# Patient Record
Sex: Male | Born: 1975 | Race: White | Hispanic: No | Marital: Single
Health system: Southern US, Academic
[De-identification: ages and names within clinical notes are randomized; demographics above are authoritative.]

## PROBLEM LIST (undated history)

## (undated) ENCOUNTER — Encounter
Payer: MEDICARE | Attending: Student in an Organized Health Care Education/Training Program | Primary: Student in an Organized Health Care Education/Training Program

## (undated) ENCOUNTER — Other Ambulatory Visit: Attending: Clinical | Primary: Clinical

## (undated) ENCOUNTER — Telehealth: Attending: Social Worker | Primary: Social Worker

## (undated) ENCOUNTER — Encounter

## (undated) ENCOUNTER — Ambulatory Visit: Payer: Medicare (Managed Care)

## (undated) ENCOUNTER — Ambulatory Visit: Attending: Social Worker | Primary: Social Worker

## (undated) ENCOUNTER — Ambulatory Visit
Payer: MEDICARE | Attending: Student in an Organized Health Care Education/Training Program | Primary: Student in an Organized Health Care Education/Training Program

## (undated) ENCOUNTER — Encounter
Attending: Student in an Organized Health Care Education/Training Program | Primary: Student in an Organized Health Care Education/Training Program

## (undated) ENCOUNTER — Ambulatory Visit

## (undated) ENCOUNTER — Telehealth

## (undated) ENCOUNTER — Encounter
Attending: Pharmacist Clinician (PhC)/ Clinical Pharmacy Specialist | Primary: Pharmacist Clinician (PhC)/ Clinical Pharmacy Specialist

## (undated) ENCOUNTER — Ambulatory Visit
Payer: Medicare (Managed Care) | Attending: Student in an Organized Health Care Education/Training Program | Primary: Student in an Organized Health Care Education/Training Program

## (undated) ENCOUNTER — Ambulatory Visit: Payer: MEDICARE

## (undated) ENCOUNTER — Telehealth: Attending: Clinical | Primary: Clinical

## (undated) ENCOUNTER — Telehealth
Attending: Student in an Organized Health Care Education/Training Program | Primary: Student in an Organized Health Care Education/Training Program

## (undated) ENCOUNTER — Ambulatory Visit: Attending: Clinical | Primary: Clinical

## (undated) ENCOUNTER — Non-Acute Institutional Stay: Payer: MEDICARE

## (undated) ENCOUNTER — Telehealth: Attending: Addiction (Substance Use Disorder) | Primary: Addiction (Substance Use Disorder)

## (undated) ENCOUNTER — Ambulatory Visit: Payer: MEDICARE | Attending: Addiction (Substance Use Disorder) | Primary: Addiction (Substance Use Disorder)

## (undated) ENCOUNTER — Non-Acute Institutional Stay
Payer: MEDICARE | Attending: Student in an Organized Health Care Education/Training Program | Primary: Student in an Organized Health Care Education/Training Program

## (undated) ENCOUNTER — Telehealth: Attending: Geriatric Psychiatry | Primary: Geriatric Psychiatry

## (undated) ENCOUNTER — Ambulatory Visit: Attending: Pharmacist | Primary: Pharmacist

## (undated) ENCOUNTER — Other Ambulatory Visit

## (undated) ENCOUNTER — Encounter: Attending: Pharmacist | Primary: Pharmacist

## (undated) ENCOUNTER — Ambulatory Visit: Attending: Addiction (Substance Use Disorder) | Primary: Addiction (Substance Use Disorder)

## (undated) ENCOUNTER — Ambulatory Visit: Payer: MEDICARE | Attending: Pharmacist | Primary: Pharmacist

## (undated) ENCOUNTER — Other Ambulatory Visit: Attending: Pharmacist | Primary: Pharmacist

## (undated) DIAGNOSIS — E119 Type 2 diabetes mellitus without complications: Secondary | ICD-10-CM

## (undated) DIAGNOSIS — F329 Major depressive disorder, single episode, unspecified: Secondary | ICD-10-CM

## (undated) DIAGNOSIS — F209 Schizophrenia, unspecified: Secondary | ICD-10-CM

## (undated) DIAGNOSIS — I10 Essential (primary) hypertension: Secondary | ICD-10-CM

## (undated) DIAGNOSIS — F32A Depression, unspecified: Secondary | ICD-10-CM

## (undated) DIAGNOSIS — F09 Unspecified mental disorder due to known physiological condition: Secondary | ICD-10-CM

## (undated) DIAGNOSIS — E785 Hyperlipidemia, unspecified: Secondary | ICD-10-CM

## (undated) DIAGNOSIS — F419 Anxiety disorder, unspecified: Secondary | ICD-10-CM

## (undated) HISTORY — PX: NASAL SINUS SURGERY: SHX719

## (undated) HISTORY — PX: MIDDLE EAR SURGERY: SHX713

## (undated) HISTORY — PX: CARDIAC CATHETERIZATION: SHX172

---
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
## 2006-07-16 ENCOUNTER — Emergency Department: Payer: Self-pay | Admitting: Emergency Medicine

## 2009-10-23 ENCOUNTER — Emergency Department: Payer: Self-pay | Admitting: Emergency Medicine

## 2011-11-02 DIAGNOSIS — H902 Conductive hearing loss, unspecified: Secondary | ICD-10-CM | POA: Diagnosis present

## 2012-05-08 DIAGNOSIS — H547 Unspecified visual loss: Secondary | ICD-10-CM

## 2012-06-29 DIAGNOSIS — F250 Schizoaffective disorder, bipolar type: Secondary | ICD-10-CM | POA: Diagnosis present

## 2017-02-09 ENCOUNTER — Emergency Department
Admission: EM | Admit: 2017-02-09 | Discharge: 2017-02-10 | Disposition: A | Discharge status: Home / Self Care | Payer: MEDICARE | Source: Intra-hospital

## 2017-02-09 ENCOUNTER — Emergency Department
Admission: EM | Admit: 2017-02-09 | Discharge: 2017-02-10 | Disposition: A | Discharge status: Home / Self Care | Payer: MEDICARE | Source: Intra-hospital | Attending: Registered Nurse | Admitting: Registered Nurse

## 2017-02-09 DIAGNOSIS — R319 Hematuria, unspecified: Principal | ICD-10-CM

## 2017-02-10 MED ORDER — NITROFURANTOIN MONOHYDRATE/MACROCRYSTALS 100 MG CAPSULE
ORAL_CAPSULE | Freq: Two times a day (BID) | ORAL | 0 refills | 0 days | Status: CP
Start: 2017-02-10 — End: 2017-02-17

## 2017-02-10 MED ORDER — DIVALPROEX ER 500 MG TABLET,EXTENDED RELEASE 24 HR
ORAL_TABLET | Freq: Every day | ORAL | 0 refills | 0 days | Status: CP
Start: 2017-02-10 — End: 2017-06-28

## 2017-02-10 MED ORDER — ATORVASTATIN 80 MG TABLET
ORAL_TABLET | Freq: Every day | ORAL | 0 refills | 0.00000 days | Status: CP
Start: 2017-02-10 — End: 2017-07-14

## 2017-02-10 MED ORDER — AMLODIPINE 10 MG TABLET
ORAL_TABLET | Freq: Every day | ORAL | 0 refills | 0 days | Status: CP
Start: 2017-02-10 — End: 2017-07-14

## 2017-02-10 MED ORDER — METFORMIN 1,000 MG TABLET
ORAL_TABLET | Freq: Two times a day (BID) | ORAL | 0 refills | 0 days | Status: CP
Start: 2017-02-10 — End: 2017-07-14

## 2017-02-11 ENCOUNTER — Emergency Department
Admission: EM | Admit: 2017-02-11 | Discharge: 2017-02-14 | Disposition: A | Discharge status: Other Acute Inpatient Hospital | Payer: Self-pay | Attending: Emergency Medicine | Admitting: Emergency Medicine

## 2017-02-11 DIAGNOSIS — F23 Brief psychotic disorder: Secondary | ICD-10-CM

## 2017-02-11 DIAGNOSIS — E785 Hyperlipidemia, unspecified: Secondary | ICD-10-CM | POA: Diagnosis present

## 2017-02-11 DIAGNOSIS — F17210 Nicotine dependence, cigarettes, uncomplicated: Secondary | ICD-10-CM | POA: Insufficient documentation

## 2017-02-11 DIAGNOSIS — E119 Type 2 diabetes mellitus without complications: Secondary | ICD-10-CM

## 2017-02-11 DIAGNOSIS — F29 Unspecified psychosis not due to a substance or known physiological condition: Secondary | ICD-10-CM | POA: Insufficient documentation

## 2017-02-11 DIAGNOSIS — Z91148 Patient's other noncompliance with medication regimen for other reason: Secondary | ICD-10-CM

## 2017-02-11 DIAGNOSIS — I10 Essential (primary) hypertension: Secondary | ICD-10-CM | POA: Diagnosis present

## 2017-02-11 DIAGNOSIS — F17200 Nicotine dependence, unspecified, uncomplicated: Secondary | ICD-10-CM | POA: Diagnosis present

## 2017-02-11 DIAGNOSIS — Z9114 Patient's other noncompliance with medication regimen: Secondary | ICD-10-CM | POA: Insufficient documentation

## 2017-02-11 LAB — COMPREHENSIVE METABOLIC PANEL
ALT: 19 U/L (ref 17–63)
ANION GAP: 11 (ref 5–15)
AST: 19 U/L (ref 15–41)
Albumin: 4.1 g/dL (ref 3.5–5.0)
Alkaline Phosphatase: 77 U/L (ref 38–126)
BILIRUBIN TOTAL: 0.5 mg/dL (ref 0.3–1.2)
BUN: 14 mg/dL (ref 6–20)
CHLORIDE: 103 mmol/L (ref 101–111)
CO2: 26 mmol/L (ref 22–32)
Calcium: 9.4 mg/dL (ref 8.9–10.3)
Creatinine, Ser: 1.49 mg/dL — ABNORMAL HIGH (ref 0.61–1.24)
GFR, EST NON AFRICAN AMERICAN: 56 mL/min — AB (ref >60)
Glucose, Bld: 274 mg/dL — ABNORMAL HIGH (ref 65–99)
POTASSIUM: 3.7 mmol/L (ref 3.5–5.1)
Sodium: 140 mmol/L (ref 135–145)
TOTAL PROTEIN: 7.3 g/dL (ref 6.5–8.1)

## 2017-02-11 LAB — CBC
HCT: 43.2 % (ref 40.0–52.0)
HEMOGLOBIN: 14.7 g/dL (ref 13.0–18.0)
MCH: 28.9 pg (ref 26.0–34.0)
MCHC: 34.1 g/dL (ref 32.0–36.0)
MCV: 84.7 fL (ref 80.0–100.0)
Platelets: 225 10*3/uL (ref 150–440)
RBC: 5.1 MIL/uL (ref 4.40–5.90)
RDW: 14.3 % (ref 11.5–14.5)
WBC: 14.2 10*3/uL — ABNORMAL HIGH (ref 3.8–10.6)

## 2017-02-11 LAB — URINE DRUG SCREEN, QUALITATIVE (ARMC ONLY)
Amphetamines, Ur Screen: NOT DETECTED
BARBITURATES, UR SCREEN: NOT DETECTED
BENZODIAZEPINE, UR SCRN: NOT DETECTED
COCAINE METABOLITE, UR ~~LOC~~: NOT DETECTED
Cannabinoid 50 Ng, Ur ~~LOC~~: NOT DETECTED
MDMA (Ecstasy)Ur Screen: NOT DETECTED
METHADONE SCREEN, URINE: NOT DETECTED
OPIATE, UR SCREEN: NOT DETECTED
PHENCYCLIDINE (PCP) UR S: NOT DETECTED
Tricyclic, Ur Screen: NOT DETECTED

## 2017-02-11 LAB — ACETAMINOPHEN LEVEL

## 2017-02-11 LAB — SALICYLATE LEVEL: Salicylate Lvl: <7 mg/dL (ref 2.8–30.0)

## 2017-02-11 LAB — ETHANOL

## 2017-02-11 NOTE — ED Provider Notes (Signed)
[facility] Emergency Department Provider Note  ____________________________________________  Time seen: Approximately 10:14 PM  I have reviewed the triage vital signs and the nursing notes.   HISTORY  Chief Complaint Psychiatric Evaluation   Level 5 caveat:  Portions of the history and physical were unable to be obtained due to: Poor historian  HPI Henry Shaffer is a 41 y.o. male brought to the ED under involuntary commitment for what is described as being delusional and walking in the road in an attempt to get killed by a car and medication noncompliance. Patient does admit he has been out of his medications for a month. He downplays the events of today that raised concern. Reports that he smokes 5 packs of cigarettes a day and has been drinking sometimes. Denies any hallucinations or intent to harm himself but does state "I just feel so sad." He easily becomes tearful while talking. He describes multiple conflicts with his brother recently. His account is somewhat wandering and difficult to ascertain which parts are truly which parts may be delusional.     No past medical history on file. Schizoaffective Diabetes  There are no active problems to display for this patient.    No past surgical history on file.   Prior to Admission medications   Not on File  Depakote Metformin Zoloft   Allergies Patient has no known allergies.   No family history on file.  Social History Social History  Substance Use Topics  . Smoking status: Not on file  . Smokeless tobacco: Not on file  . Alcohol use Not on file  Frequent alcohol use. Heavy smoking.  Review of Systems  Constitutional:   No fever or chills.  ENT:   No sore throat. No rhinorrhea. Cardiovascular:   No chest pain or syncope. Respiratory:   No dyspnea or cough. Gastrointestinal:   Negative for abdominal pain, vomiting and diarrhea.  Musculoskeletal:   Negative for focal pain or  swelling All other systems reviewed and are negative except as documented above in ROS and HPI.  ____________________________________________   PHYSICAL EXAM:  VITAL SIGNS: ED Triage Vitals  Enc Vitals Group     BP 02/11/17 2021 (!) 180/106     Pulse Rate 02/11/17 2021 94     Resp 02/11/17 2021 20     Temp 02/11/17 2021 98.4 F (36.9 C)     Temp Source 02/11/17 2021 Oral     SpO2 02/11/17 2021 96 %     Weight 02/11/17 2019 245 lb (111.1 kg)     Height 02/11/17 2019 6' (1.829 m)     Head Circumference --      Peak Flow --      Pain Score --      Pain Loc --      Pain Edu? --      Excl. in GC? --     Vital signs reviewed, nursing assessments reviewed.   Constitutional:   Alert and oriented. Well appearing and in no distress. Eyes:   No scleral icterus.  EOMI. No nystagmus. No conjunctival pallor. PERRL. ENT   Head:   Normocephalic and atraumatic.   Nose:   No congestion/rhinnorhea.    Mouth/Throat:   MMM, no pharyngeal erythema. No peritonsillar mass.    Neck:   No meningismus. Full ROM Hematological/Lymphatic/Immunilogical:   No cervical lymphadenopathy. Cardiovascular:   RRR. Symmetric bilateral radial and DP pulses.  No murmurs.  Respiratory:   Normal respiratory effort without tachypnea/retractions. Breath sounds  are clear and equal bilaterally. No wheezes/rales/rhonchi. Gastrointestinal:   Soft and nontender. Non distended. Normoactive bowel sounds.  No rebound, rigidity, or guarding. Genitourinary:   deferred Musculoskeletal:   Normal range of motion in all extremities. No joint effusions.  No lower extremity tenderness.  No edema. Neurologic:   Normal speech and language.  Motor grossly intact. No gross focal neurologic deficits are appreciated.   ____________________________________________    LABS (pertinent positives/negatives) (all labs ordered are listed, but only abnormal results are displayed) Labs Reviewed  COMPREHENSIVE METABOLIC PANEL  - Abnormal; Notable for the following:       Result Value   Glucose, Bld 274 (*)    Creatinine, Ser 1.49 (*)    GFR calc non Af Amer 56 (*)    All other components within normal limits  ACETAMINOPHEN LEVEL - Abnormal; Notable for the following:    Acetaminophen (Tylenol), Serum <10 (*)    All other components within normal limits  CBC - Abnormal; Notable for the following:    WBC 14.2 (*)    All other components within normal limits  ETHANOL  SALICYLATE LEVEL  URINE DRUG SCREEN, QUALITATIVE ([facility] ONLY)   ____________________________________________   EKG    ____________________________________________    RADIOLOGY  No results found.  ____________________________________________   PROCEDURES Procedures  ____________________________________________   INITIAL IMPRESSION / ASSESSMENT AND PLAN / ED COURSE  Pertinent labs & imaging results that were available during my care of the patient were reviewed by me and considered in my medical decision making (see chart for details).  Patient well appearing, no acute distress, medically stable. Has some symptoms of depression, questionable psychosis per third hand report from patient's family and involuntary commitment paperwork. We'll continue IVC pending psychiatry evaluation. We'll try to verify his medications.      ____________________________________________   FINAL CLINICAL IMPRESSION(S) / ED DIAGNOSES  Final diagnoses:  Acute psychosis  Noncompliance with medications      New Prescriptions   No medications on file     Portions of this note were generated with dragon dictation software. Dictation errors may occur despite best attempts at proofreading.    Sharman CheekStafford, Janyla Biscoe, MD 02/11/17 2218

## 2017-02-11 NOTE — ED Notes (Signed)
Clothing placed in labeled belongings bag :  camouflage flip flops, camouflage pants, green colored t-shirt, white colored chain with wooden cross placed in pant pocket.

## 2017-02-11 NOTE — BH Assessment (Signed)
Assessment Note  Henry Shaffer is an 41 y.o. male presenting [facility] the ED [facility] for delusional behavior, suicidal ideations and medication noncompliance.  According to IVC, patient ran out in the road in an attempt to get killed by oncoming traffic.  Pt denies the allegations.  He states that he lost his insurance and has not been able to obtain his medications.  He denies SI and says that he was upset with brother and "stormed off".  He says that he did not realize that there was a car coming.  He says that he was not thinking clearly and was not paying attention to where he was going.    Pt reports drinking alcohol and smoking synthetic weed.  Patient's lab work was negative for any drugs or alcohol.      Diagnosis: Depressive Disorder  Past Medical History: No past medical history on file.  No past surgical history on file.  Family History: No family history on file.  Social History:  has no tobacco, alcohol, and drug history on file.  Additional Social History:  Alcohol / Drug Use Pain Medications: See PTA Prescriptions: See PTA Over the Counter: See PTA History of alcohol / drug use?: No history of alcohol / drug abuse  CIWA: CIWA-Ar BP: (!) 180/106 Pulse Rate: 94 COWS:    Allergies: Allergies no known allergies  Home Medications:  (Not in a hospital admission)  OB/GYN Status:  No LMP for male patient.  General Assessment Data [facility] Assessment: In system Is this a Tele or Face-to-Face Assessment?: Face-to-Face Is this an Initial Assessment or a Re-assessment for this encounter?: Initial Assessment Marital status: Single Maiden name: n/a Is patient pregnant?: No Pregnancy Status: No Living Arrangements: Parent Can pt return to current living arrangement?: Yes Admission Status: Involuntary Is patient capable of signing voluntary admission?: No (Pt IVC) Referral Source: Self/Family/Friend Insurance  type: None     Crisis Care Plan Living Arrangements: Parent Legal Guardian: Other: (self) Name of Psychiatrist: Dr. Janeece RiggersSu Name of Therapist: none reported  Education Status Is patient currently in school?: No Current Grade: n/a Highest grade of school patient has completed: 12 Name of school: n/a Contact person: n/a  Risk to self with the past 6 months Suicidal Ideation: No Has patient been a risk to self within the past 6 months prior to admission? : No Suicidal Intent: No Has patient had any suicidal intent within the past 6 months prior to admission? : No Is patient at risk for suicide?: No Suicidal Plan?: No Has patient had any suicidal plan within the past 6 months prior to admission? : No Access to Means: No What has been your use of drugs/alcohol within the last 12 months?: Pt reports use of synthetic marijuana Previous Attempts/Gestures: No How many times?: 0 Other Self Harm Risks: none identified Triggers for Past Attempts: None known Intentional Self Injurious Behavior: None Family Suicide History: Yes (Pt reports his sister committed suicide in [id number]) Recent stressful life event(s): Conflict (Comment) (Conflict with brother) Persecutory voices/beliefs?: No Depression: Yes Depression Symptoms: Loss of interest in usual pleasures, Feeling worthless/self pity Substance abuse history and/or treatment for substance abuse?: No Suicide prevention information given to non-admitted patients: Not applicable  Risk to Others within the past 6 months Homicidal Ideation: No Does patient have any lifetime risk of violence toward others beyond the six months prior to admission? : No Thoughts of Harm to Others: No Current Homicidal  Intent: No Current Homicidal Plan: No Access to Homicidal Means: No Identified Victim: none identified History of harm to others?: No Assessment of Violence: None Noted Violent Behavior Description: None reported Does patient have access to  weapons?: No Criminal Charges Pending?: No Does patient have a court date: No Is patient on probation?: No  Psychosis Hallucinations: None noted Delusions: None noted  Mental Status Report Appearance/Hygiene: In scrubs Eye Contact: Good Motor Activity: Freedom of movement Speech: Logical/coherent Level of Consciousness: Alert Mood: Depressed, Pleasant Affect: Appropriate to circumstance, Anxious Anxiety Level: Minimal Thought Processes: Relevant Judgement: Partial Orientation: Person, Place, Time, Situation Obsessive Compulsive Thoughts/Behaviors: None  Cognitive Functioning Concentration: Normal Memory: Recent Intact, Remote Intact IQ: Average Insight: Fair Impulse Control: Fair Appetite: Good Weight Loss: 0 Weight Gain: 0 Sleep: No Change Total Hours of Sleep: 8 Vegetative Symptoms: None  ADLScreening [facility]) Patient's cognitive ability adequate to safely complete daily activities?: Yes Patient able to express need for assistance with ADLs?: Yes Independently performs ADLs?: Yes (appropriate for developmental age)  Prior Inpatient Therapy Prior Inpatient Therapy: No Prior Therapy Dates: n/a Prior Therapy Facilty/Provider(s): n/a Reason for Treatment: na  Prior Outpatient Therapy Prior Outpatient Therapy: Yes Prior Therapy Dates: current Prior Therapy Facilty/Provider(s): [facility] Reason for Treatment: depression Does patient have an ACCT team?: No Does patient have Intensive In-House Services?  : No Does patient have [facility] services? : No Does patient have P4CC services?: No  ADL Screening (condition at time of admission) Patient's cognitive ability adequate to safely complete daily activities?: Yes Patient able to express need for assistance with ADLs?: Yes Independently performs ADLs?: Yes (appropriate for developmental age)       Abuse/Neglect Assessment (Assessment to be complete while patient is  alone) Physical Abuse: Denies Verbal Abuse: Denies Sexual Abuse: Denies Exploitation of patient/patient's resources: Denies Self-Neglect: Denies Values / Beliefs Cultural Requests During Hospitalization: None Spiritual Requests During Hospitalization: None Consults Spiritual Care Consult Needed: No Social Work Consult Needed: No Merchant navy officer (For Healthcare) Does Patient Have a Medical Advance Directive?: No Would patient like information on creating a medical advance directive?: No - Patient declined    Additional Information 1:1 In Past 12 Months?: No CIRT Risk: No Elopement Risk: No Does patient have medical clearance?: Yes     Disposition:  Disposition Initial Assessment Completed for this Encounter: Yes Disposition of Patient: Other dispositions Other disposition(s): Other (Comment) (Pending [facility] consult)  On Site Evaluation by:   Reviewed with Physician:    Artist [place] 02/11/2017 10:26 PM

## 2017-02-11 NOTE — ED Notes (Signed)
BEHAVIORAL HEALTH ROUNDING  Patient sleeping: No.  Patient alert and oriented: yes  Behavior appropriate: Yes. ; If no, describe:  Nutrition and fluids offered: Yes  Toileting and hygiene offered: Yes  Sitter present: not applicable, Q 15 min safety rounds and observation.  Law enforcement present: Yes ODS  

## 2017-02-11 NOTE — ED Triage Notes (Signed)
When ask patient states he is here because "I drank alcohol and blacked out".  [facility] [place] under IVC (papers on the way).

## 2017-02-11 NOTE — ED Notes (Signed)
Pt reports he has been off his medications for "several weeks". Pt reports he has lost his insurance. Pt reports he has been drinking today(pt has no alcohol in his system). Pt reports he smoked pot about a week ago(none shows up in his urine drug screen) and he thinks that is what has made him act different.

## 2017-02-11 NOTE — ED Notes (Signed)

## 2017-02-11 NOTE — ED Notes (Signed)
SOC    CALLED 

## 2017-02-12 MED ORDER — ARIPIPRAZOLE 10 MG PO TABS
10.0000 mg | ORAL_TABLET | Freq: Every day | ORAL | Status: DC
  Administered 2017-02-12 – 2017-02-14 (×3): 10 mg via ORAL
  Filled 2017-02-12 (×3): qty 1

## 2017-02-12 MED ORDER — CLONIDINE HCL 0.1 MG PO TABS
ORAL_TABLET | ORAL | Status: AC
  Administered 2017-02-12: 0.1 mg via ORAL
  Filled 2017-02-12: qty 1

## 2017-02-12 MED ORDER — DIVALPROEX SODIUM ER 500 MG PO TB24
500.0000 mg | ORAL_TABLET | Freq: Every day | ORAL | Status: DC
  Administered 2017-02-12 – 2017-02-14 (×3): 500 mg via ORAL
  Filled 2017-02-12 (×3): qty 1

## 2017-02-12 MED ORDER — CLONIDINE HCL 0.1 MG PO TABS
0.1000 mg | ORAL_TABLET | Freq: Once | ORAL | Status: AC
  Administered 2017-02-12: 0.1 mg via ORAL

## 2017-02-12 MED ORDER — LISINOPRIL 5 MG PO TABS
5.0000 mg | ORAL_TABLET | Freq: Once | ORAL | Status: AC
  Administered 2017-02-12: 5 mg via ORAL
  Filled 2017-02-12: qty 1

## 2017-02-12 MED ORDER — ALBUTEROL SULFATE HFA 108 (90 BASE) MCG/ACT IN AERS
2.0000 | INHALATION_SPRAY | RESPIRATORY_TRACT | Status: DC | PRN
  Administered 2017-02-12 – 2017-02-13 (×5): 2 via RESPIRATORY_TRACT
  Filled 2017-02-12: qty 6.7

## 2017-02-12 MED ORDER — METFORMIN HCL 500 MG PO TABS
1000.0000 mg | ORAL_TABLET | Freq: Two times a day (BID) | ORAL | Status: DC
  Administered 2017-02-12 – 2017-02-14 (×6): 1000 mg via ORAL
  Filled 2017-02-12 (×6): qty 2

## 2017-02-12 MED ORDER — ATORVASTATIN CALCIUM 20 MG PO TABS
80.0000 mg | ORAL_TABLET | Freq: Every day | ORAL | Status: DC
  Administered 2017-02-12 – 2017-02-14 (×3): 80 mg via ORAL
  Filled 2017-02-12 (×3): qty 4

## 2017-02-12 MED ORDER — AMLODIPINE BESYLATE 5 MG PO TABS
10.0000 mg | ORAL_TABLET | Freq: Every day | ORAL | Status: DC
  Administered 2017-02-12 – 2017-02-14 (×3): 10 mg via ORAL
  Filled 2017-02-12 (×3): qty 2

## 2017-02-12 NOTE — ED Notes (Signed)
BEHAVIORAL HEALTH ROUNDING  Patient sleeping: No.  Patient alert and oriented: yes  Behavior appropriate: Yes. ; If no, describe:  Nutrition and fluids offered: Yes  Toileting and hygiene offered: Yes  Sitter present: not applicable, Q 15 min safety rounds and observation.  Law enforcement present: Yes ODS  

## 2017-02-12 NOTE — BH Assessment (Signed)
Followed up on referrals for inpatient psychiatric hospitalization:  [facility] [phone]): stated they did not receive referral and requested we re-send it  [facility] [phone]): denied due to severe medical needs  [facility] [phone]): will call if a bed becomes available   Duard BradySamantha Ladonte Verstraete, LMFT Triage Specialist

## 2017-02-12 NOTE — ED Notes (Signed)
Report was received from Henry B., RN; Pt. Verbalizes  complaints of, "I didn't mean what I said; I was just playing." distress; denies having S.I./Hi. Continue to monitor with 15 min. Monitoring.

## 2017-02-12 NOTE — ED Provider Notes (Signed)
-----------------------------------------   2:12 AM on 02/12/2017 -----------------------------------------  [facility]. Recommends resuming home medications. [facility].   Irean HongSung, Jade J, MD 02/12/17 [phone]

## 2017-02-12 NOTE — ED Notes (Signed)
PT IVC/ PENDING PLACEMENT  

## 2017-02-12 NOTE — ED Notes (Addendum)
Dr. Juliette AlcideMelinda was contacted about patient elevated blood pressure of 173/108 and pulse 108. Informed MD that patient was receiving amlodipine 10 mg with schedule medication. MD wants blood pressure recheck in an hour and a follow up phone called. Will recheck blood pressure in an hour.

## 2017-02-12 NOTE — ED Notes (Signed)
Patient attended to his ADL's. Pt showered without incident.

## 2017-02-12 NOTE — BH Assessment (Signed)
[facility] patient meets criteria for inpatient psychiatric hospitalization.  Referral packet for placement has been submitted to the following:  [facility]  [facility]  [facility]

## 2017-02-12 NOTE — ED Notes (Signed)
Report was received from Dorise HissElizabeth C., RN; Pt. Verbalizes  complaints of Depression; with a H/O Bipolar and Schizoaffective disorders; distress if feeling sad; and having memories of his deceased sister; who completed suicide; also verbalizes having S.I.; with running out in front of traffic; with wanting to get killed; denies having Hi. Continue to monitor with 15 min. Monitoring.

## 2017-02-12 NOTE — ED Notes (Signed)
Report given to SOC MD.  

## 2017-02-12 NOTE — ED Notes (Signed)
Patient received PM snack. 

## 2017-02-12 NOTE — ED Notes (Signed)

## 2017-02-12 NOTE — ED Notes (Signed)
Spoke with Dr. Juliette AlcideMelinda pertaining to pt elevated bp. New order for lisinopril written. Wants to wait until 12:02 to recheck bp called back with bp before administering the lisinopril. Will cont to monitor pt.

## 2017-02-12 NOTE — ED Notes (Signed)
Pt c/o of abdominal pain. Gave pt some ice water. Pt had large bm. Pt verbalize stomach feels better. Pt currently lying in bed resting.

## 2017-02-12 NOTE — ED Notes (Signed)
Called Dr. Juliette AlcideMelinda to give up date on pt blood pressure. Pt blood pressure has decreased to 172/97 and pulse 96 medication was effective. Pt c/o chest pain in the middle of chest radiating down the left side of arm. Harlon Kutner, RN ask patient to give her back the food tray and she was calling the MD to inform of new symptom. Pt stated,"I'm find now". MD aware. Will cont to monitor pt.

## 2017-02-12 NOTE — ED Notes (Signed)

## 2017-02-12 NOTE — ED Notes (Signed)
Pt bp still elavated 163/105 pulse 76. Dr. Juliette AlcideMelinda notified. Order to administer lisinopril 5mg  as previous prescribed.

## 2017-02-12 NOTE — ED Notes (Signed)
Report called to Margaret, RN in the ED BHU.  

## 2017-02-12 NOTE — ED Provider Notes (Signed)
-----------------------------------------   7:59 AM on 02/12/2017 -----------------------------------------   Blood pressure (!) 150/90, pulse 81, temperature 97.9 F (36.6 C), temperature source Oral, resp. rate 18, height 6' (1.829 m), weight 111.1 kg (245 lb), SpO2 98 %.  The patient had no acute events since last update.  Calm and cooperative at this time.  Disposition is pending Psychiatry/Behavioral Medicine team recommendations. S0C had recommended inpatient placement     Arnaldo NatalMalinda, Paul F, MD 02/12/17 [phone]

## 2017-02-13 ENCOUNTER — Emergency Department: Payer: Self-pay

## 2017-02-13 ENCOUNTER — Encounter: Payer: Self-pay | Admitting: Psychiatry

## 2017-02-13 DIAGNOSIS — I10 Essential (primary) hypertension: Secondary | ICD-10-CM | POA: Diagnosis present

## 2017-02-13 DIAGNOSIS — E119 Type 2 diabetes mellitus without complications: Secondary | ICD-10-CM

## 2017-02-13 DIAGNOSIS — F17200 Nicotine dependence, unspecified, uncomplicated: Secondary | ICD-10-CM | POA: Diagnosis present

## 2017-02-13 DIAGNOSIS — E785 Hyperlipidemia, unspecified: Secondary | ICD-10-CM | POA: Diagnosis present

## 2017-02-13 LAB — FIBRIN DERIVATIVES D-DIMER (ARMC ONLY): Fibrin derivatives D-dimer (ARMC): 187.25 (ref 0.00–499.00)

## 2017-02-13 LAB — TROPONIN I
Troponin I: 0.03 ng/mL (ref <0.03)
Troponin I: <0.03 ng/mL (ref <0.03)

## 2017-02-13 MED ORDER — IBUPROFEN 600 MG PO TABS: 600.0000 mg | ORAL_TABLET | Freq: Four times a day (QID) | ORAL | Status: DC | PRN

## 2017-02-13 MED ORDER — ACETAMINOPHEN 500 MG PO TABS
1000.0000 mg | ORAL_TABLET | Freq: Four times a day (QID) | ORAL | Status: DC | PRN
  Administered 2017-02-14: 1000 mg via ORAL
  Filled 2017-02-13: qty 2

## 2017-02-13 MED ORDER — NICOTINE 21 MG/24HR TD PT24
21.0000 mg | MEDICATED_PATCH | Freq: Once | TRANSDERMAL | Status: DC
  Administered 2017-02-13: 21 mg via TRANSDERMAL
  Filled 2017-02-13: qty 1

## 2017-02-13 NOTE — ED Notes (Signed)

## 2017-02-13 NOTE — ED Notes (Signed)
Nurse Inetta Fermoina in room drawing blood from patient

## 2017-02-13 NOTE — ED Notes (Signed)
Sandwich and soft drink given.  

## 2017-02-13 NOTE — ED Notes (Signed)
Patient in bathroom

## 2017-02-13 NOTE — ED Notes (Signed)
Date and time results received: 02/13/17  0942 (use smartphrase ".now" to insert current time)  Test: Troponin  Critical Value: 0.03  Name of Provider Notified: Dr. Dorothea GlassmanPaul Malinda  Orders Received? Or Actions Taken?: Instructed to send pt to the main ED.

## 2017-02-13 NOTE — ED Notes (Signed)
Soft drink was given to patient

## 2017-02-13 NOTE — ED Notes (Signed)
Pt states he feels better, reports "when I first came in, I felt like I was in a fog, but now I feel clearer, and better overall"

## 2017-02-13 NOTE — ED Notes (Signed)
EDP made aware of elevated BP. No new orders at this time. Patient is not complaining of any chest pain or headache at this time.

## 2017-02-13 NOTE — ED Provider Notes (Addendum)
  Clinical Course as of Feb 13 1821  Mon Feb 13, 2017  1505 Assuming care from Dr. Darnelle CatalanMalinda.  The patient has been reporting chest pain but apparently he has a history of similar as well as some nonspecific but abnormal EKG findings in the past.  His first troponin was 0.03, second was <0.03.  Given the pleuritic nature of the pain, a d-dimer is pending.  Plan is that if d-dimer is within normal limits the patient will be considered medically stable at this time and continue to await psychiatric disposition.  If the d-dimer is elevated we will proceed with CTA chest to rule out PE.  [CF]  1603 D-dimer is WNL.  Dr. Darnelle CatalanMalinda advised that he also wanted to check a chest x-ray to make sure there is no evidence of any acute abnormality on the radiographs.  I have ordered the 2 view radiographs and will will follow up on the results  [CF]  1819 Normal CXR, reassuring labs.  [facility]  [facility] PRN orders for ibuprofen and Tylenol. DG Chest 2 View [CF]    Clinical Course User Index [CF] Loleta RoseForbach, Darnell Jeschke, MD      Loleta RoseForbach, Myli Pae, MD 02/13/17 Rickey [id number]    ED ECG REPORT I, Loleta RoseFORBACH, Telesia Ates, the attending physician, personally viewed and interpreted this ECG.  Date: 02/13/2017 EKG Time: 13:07 Rate: 82 Rhythm: normal sinus rhythm QRS Axis: normal Intervals: normal ST/T Wave abnormalities: Non-specific ST segment / T-wave changes, but no evidence of acute ischemia. Narrative Interpretation: no evidence of acute ischemia, no significant changes compared to prior ECG this morning     Loleta RoseForbach, Syble Picco, MD 02/13/17 [id number]

## 2017-02-13 NOTE — Progress Notes (Signed)
Pt ambulatory to BHU from the QUAD with a steady gait accompanied by assigned nurse. Per nursing report and chart review, Troponin 1 level was rechecked (WNL), X-ray done as well (see results). Pt remains anxious, oncoming shift notified. Started on nicotine patch as ordered and applied to left arm. Routine safety checks maintained on unit without self harm gestures.

## 2017-02-13 NOTE — Progress Notes (Signed)
Report called to receiving nurse Tina Carr, RN. [facility] in the main ED for further observation and then a recheck on his Troponin 1 level. Pt was transported by Clinical research associatewriter to room 23 via wheelchair and assisted in bed. Warm blankets X2 given. Safety maintained.

## 2017-02-13 NOTE — ED Notes (Signed)
Received report from [facility] RN, care assumed.  Pt will be brought to ED and placed in [facility].

## 2017-02-13 NOTE — ED Provider Notes (Addendum)
-----------------------------------------   7:42 AM on 02/13/2017 -----------------------------------------   Blood pressure (!) 143/95, pulse 70, temperature 98 F (36.7 C), temperature source Oral, resp. rate 18, height 6' (1.829 m), weight 111.1 kg (245 lb), SpO2 100 %.  The patient had no acute events since last update.  Calm and cooperative at this time.  Disposition is pending Psychiatry/Behavioral Medicine team recommendations.     Arnaldo NatalMalinda, Paul F, MD 02/13/17 [phone] Nurse calls and reports patient has been complaining of some chest pain we will check an EKG and a troponin. His blood pressures up and coming down since he got his lisinopril. Apparently the nurse thinks lisinopril is mostly wants one time when it's actually once a day.   Arnaldo NatalMalinda, Paul F, MD 02/13/17 0800

## 2017-02-13 NOTE — ED Notes (Signed)
Patient in room visiting with his mother
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
## 2017-02-13 NOTE — Progress Notes (Addendum)
Pt A & O X3. Presents with blunted affect and anxious mood. Denies SI, HI, AVH and pain when assessed. However, pt's BP remains elevated. Dr. Dorothea GlassmanPaul Malinda made aware and was informed about pt receiving a one time dose of Lisinopril 5 mg PO at 1210 yesterday and the need to do further testing due to sustained elevated BP readings and pt's c/o radiating pain to his left arm as per nursing report this AM. New order received for Troponin 1 and EKG. Troponin 1 result came back elevated at 0.03 and MD was informed of critical lab value. Scheduled medications administered as prescribed and effects monitored. Pt has been compliant with medications, lab and was cooperative with EKG procedure as well. Verbalized being "woozy and tired". Educated on the need to change position slowly to prevent falls and pt verbalized understanding. Routine safety checks maintained without outburst or self harm gestures thus far.

## 2017-02-13 NOTE — ED Provider Notes (Addendum)
Patient reports some chest pain. Comes and goes doesn't seem to last 15 minutes or so. Seems to be worse with palpation and deep breathing. Palpation seems to reproduce the pain. Patient is not very certain of any of this. Patient has had chest pain in the past. [facility] show repeated episodes of troponins in the range of about 0.03. Second troponin here is negative. We'll repeat that EKG and do a d-dimer and a chest x-ray since he also has pain with deep breathing.   Arnaldo NatalMalinda, Bearett Porcaro F, MD 02/13/17 1335    Arnaldo NatalMalinda, Maddyx Wieck F, MD 02/13/17 1536

## 2017-02-13 NOTE — ED Notes (Signed)
Pt. Alert and oriented, warm and dry, in no distress. Pt. Denies SI, HI, and AVH. Pt denies pain. Pt. Encouraged to let nursing staff know of any concerns or needs.

## 2017-02-13 NOTE — ED Notes (Addendum)
Patient  On phone talking to his mother

## 2017-02-14 ENCOUNTER — Encounter: Payer: Self-pay | Admitting: *Deleted

## 2017-02-14 ENCOUNTER — Inpatient Hospital Stay
Admission: AD | Admit: 2017-02-14 | Discharge: 2017-02-19 | DRG: 885 | Disposition: A | Discharge status: Home / Self Care | Payer: Medicare HMO | Attending: Psychiatry | Admitting: Psychiatry

## 2017-02-14 DIAGNOSIS — G8929 Other chronic pain: Secondary | ICD-10-CM | POA: Diagnosis present

## 2017-02-14 DIAGNOSIS — K580 Irritable bowel syndrome with diarrhea: Secondary | ICD-10-CM | POA: Diagnosis present

## 2017-02-14 DIAGNOSIS — Z882 Allergy status to sulfonamides status: Secondary | ICD-10-CM

## 2017-02-14 DIAGNOSIS — Z885 Allergy status to narcotic agent status: Secondary | ICD-10-CM | POA: Diagnosis not present

## 2017-02-14 DIAGNOSIS — I10 Essential (primary) hypertension: Secondary | ICD-10-CM | POA: Diagnosis present

## 2017-02-14 DIAGNOSIS — Z23 Encounter for immunization: Secondary | ICD-10-CM | POA: Diagnosis present

## 2017-02-14 DIAGNOSIS — Z7982 Long term (current) use of aspirin: Secondary | ICD-10-CM

## 2017-02-14 DIAGNOSIS — Z79899 Other long term (current) drug therapy: Secondary | ICD-10-CM

## 2017-02-14 DIAGNOSIS — G4700 Insomnia, unspecified: Secondary | ICD-10-CM | POA: Diagnosis present

## 2017-02-14 DIAGNOSIS — F17200 Nicotine dependence, unspecified, uncomplicated: Secondary | ICD-10-CM | POA: Diagnosis present

## 2017-02-14 DIAGNOSIS — E785 Hyperlipidemia, unspecified: Secondary | ICD-10-CM | POA: Diagnosis present

## 2017-02-14 DIAGNOSIS — Z7984 Long term (current) use of oral hypoglycemic drugs: Secondary | ICD-10-CM

## 2017-02-14 DIAGNOSIS — Z888 Allergy status to other drugs, medicaments and biological substances status: Secondary | ICD-10-CM

## 2017-02-14 DIAGNOSIS — F1290 Cannabis use, unspecified, uncomplicated: Secondary | ICD-10-CM | POA: Diagnosis present

## 2017-02-14 DIAGNOSIS — Z818 Family history of other mental and behavioral disorders: Secondary | ICD-10-CM | POA: Diagnosis not present

## 2017-02-14 DIAGNOSIS — E119 Type 2 diabetes mellitus without complications: Secondary | ICD-10-CM | POA: Diagnosis present

## 2017-02-14 DIAGNOSIS — Z9114 Patient's other noncompliance with medication regimen: Secondary | ICD-10-CM

## 2017-02-14 DIAGNOSIS — J45909 Unspecified asthma, uncomplicated: Secondary | ICD-10-CM | POA: Diagnosis present

## 2017-02-14 DIAGNOSIS — F250 Schizoaffective disorder, bipolar type: Secondary | ICD-10-CM | POA: Diagnosis present

## 2017-02-14 DIAGNOSIS — H902 Conductive hearing loss, unspecified: Secondary | ICD-10-CM | POA: Diagnosis present

## 2017-02-14 DIAGNOSIS — H547 Unspecified visual loss: Secondary | ICD-10-CM | POA: Diagnosis present

## 2017-02-14 DIAGNOSIS — F17210 Nicotine dependence, cigarettes, uncomplicated: Secondary | ICD-10-CM | POA: Diagnosis present

## 2017-02-14 HISTORY — DX: Essential (primary) hypertension: I10

## 2017-02-14 MED ORDER — LISINOPRIL 20 MG PO TABS
10.0000 mg | ORAL_TABLET | Freq: Every day | ORAL | Status: DC
  Administered 2017-02-14 – 2017-02-15 (×2): 10 mg via ORAL
  Filled 2017-02-14 (×2): qty 1

## 2017-02-14 MED ORDER — DIVALPROEX SODIUM ER 500 MG PO TB24
500.0000 mg | ORAL_TABLET | Freq: Every day | ORAL | Status: DC
Start: 2017-02-14 — End: 2017-02-14

## 2017-02-14 MED ORDER — PNEUMOCOCCAL VAC POLYVALENT 25 MCG/0.5ML IJ INJ
0.5000 mL | INJECTION | INTRAMUSCULAR | Status: DC
  Filled 2017-02-14 (×2): qty 0.5

## 2017-02-14 MED ORDER — MAGNESIUM HYDROXIDE 400 MG/5ML PO SUSP: 30.0000 mL | Freq: Every day | ORAL | Status: DC | PRN

## 2017-02-14 MED ORDER — ATORVASTATIN CALCIUM 20 MG PO TABS
80.0000 mg | ORAL_TABLET | Freq: Every day | ORAL | Status: DC
  Administered 2017-02-14 – 2017-02-19 (×6): 80 mg via ORAL
  Filled 2017-02-14 (×7): qty 4

## 2017-02-14 MED ORDER — METFORMIN HCL 500 MG PO TABS
1000.0000 mg | ORAL_TABLET | Freq: Two times a day (BID) | ORAL | Status: DC
  Administered 2017-02-14 – 2017-02-17 (×6): 1000 mg via ORAL
  Filled 2017-02-14 (×6): qty 2

## 2017-02-14 MED ORDER — CARVEDILOL 6.25 MG PO TABS: 6.2500 mg | ORAL_TABLET | Freq: Two times a day (BID) | ORAL | Status: DC

## 2017-02-14 MED ORDER — ALBUTEROL SULFATE HFA 108 (90 BASE) MCG/ACT IN AERS
2.0000 | INHALATION_SPRAY | RESPIRATORY_TRACT | Status: DC | PRN
  Filled 2017-02-14: qty 6.7

## 2017-02-14 MED ORDER — ARIPIPRAZOLE 10 MG PO TABS
10.0000 mg | ORAL_TABLET | Freq: Every day | ORAL | Status: DC
  Administered 2017-02-14 – 2017-02-19 (×6): 10 mg via ORAL
  Filled 2017-02-14 (×6): qty 1

## 2017-02-14 MED ORDER — HYDROXYZINE HCL 25 MG PO TABS
ORAL_TABLET | ORAL | Status: AC
  Administered 2017-02-14: 50 mg via ORAL
  Filled 2017-02-14: qty 2

## 2017-02-14 MED ORDER — CLONIDINE HCL 0.1 MG PO TABS
0.1000 mg | ORAL_TABLET | Freq: Once | ORAL | Status: AC
  Administered 2017-02-14: 0.1 mg via ORAL
  Filled 2017-02-14: qty 1

## 2017-02-14 MED ORDER — ACETAMINOPHEN 325 MG PO TABS
650.0000 mg | ORAL_TABLET | Freq: Four times a day (QID) | ORAL | Status: DC | PRN
  Administered 2017-02-15 – 2017-02-18 (×2): 650 mg via ORAL
  Filled 2017-02-14 (×2): qty 2

## 2017-02-14 MED ORDER — NITROGLYCERIN 0.4 MG SL SUBL: 0.4000 mg | SUBLINGUAL_TABLET | SUBLINGUAL | Status: DC | PRN

## 2017-02-14 MED ORDER — HYDROXYZINE HCL 25 MG PO TABS
50.0000 mg | ORAL_TABLET | Freq: Once | ORAL | Status: AC
  Administered 2017-02-14: 50 mg via ORAL

## 2017-02-14 MED ORDER — NICOTINE 21 MG/24HR TD PT24
21.0000 mg | MEDICATED_PATCH | Freq: Every day | TRANSDERMAL | Status: DC
  Administered 2017-02-14 – 2017-02-19 (×6): 21 mg via TRANSDERMAL
  Filled 2017-02-14 (×6): qty 1

## 2017-02-14 MED ORDER — ALUM & MAG HYDROXIDE-SIMETH 200-200-20 MG/5ML PO SUSP
30.0000 mL | ORAL | Status: DC | PRN
  Administered 2017-02-17 (×2): 30 mL via ORAL
  Filled 2017-02-14 (×2): qty 30

## 2017-02-14 MED ORDER — DIVALPROEX SODIUM ER 500 MG PO TB24
1500.0000 mg | ORAL_TABLET | Freq: Every day | ORAL | Status: DC
  Administered 2017-02-14 – 2017-02-19 (×6): 1500 mg via ORAL
  Filled 2017-02-14 (×6): qty 3

## 2017-02-14 MED ORDER — TRAZODONE HCL 100 MG PO TABS
100.0000 mg | ORAL_TABLET | Freq: Every day | ORAL | Status: DC
  Administered 2017-02-14 – 2017-02-18 (×5): 100 mg via ORAL
  Filled 2017-02-14 (×5): qty 1

## 2017-02-14 MED ORDER — CLONIDINE HCL 0.1 MG PO TABS: 0.1000 mg | ORAL_TABLET | Freq: Three times a day (TID) | ORAL | Status: DC | PRN

## 2017-02-14 MED ORDER — AMLODIPINE BESYLATE 5 MG PO TABS
10.0000 mg | ORAL_TABLET | Freq: Every day | ORAL | Status: DC
  Administered 2017-02-14 – 2017-02-19 (×6): 10 mg via ORAL
  Filled 2017-02-14 (×6): qty 2

## 2017-02-14 NOTE — Tx Team (Signed)
Initial Treatment Plan 02/14/2017 11:46 AM Massie KluverElliot R Manzi ZOX:[id number]    PATIENT STRESSORS: Financial difficulties Health problems Marital or family conflict Medication change or noncompliance   PATIENT STRENGTHS: Ability for insight Communication skills Motivation for treatment/growth   PATIENT IDENTIFIED PROBLEMS: Schizoaffective disorder  Suicidal Ideation                   DISCHARGE CRITERIA:  Improved stabilization in mood, thinking, and/or behavior  PRELIMINARY DISCHARGE PLAN: Outpatient therapy  PATIENT/FAMILY INVOLVEMENT: This treatment plan has been presented to and reviewed with the patient, Massie Kluverlliot R Lewers, and/or family member, .  The patient and family have been given the opportunity to ask questions and make suggestions.  Shelia MediaJones, Danicia Terhaar, RN 02/14/2017, 11:46 AM
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
## 2017-02-14 NOTE — ED Provider Notes (Signed)
-----------------------------------------   6:48 AM on 02/14/2017 -----------------------------------------   Blood pressure (!) 178/104, pulse 67, temperature 98.1 F (36.7 C), temperature source Oral, resp. rate 18, height 6' (1.829 m), weight 111.1 kg (245 lb), SpO2 99 %.  The patient had no acute events since last update.  Vistaril was administered for sleep. Sleeping at this time. Disposition is pending Psychiatry/Behavioral Medicine team recommendations.     Irean HongSung, Seana Underwood J, MD 02/14/17 [phone]
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
## 2017-02-14 NOTE — [facility] Group Notes (Signed)
[facility] LCSW Group Therapy Note  Date/Time: 02/14/2017, 3:00PM  Type of Therapy/Topic:  Group Therapy:  Feelings about Diagnosis  Participation Level:  Active   Mood: Appropriate    Description of Group:    This group will allow patients to explore their thoughts and feelings about diagnoses they have received. Patients will be guided to explore their level of understanding and acceptance of these diagnoses. Facilitator will encourage patients to process their thoughts and feelings about the reactions of others to their diagnosis, and will guide patients in identifying ways to discuss their diagnosis with significant others in their lives. This group will be process-oriented, with patients participating in exploration of their own experiences as well as giving and receiving support and challenge from other group members.   Therapeutic Goals: 1. Patient will demonstrate understanding of diagnosis as evidence by identifying two or more symptoms of the disorder:  2. Patient will be able to express two feelings regarding the diagnosis 3. Patient will demonstrate ability to communicate their needs through discussion and/or role plays  Summary of Patient Progress: Patient stated feelings around mental health diagnosis. Patient stated what it means to have a mental health diagnosis and ways to effectively cope with it. Patient identified two current feelings. CSW provided support to patient.     Therapeutic Modalities:   Cognitive Behavioral Therapy Brief Therapy Feelings Identification   Hampton AbbotKadijah Enyah Moman, MSW, LCSW-A 02/14/2017, 3:38PM
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
## 2017-02-14 NOTE — ED Notes (Signed)
Pt is calm and cooperative. Writer has explained transferred to patient however; patient did not understand. Re-explain. All belongings transferred with patient. Patient denies pain, si, hi, vh, and ah prior to transferred. No other issue voice/reported prior to transferred. Pt dressed in purple scrubs. Pt transferred via wheelchair.
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
## 2017-02-14 NOTE — ED Notes (Signed)
Report given to Henrene PastorJanet, Rn. Pt will be transferred to inpatient room 302 BMU

## 2017-02-14 NOTE — Progress Notes (Signed)
   02/14/17 1412  Clinical Encounter Type  Visited With Patient;Health care provider  Visit Type Initial;Psychological support;Spiritual support;Social support;Behavioral Health  Referral From Nurse  Spiritual Encounters  Spiritual Needs Emotional  Stress Factors  Patient Stress Factors Major life changes;Health changes   Consult for patient counseling. Patient attended the end [facility]. CH spoke with patient. CH will follow-up with patient at a later time. Patient having difficulties keeping things straight and wants help to reorient himself.

## 2017-02-15 LAB — LIPID PANEL
Cholesterol: 86 mg/dL (ref 0–200)
HDL: 23 mg/dL — AB (ref >40)
LDL Cholesterol: 26 mg/dL (ref 0–99)
TRIGLYCERIDES: 184 mg/dL — AB (ref <150)
Total CHOL/HDL Ratio: 3.7 RATIO
VLDL: 37 mg/dL (ref 0–40)

## 2017-02-15 LAB — GLUCOSE, CAPILLARY: Glucose-Capillary: 107 mg/dL — ABNORMAL HIGH (ref 65–99)

## 2017-02-15 LAB — TSH: TSH: 3.127 u[IU]/mL (ref 0.350–4.500)

## 2017-02-15 MED ORDER — PNEUMOCOCCAL VAC POLYVALENT 25 MCG/0.5ML IJ INJ
0.5000 mL | INJECTION | INTRAMUSCULAR | Status: AC
  Administered 2017-02-15: 0.5 mL via INTRAMUSCULAR

## 2017-02-15 MED ORDER — LISINOPRIL 20 MG PO TABS
20.0000 mg | ORAL_TABLET | Freq: Every day | ORAL | Status: DC
  Administered 2017-02-16 – 2017-02-19 (×4): 20 mg via ORAL
  Filled 2017-02-15 (×4): qty 1

## 2017-02-15 MED ORDER — LISINOPRIL 20 MG PO TABS
10.0000 mg | ORAL_TABLET | Freq: Once | ORAL | Status: AC
  Administered 2017-02-15: 10 mg via ORAL
  Filled 2017-02-15: qty 1

## 2017-02-15 NOTE — [facility] Group Notes (Signed)
[facility] Group Notes:  (Nursing/MHT/Case Management/Adjunct)  Date:  02/15/2017  Time:  9:27 PM  Type of Therapy:  Group Therapy  Participation Level:  Active  Participation Quality:  Appropriate  Affect:  Appropriate  Cognitive:  Appropriate  Insight:  Appropriate  Engagement in Group:  Engaged  Modes of Intervention:  Activity  Summary of Progress/Problems:  Henry Shaffer 02/15/2017, 9:27 PM
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
## 2017-02-15 NOTE — Tx Team (Signed)
Interdisciplinary Treatment and Diagnostic Plan Update  02/15/2017 Time of Session: 1200 Henry Shaffer MRN: [id number]  Principal Diagnosis: Schizoaffective disorder, bipolar type [facility](HCC)  Secondary Diagnoses: Principal Problem:   Schizoaffective disorder, bipolar type (HCC) Active Problems:   HTN (hypertension)   Dyslipidemia   Diabetes (HCC)   Tobacco use disorder   Asthma   Conductive hearing loss, middle ear   Decreased vision   Current Medications:  Current Facility-Administered Medications  Medication Dose Route Frequency Provider Last Rate Last Dose  . acetaminophen (TYLENOL) tablet 650 mg  650 mg Oral Q6H PRN Pucilowska, Jolanta B, MD      . albuterol (PROVENTIL HFA;VENTOLIN HFA) 108 (90 Base) MCG/ACT inhaler 2 puff  2 puff Inhalation Q4H PRN Pucilowska, Jolanta B, MD      . alum & mag hydroxide-simeth (MAALOX/MYLANTA) 200-200-20 MG/5ML suspension 30 mL  30 mL Oral Q4H PRN Pucilowska, Jolanta B, MD      . amLODipine (NORVASC) tablet 10 mg  10 mg Oral Daily Pucilowska, Jolanta B, MD   10 mg at 02/15/17 0831  . ARIPiprazole (ABILIFY) tablet 10 mg  10 mg Oral Daily Pucilowska, Jolanta B, MD   10 mg at 02/15/17 0831  . atorvastatin (LIPITOR) tablet 80 mg  80 mg Oral Daily Pucilowska, Jolanta B, MD   80 mg at 02/15/17 0831  . cloNIDine (CATAPRES) tablet 0.1 mg  0.1 mg Oral TID PRN Jimmy FootmanHernandez-Gonzalez, Andrea, MD      . divalproex (DEPAKOTE ER) 24 hr tablet 1,500 mg  1,500 mg Oral Daily Jimmy FootmanHernandez-Gonzalez, Andrea, MD   1,500 mg at 02/15/17 0831  . [START ON 02/16/2017] lisinopril (PRINIVIL,ZESTRIL) tablet 20 mg  20 mg Oral Daily Jimmy FootmanHernandez-Gonzalez, Andrea, MD      . magnesium hydroxide (MILK OF MAGNESIA) suspension 30 mL  30 mL Oral Daily PRN Pucilowska, Jolanta B, MD      . metFORMIN (GLUCOPHAGE) tablet 1,000 mg  1,000 mg Oral BID Pucilowska, Jolanta B, MD   1,000 mg at 02/15/17 0831  . nicotine (NICODERM CQ - dosed in mg/24 hours) patch 21 mg  21 mg Transdermal Q0600 Pucilowska,  Jolanta B, MD   21 mg at 02/15/17 0800  . nitroGLYCERIN (NITROSTAT) SL tablet 0.4 mg  0.4 mg Sublingual Q5 min PRN Jimmy FootmanHernandez-Gonzalez, Andrea, MD      . pneumococcal 23 valent vaccine (PNU-IMMUNE) injection 0.5 mL  0.5 mL Intramuscular Tomorrow-1000 Jimmy FootmanHernandez-Gonzalez, Andrea, MD      . traZODone (DESYREL) tablet 100 mg  100 mg Oral QHS Pucilowska, Jolanta B, MD   100 mg at 02/14/17 2125   PTA Medications: Prescriptions Prior to Admission  Medication Sig Dispense Refill Last Dose  . albuterol (PROVENTIL HFA;VENTOLIN HFA) 108 (90 Base) MCG/ACT inhaler Inhale 2 puffs into the lungs every 6 (six) hours as needed for wheezing or shortness of breath.    PRN at PRN  . amLODipine (NORVASC) 10 MG tablet Take 10 mg by mouth daily.    unknown at unknown  . aspirin 81 MG chewable tablet Chew 81 mg by mouth daily.    unknown at unknown  . atorvastatin (LIPITOR) 80 MG tablet Take 80 mg by mouth daily.    unknown at unknown  . divalproex (DEPAKOTE ER) 500 MG 24 hr tablet Take 500 mg by mouth daily.    unknown at unknown  . metFORMIN (GLUCOPHAGE) 1000 MG tablet Take 1,000 mg by mouth 2 (two) times daily.    unknown at unknown    Patient Stressors: Financial difficulties Health  problems Marital or family conflict Medication change or noncompliance  Patient Strengths: Ability for insight Communication skills Motivation for treatment/growth  Treatment Modalities: Medication Management, Group therapy, Case management,  1 to 1 session with clinician, Psychoeducation, Recreational therapy.   Physician Treatment Plan for Primary Diagnosis: Schizoaffective disorder, bipolar type (HCC) Long Term Goal(s): Improvement in symptoms so as ready for discharge Improvement in symptoms so as ready for discharge   Short Term Goals: Ability to identify changes in lifestyle to reduce recurrence of condition will improve Ability to demonstrate self-control will improve Compliance with prescribed medications will  improve Ability to identify and develop effective coping behaviors will improve  Medication Management: Evaluate patient's response, side effects, and tolerance of medication regimen.  Therapeutic Interventions: 1 to 1 sessions, Unit Group sessions and Medication administration.  Evaluation of Outcomes: Progressing  Physician Treatment Plan for Secondary Diagnosis: Principal Problem:   Schizoaffective disorder, bipolar type (HCC) Active Problems:   HTN (hypertension)   Dyslipidemia   Diabetes (HCC)   Tobacco use disorder   Asthma   Conductive hearing loss, middle ear   Decreased vision  Long Term Goal(s): Improvement in symptoms so as ready for discharge Improvement in symptoms so as ready for discharge   Short Term Goals: Ability to identify changes in lifestyle to reduce recurrence of condition will improve Ability to demonstrate self-control will improve Compliance with prescribed medications will improve Ability to identify and develop effective coping behaviors will improve     Medication Management: Evaluate patient's response, side effects, and tolerance of medication regimen.  Therapeutic Interventions: 1 to 1 sessions, Unit Group sessions and Medication administration.  Evaluation of Outcomes: Progressing   RN Treatment Plan for Primary Diagnosis: Schizoaffective disorder, bipolar type (HCC) Long Term Goal(s): Knowledge of disease and therapeutic regimen to maintain health will improve  Short Term Goals: Ability to identify and develop effective coping behaviors will improve and Compliance with prescribed medications will improve  Medication Management: RN will administer medications as ordered by provider, will assess and evaluate patient's response and provide education to patient for prescribed medication. RN will report any adverse and/or side effects to prescribing provider.  Therapeutic Interventions: 1 on 1 counseling sessions, Psychoeducation, Medication  administration, Evaluate responses to treatment, Monitor vital signs and CBGs as ordered, Perform/monitor CIWA, COWS, AIMS and Fall Risk screenings as ordered, Perform wound care treatments as ordered.  Evaluation of Outcomes: Progressing   LCSW Treatment Plan for Primary Diagnosis: Schizoaffective disorder, bipolar type (HCC) Long Term Goal(s): Safe transition to appropriate next level of care at discharge, Engage patient in therapeutic group addressing interpersonal concerns.  Short Term Goals: Engage patient in aftercare planning with referrals and resources and Increase skills for wellness and recovery  Therapeutic Interventions: Assess for all discharge needs, 1 to 1 time with Social worker, Explore available resources and support systems, Assess for adequacy in community support network, Educate family and significant other(s) on suicide prevention, Complete Psychosocial Assessment, Interpersonal group therapy.  Evaluation of Outcomes: Progressing   Progress in Treatment: Attending groups: Yes. Participating in groups: Yes. Taking medication as prescribed: Yes. Toleration medication: Yes. Family/Significant other contact made: No, will contact:  mother Patient understands diagnosis: Yes. Discussing patient identified problems/goals with staff: Yes. Medical problems stabilized or resolved: Yes. Denies suicidal/homicidal ideation: Yes. Issues/concerns per patient self-inventory: No. Other: none  New problem(s) identified: No, Describe:  none  New Short Term/Long Term Goal(s): Pt goal: "to learn coping skills for my emotions."  Discharge Plan or Barriers: CSW  assessing for appropriate plan.  Reason for Continuation of Hospitalization: Medication stabilization  Estimated Length of Stay: 3-5 days.  Attendees: Patient:Henry Shaffer 02/15/2017   Physician: Dr. Ardyth Harps, MD 02/15/2017  Nursing:  02/15/2017   RN Care Manager: 02/15/2017   Social Worker: Daleen Squibb, LCSW  02/15/2017   Recreational Therapist:  02/15/2017   Other: Andrey Spearman, PA Student 02/15/2017   Other:  02/15/2017   Other: 02/15/2017     Scribe for Treatment Team: Lorri Frederick, LCSW 02/15/2017 1:15 PM

## 2017-02-15 NOTE — Plan of Care (Signed)
Problem: Coping: Goal: Ability to cope will improve Outcome: Progressing Improved coping exhibited, verbalized frustrations and calmed self

## 2017-02-15 NOTE — Progress Notes (Signed)
   02/15/17 1340  Clinical Encounter Type  Visited With Patient;Other (Comment) (Group)  Visit Type Spiritual support;Behavioral Health  Referral From Chaplain  Spiritual Encounters  Spiritual Needs Emotional;Grief support  Stress Factors  Patient Stress Factors Major life changes;Health changes;Financial concerns;Family relationships   Patient attended afternoon group. Patient stated he is experiencing grief over the loss of his sister many years ago, his uncle, and a few others. Patient stated he is uncomfortable in social situations as he feels he lacks the skills to interact with others. Patient would like help in learning to be more social and grief support to assist with his feelings of loss. Patient spoke slow during group and cried when speaking about his losses. Patient stated he had issues with anger and would like to learn a more productive way to deal with emotional issues. Patient stated he ran out in traffic and stated it was not the best decision. The patient would like someone to counsel him and to work with him through his issues. Chaplain will follow-up with patient. Chaplain provided emotional support and empathetic listening.

## 2017-02-15 NOTE — Progress Notes (Signed)
Patient ID: Henry Shaffer, male   DOB: 09-Jun-1976, 41 y.o.   MRN: [id number] Visitation by mother and brother went well according to patient; pleasant, denied SI/HI/AVH, complied with medications.

## 2017-02-15 NOTE — Plan of Care (Signed)
Problem: Activity: Goal: Sleeping patterns will improve Outcome: Progressing Patient slept for Estimated Hours of 6; Precautionary checks every 15 minutes for safety maintained, room free of safety hazards, patient sustains no injury or falls during this shift.    

## 2017-02-15 NOTE — [facility] Group Notes (Signed)
[facility] LCSW Group Therapy   02/15/2017  1:00 pm   Type of Therapy: Group Therapy   Participation Level: Active   Participation Quality: Attentive, Sharing and Supportive   Affect: Depressed   Cognitive: Alert and Oriented   Insight: Developing/Improving and Engaged   Engagement in Therapy: Developing/Improving and Engaged   Modes of Intervention: Clarification, Confrontation, Discussion, Education, Exploration, Limit-setting, Orientation, Problem-solving, Rapport Building, Dance movement psychotherapisteality Testing, Socialization and Support   Summary of Progress/Problems: The topic for group today was emotional regulation. This group focused on both positive and negative emotion identification and allowed  group members to process ways to identify feelings, regulate negative emotions, and find healthy ways to manage internal/external emotions. Group members were asked to reflect on a time when their reaction to an emotion led to a negative outcome and explored how alternative responses using emotion regulation would have benefited them. Group members were also asked to discuss a time when emotion regulation was utilized when a negative emotion was experienced. Patient defined what emotion regulation meant to him. Patient identified emotions that he felt before being admitted to the hospital and the behavior that came with it. He also identified coping mechanisms or opposite actions in order to regulate those negative emotions.    Hampton AbbotKadijah Myosha Cuadras, MSW, LCSW-A 02/15/2017, 3:07PM

## 2017-02-16 LAB — HEMOGLOBIN A1C
Hgb A1c MFr Bld: 8.9 % — ABNORMAL HIGH (ref 4.8–5.6)
Mean Plasma Glucose: 209 mg/dL

## 2017-02-16 MED ORDER — HYDROXYZINE HCL 25 MG PO TABS
25.0000 mg | ORAL_TABLET | Freq: Once | ORAL | Status: AC
  Administered 2017-02-16: 25 mg via ORAL

## 2017-02-16 MED ORDER — CLONIDINE HCL 0.1 MG PO TABS
0.1000 mg | ORAL_TABLET | Freq: Two times a day (BID) | ORAL | Status: DC
  Administered 2017-02-16 – 2017-02-17 (×3): 0.1 mg via ORAL
  Filled 2017-02-16 (×3): qty 1

## 2017-02-16 NOTE — Progress Notes (Signed)
Patient is anxious, hyper religious-preoccupied with the bible. States"there is something going with my body."  pt said "the bible says to take out my seed but I don't know how to." pt states its something sexual. Dr. Daleen Boavi notified. N.O. Vistaril 25 mg po once for anxiety. q   15 min checks maintained for safety. Will continue to monitor behavior.

## 2017-02-16 NOTE — [facility] Group Notes (Signed)
[facility] LCSW Group Therapy Note  Date/Time: 02/16/17, 1300  Type of Therapy/Topic:  Group Therapy:  Balance in Life  Participation Level:  active  Description of Group:    This group will address the concept of balance and how it feels and looks when one is unbalanced. Patients will be encouraged to process areas in their lives that are out of balance, and identify reasons for remaining unbalanced. Facilitators will guide patients utilizing problem- solving interventions to address and correct the stressor making their life unbalanced. Understanding and applying boundaries will be explored and addressed for obtaining  and maintaining a balanced life. Patients will be encouraged to explore ways to assertively make their unbalanced needs known to significant others in their lives, using other group members and facilitator for support and feedback.  Therapeutic Goals: 1. Patient will identify two or more emotions or situations they have that consume much of in their lives. 2. Patient will identify signs/triggers that life has become out of balance:  3. Patient will identify two ways to set boundaries in order to achieve balance in their lives:  4. Patient will demonstrate ability to communicate their needs through discussion and/or role plays  Summary of Patient Progress: Pt made a number of contributions to group discussion today.  Identified physical, social, and mental health as areas that are out of balance.  Pt discussed lack of friendships as being a problem currently.  Pt took part in a discussion about how to recognize when things are out of balance and steps to bring things back in line when needed.          Therapeutic Modalities:   Cognitive Behavioral Therapy Solution-Focused Therapy Assertiveness Training  Daleen SquibbGreg Tokiko Diefenderfer, [place]CS[place]

## 2017-02-16 NOTE — Progress Notes (Signed)
Pt calm, cooperative and pleasant.  Continues to report that he feels "woozy."  Denies dizziness, lightheadedness or any other symptoms.  Just reports he wants to lay down, "I'm just getting used to my meds."  Pt signed voluntary commitment then quickly signed 72 hr request for discharge at 1200 pm.  Dr. Ardyth HarpsHernandez notified.  Pt reports he is ready to go home, "My mood is great."  Denies SI/HI/AVH. No behavioral issues.

## 2017-02-16 NOTE — Progress Notes (Signed)
[facility] Progress Note  02/16/2017 9:30 AM Henry Shaffer  MRN:  [id number] Subjective:  Patient is a 41 year old Caucasian male with diagnosis of schizoaffective disorder bipolar type. The patient presented to our emergency Department on July 21 on the IVC.  Patient reported that he had lost his insurance several months ago. He has not been able to consistently take his medications. He has been going to see his psychiatrist [facility] in [place] where he has been getting samples.  The IVC paperwork and the patient was described as delusional and dangerous to self as he was walking in the road trying to get hit by traffic.   7/25 patient reports that he feels much better. He is slept very well last night. He denies problems with energy, appetite or concentration. He says he is not longer having racing thoughts. He feels that his mood is level. He denies side effects from medications or having any physical complaints. He denies having suicidality, homicidality or auditory or visual hallucinations.  7/26 patient reports he is doing "fantastic". He said he slept really well last night. He is tolerating medications very well. He feels that the medications are helping his mood. He described his mood as "great". He denies problems with appetite, energy or concentration. He denies any side effects. He denies any physical complaints. He signed a 72 hour request for discharge today at 12.   Per nursing: Patient slept for Estimated Hours of 6.15; Precautionary checks every 15 minutes for safety maintained, room free of safety hazards, patient sustains no injury or falls during this shift.  Visitation by mother, [name], went well; improved appearance, some fidgety/nervousness noted, c/o IBS flares up, unwitnessed loose stool, will allow assessment next time before flushing. Medication compliant, denied SI/HI/AVH.  Principal Problem: Schizoaffective disorder, bipolar type (HCC) Diagnosis:   Patient  Active Problem List   Diagnosis Date Noted  . Asthma [J45.909] 02/14/2017  . HTN (hypertension) [I10] 02/13/2017  . Dyslipidemia [E78.5] 02/13/2017  . Diabetes (HCC) [E11.9] 02/13/2017  . Tobacco use disorder [F17.200] 02/13/2017  . Schizoaffective disorder, bipolar type (HCC) [F25.0] 06/29/2012  . Decreased vision [H54.7] 05/08/2012  . 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 [facility] in [place]. No prior suicidal attempts  Past Medical History:  Past Medical History:  Diagnosis Date  . Hypertension    History reviewed. No pertinent surgical history.  Family History: History reviewed. No pertinent family history.  Family Psychiatric  History: Patient's sister committed suicide in [id number]. He has an aunt who has been diagnosed with schizophrenia  Tobacco Screening: Have you used any form of tobacco in the last 30 days? (Cigarettes, Smokeless Tobacco, Cigars, and/or Pipes): Yes Tobacco use, Select all that apply: 5 or more cigarettes per day Are you interested in Tobacco Cessation Medications?: Yes, will notify MD for an order Counseled patient on smoking cessation including recognizing danger situations, developing coping skills and basic information about quitting provided: Yes   Social History: [place] lives [place] mother in [place]. He is single, never married, doesn't have any children, he is disabled due to mental illness. He has a 12th grade education. He has special aid classes.  Denies any legal history     History    History  Alcohol Use  . 0.6 oz/week  .  1 Cans of beer per week    Comment: occassionally     History  Drug Use  . Types: Marijuana    Comment: liquid marijuana 2 weeks ago    Social History   Social History  .  Marital status: Single    Spouse name: N/A  . Number of children: N/A  . Years of education: N/A   Social History Main Topics  . Smoking status: Current Every Day Smoker    Packs/day: 2.00  . Smokeless tobacco: Never Used  . Alcohol use 0.6 oz/week    1 Cans of beer per week     Comment: occassionally  . Drug use: Yes    Types: Marijuana     Comment: liquid marijuana 2 weeks ago  . Sexual activity: No   Other Topics Concern  . None   Social History Narrative  . None     Current Medications: Current Facility-Administered Medications  Medication Dose Route Frequency Provider Last Rate Last Dose  . acetaminophen (TYLENOL) tablet 650 mg  650 mg Oral Q6H PRN Pucilowska, Jolanta B, MD   650 mg at 02/15/17 1838  . albuterol (PROVENTIL HFA;VENTOLIN HFA) 108 (90 Base) MCG/ACT inhaler 2 puff  2 puff Inhalation Q4H PRN Pucilowska, Jolanta B, MD      . alum & mag hydroxide-simeth (MAALOX/MYLANTA) 200-200-20 MG/5ML suspension 30 mL  30 mL Oral Q4H PRN Pucilowska, Jolanta B, MD      . amLODipine (NORVASC) tablet 10 mg  10 mg Oral Daily Pucilowska, Jolanta B, MD   10 mg at 02/16/17 0844  . ARIPiprazole (ABILIFY) tablet 10 mg  10 mg Oral Daily Pucilowska, Jolanta B, MD   10 mg at 02/16/17 0844  . atorvastatin (LIPITOR) tablet 80 mg  80 mg Oral Daily Pucilowska, Jolanta B, MD   80 mg at 02/16/17 0844  . cloNIDine (CATAPRES) tablet 0.1 mg  0.1 mg Oral TID PRN Jimmy FootmanHernandez-Gonzalez, Oralia Criger, MD      . divalproex (DEPAKOTE ER) 24 hr tablet 1,500 mg  1,500 mg Oral Daily Jimmy FootmanHernandez-Gonzalez, Stepheny Canal, MD   1,500 mg at 02/16/17 0844  . lisinopril (PRINIVIL,ZESTRIL) tablet 20 mg  20 mg Oral Daily Jimmy FootmanHernandez-Gonzalez, Norine Reddington, MD   20 mg at 02/16/17 0844  . magnesium hydroxide (MILK OF MAGNESIA) suspension 30 mL  30 mL Oral Daily PRN Pucilowska, Jolanta B, MD      . metFORMIN (GLUCOPHAGE) tablet 1,000 mg  1,000 mg Oral BID Pucilowska, Jolanta B, MD   1,000 mg at 02/16/17 0844  . nicotine (NICODERM CQ - dosed in  mg/24 hours) patch 21 mg  21 mg Transdermal Q0600 Pucilowska, Jolanta B, MD   21 mg at 02/16/17 0844  . nitroGLYCERIN (NITROSTAT) SL tablet 0.4 mg  0.4 mg Sublingual Q5 min PRN Jimmy FootmanHernandez-Gonzalez, Anisha Starliper, MD      . traZODone (DESYREL) tablet 100 mg  100 mg Oral QHS Pucilowska, Jolanta B, MD   100 mg at 02/15/17 2125    Lab Results:  Results for orders placed or performed during the hospital encounter of 02/14/17 (from the past 48 hour(s))  Hemoglobin A1c     Status: Abnormal   Collection Time: 02/15/17  6:48 AM  Result Value Ref Range   Hgb A1c MFr Bld 8.9 (H) 4.8 - 5.6 %    Comment: (NOTE)         Pre-diabetes: 5.7 - 6.4         Diabetes: >6.4         Glycemic control  for adults with diabetes: <7.0    Mean Plasma Glucose 209 mg/dL    Comment: (NOTE) Performed At: [facility] [address] [place], [place] [id number] Mila Homer MD WU:[id number]   Lipid panel     Status: Abnormal   Collection Time: 02/15/17  6:48 AM  Result Value Ref Range   Cholesterol 86 0 - 200 mg/dL   Triglycerides 956 (H) <150 mg/dL   HDL 23 (L) >21 mg/dL   Total CHOL/HDL Ratio 3.7 RATIO   VLDL 37 0 - 40 mg/dL   LDL Cholesterol 26 0 - 99 mg/dL    Comment:        Total Cholesterol/HDL:CHD Risk Coronary Heart Disease Risk Table                     Men   Women  1/2 Average Risk   3.4   3.3  Average Risk       5.0   4.4  2 X Average Risk   9.6   7.1  3 X Average Risk  23.4   11.0        Use the calculated Patient Ratio above and the CHD Risk Table to determine the patient's CHD Risk.        ATP III CLASSIFICATION (LDL):  <100     mg/dL   Optimal  308-657  mg/dL   Near or Above                    Optimal  130-159  mg/dL   Borderline  846-962  mg/dL   High  >952     mg/dL   Very High   TSH     Status: None   Collection Time: 02/15/17  6:48 AM  Result Value Ref Range   TSH 3.127 0.350 - 4.500 uIU/mL    Comment: Performed by a 3rd Generation assay with a functional sensitivity of  <=0.01 uIU/mL.  Glucose, capillary     Status: Abnormal   Collection Time: 02/15/17  8:30 PM  Result Value Ref Range   Glucose-Capillary 107 (H) 65 - 99 mg/dL   Comment 1 Notify RN     Blood Alcohol level:  Lab Results  Component Value Date   ETH <5 02/11/2017    Metabolic Disorder Labs: Lab Results  Component Value Date   HGBA1C 8.9 (H) 02/15/2017   MPG 209 02/15/2017   No results found for: PROLACTIN Lab Results  Component Value Date   CHOL 86 02/15/2017   TRIG 184 (H) 02/15/2017   HDL 23 (L) 02/15/2017   CHOLHDL 3.7 02/15/2017   VLDL 37 02/15/2017   LDLCALC 26 02/15/2017    Physical Findings: AIMS: Facial and Oral Movements Muscles of Facial Expression: None, normal Lips and Perioral Area: None, normal Jaw: None, normal Tongue: None, normal,Extremity Movements Upper (arms, wrists, hands, fingers): None, normal Lower (legs, knees, ankles, toes): None, normal, Trunk Movements Neck, shoulders, hips: None, normal, Overall Severity Severity of abnormal movements (highest score from questions above): None, normal Incapacitation due to abnormal movements: None, normal Patient's awareness of abnormal movements (rate only patient's report): No Awareness, Dental Status Current problems with teeth and/or dentures?: No Does patient usually wear dentures?: No  CIWA:  CIWA-Ar Total: 0 COWS:  COWS Total Score: 0  Musculoskeletal: Strength & Muscle Tone: within normal limits Gait & Station: normal Patient leans: N/A  Psychiatric Specialty Exam: Physical Exam  Constitutional: He is oriented to person, place, and  time. He appears well-developed and well-nourished.  HENT:  Head: Normocephalic and atraumatic.  Eyes: Conjunctivae and EOM are normal.  Neck: Normal range of motion.  Respiratory: Effort normal.  Musculoskeletal: Normal range of motion.  Neurological: He is alert and oriented to person, place, and time.    Review of Systems  Constitutional: Negative.    HENT: Negative.   Eyes: Negative.   Respiratory: Negative.   Cardiovascular: Negative.   Gastrointestinal: Negative.   Genitourinary: Negative.   Musculoskeletal: Negative.   Skin: Negative.   Neurological: Negative.   Endo/Heme/Allergies: Negative.   Psychiatric/Behavioral: Negative.     Blood pressure (!) 139/99, pulse 78, temperature (!) 97.5 F (36.4 C), resp. rate 18, height 6' (1.829 m), weight 105.2 kg (232 lb), SpO2 100 %.Body mass index is 31.46 kg/m.  General Appearance: Well Groomed  Eye Contact:  Good  Speech:  Clear and Coherent  Volume:  Normal  Mood:  Dysphoric  Affect:  Appropriate and Congruent  Thought Process:  Linear and Descriptions of Associations: Intact  Orientation:  Full (Time, Place, and Person)  Thought Content:  Hallucinations: None  Suicidal Thoughts:  No  Homicidal Thoughts:  No  Memory:  Immediate;   Fair Recent;   Fair Remote;   Fair  Judgement:  Poor  Insight:  Shallow  Psychomotor Activity:  Normal  Concentration:  Concentration: Fair and Attention Span: Fair  Recall:  [employer] of Knowledge:  Fair  Language:  Good  Akathisia:  No  Handed:    AIMS (if indicated):     Assets:  Manufacturing systems engineer Social Support  ADL's:  Intact  Cognition:  Impaired,  Mild  Sleep:  Number of Hours: 6.15     Treatment Plan Summary: 41 year old Caucasian male with schizoaffective disorder bipolar type. He presented to the ER due to bizarre and dangerous behavior in the setting of noncompliance with medications.  Schizoaffective disorder bipolar type the patient will be continued on Abilify 10 mg a day and Depakote ER 1500 mg daily at bedtime---He will need a Depakote level on Saturday night prior to discharge  For insomnia patient is being prescribed trazodone 100 mg by mouth daily at bedtime--- he is slept 6 hours last night  Hypertension: Continue Norvasc 10 mg a day and lisinopril 20 mg po q day.  BP is better but still  elevated.  Diabetes continue metformin 1000 mg twice a day  Chronic chest pain patient has orders for nitroglycerin when necessary---denies any chest pain  Dyslipidemia continue Lipitor 80 mg a day  Asthma continue albuterol when necessary.  Vital signs will be changed to q shift  Diet low sodium and carb modified  Precautions every 15 minute checks  Hospitalization status --will change to voluntary  Records from [facility] admission to [facility] in [facility] have been reviewed  Labs lipid panel completed, TSH within the normal limits. Pending hemoglobin A1c. Will need a Depakote level prior to discharge  Likely will be d/c home after depakote level obtained   Jimmy Footman, MD 02/16/2017, 9:30 AM

## 2017-02-16 NOTE — Plan of Care (Signed)
Problem: Education: Goal: Emotional status will improve Outcome: Progressing Reported and observed improved mood and emotional status

## 2017-02-16 NOTE — Plan of Care (Signed)
Problem: Activity: Goal: Sleeping patterns will improve Outcome: Progressing Patient slept for Estimated Hours of 6.15; Precautionary checks every 15 minutes for safety maintained, room free of safety hazards, patient sustains no injury or falls during this shift.    

## 2017-02-16 NOTE — [facility] Suicide Risk Assessment (Signed)
[facility] INPATIENT:  Family/Significant Other Suicide Prevention Education  Suicide Prevention Education:  Education Completed; Zada Girtatricia Mashburn, mother, [phone], has been identified by the patient as the family member/significant other with whom the patient will be residing, and identified as the person(s) who will aid the patient in the event of a mental health crisis (suicidal ideations/suicide attempt).  With written consent from the patient, the family member/significant other has been provided the following suicide prevention education, prior to the and/or following the discharge of the patient.  The suicide prevention education provided includes the following:  Suicide risk factors  Suicide prevention and interventions  National Suicide Hotline telephone number  [facility] assessment telephone number  [facility] Emergency Assistance 911  [facility] and/or Residential Mobile Crisis Unit telephone number  Request made of family/significant other to:  Remove weapons (e.g., guns, rifles, knives), all items previously/currently identified as safety concern.  No guns in the home, per [place].  Remove drugs/medications (over-the-counter, prescriptions, illicit drugs), all items previously/currently identified as a safety concern. Elease Hashimotoatricia agreed to check the status of meds in the home.  The family member/significant other verbalizes understanding of the suicide prevention education information provided.  The family member/significant other agrees to remove the items of safety concern listed above.  Elease Hashimotoatricia reported that they have moved recently, pt missed several MD appts, and has not been taking his medicine.  She is aware that he needs it and that he will go downhill if he is off it.  She said pt does have [employer] care insurance and it should still be active.  [facility].  CSW will check if pt can return.  Lorri FrederickWierda, Amed Datta Jon,  LCSW 02/16/2017, 11:45 AM

## 2017-02-16 NOTE — Progress Notes (Signed)
Patient ID: Henry Shaffer, male   DOB: [birth year], 41 y.o.   MRN: [id number] Visitation by mother, [name], went well; improved appearance, some fidgety/nervousness noted, c/o IBS flares up, unwitnessed loose stool, will allow assessment next time before flushing. Medication compliant, denied SI/HI/AVH.

## 2017-02-17 MED ORDER — BISMUTH SUBSALICYLATE 262 MG/15ML PO SUSP
30.0000 mL | Freq: Three times a day (TID) | ORAL | Status: DC
  Administered 2017-02-17 – 2017-02-19 (×6): 30 mL via ORAL
  Filled 2017-02-17 (×2): qty 118

## 2017-02-17 MED ORDER — LOPERAMIDE HCL 2 MG PO CAPS
4.0000 mg | ORAL_CAPSULE | ORAL | Status: DC | PRN
  Administered 2017-02-17: 4 mg via ORAL
  Filled 2017-02-17: qty 2

## 2017-02-17 NOTE — [facility] Group Notes (Signed)
[facility] LCSW Group Therapy Note  Date/Time: 02/17/17, 0930  Type of Therapy and Topic:  Group Therapy:  Feelings around Relapse and Recovery  Participation Level:  Did Not Attend   Mood:  Description of Group:    Patients in this group will discuss emotions they experience before and after a relapse. They will process how experiencing these feelings, or avoidance of experiencing them, relates to having a relapse. Facilitator will guide patients to explore emotions they have related to recovery. Patients will be encouraged to process which emotions are more powerful. They will be guided to discuss the emotional reaction significant others in their lives may have to patients' relapse or recovery. Patients will be assisted in exploring ways to respond to the emotions of others without this contributing to a relapse.  Therapeutic Goals: 1. Patient will identify two or more emotions that lead to relapse for them:  2. Patient will identify two emotions that result when they relapse:  3. Patient will identify two emotions related to recovery:  4. Patient will demonstrate ability to communicate their needs through discussion and/or role plays.   Summary of Patient Progress:     Therapeutic Modalities:   Cognitive Behavioral Therapy Solution-Focused Therapy Assertiveness Training Relapse Prevention Therapy  Greg Quilla Freeze, LCSW       

## 2017-02-17 NOTE — Progress Notes (Signed)
Pt reports he wasn't able to sleep last night because "I couldn't masturbate". Pt c/o ingestion. Maalox 30 ml po given PRN. Will continue to monitor behavior.

## 2017-02-17 NOTE — Plan of Care (Signed)
Problem: Physical Regulation: Goal: Ability to maintain clinical measurements within normal limits will improve Outcome: Progressing Pt complained of diarrhea and "acid on my stomach" today. Reports relief from PRN medications given as ordered.

## 2017-02-17 NOTE — Progress Notes (Signed)
Pt complains of stomach pain today related to acid reflux and diarrhea. PRN medications given as ordered, pt reports relief. Pt is pleasant and cooperative. No sexually inappropriate comments about masturbating have been made since this morning. Pt did not attend group today as he was feeling unwell. Denies SI/HI/AVH. Support and encouragement provided. Medications administered as ordered with education. Safety maintained with every 15 minute checks. Will continue to monitor.

## 2017-02-17 NOTE — Progress Notes (Signed)
Pt smiling and bright, approached this nurse for morning medications. Compliant with medications and is knowledgeable about medications- their doses and uses. Without compliant except reporting being "sexually frustrated." Support and encouragement provided. Medications administered as ordered with education. Safety maintained with every 15 minute checks. Will continue to monitor.

## 2017-02-17 NOTE — Progress Notes (Signed)
Pt to nurse reporting third bowel movement today, diarrhea. This nurse did see that diarrhea is present. Imodium given as ordered PRN. Pt also complaining of "acid on my stomach and I have IBS." Pt took multiple doses of Maalox last shift, see MAR. Informed Dr. Ardyth HarpsHernandez, awaiting new orders. Safety maintained. Pt requested to lie down, approved pt to miss group. Will continue to monitor.

## 2017-02-17 NOTE — Progress Notes (Signed)
CSW made following attempts to locate therapy follow up appt for pt: [facility]: does not take [facility].  CSW called [employer] directly, received list of 8 possibilities.  From that list: [facility]: does not take [employer] [facility]: phone number did not work [facility]: listed twice, phone message states all providers have retired, Financial risk analystpractice closed. [facility] was on the list... [facility]: no appts until late august/early Sept. Jenetta DownerJohn Price, PhD.  Message left.   [facility]: phone number disconnected.  CSW has investigated this previously: [place] location has been closed for several years.  [facility] contacted: unable to give appt at this time but will follow up Monday with providers and will call back with appt then.  Garner NashGregory Oakley Shaffer, MSW, LCSW Clinical Social Worker 02/17/2017 3:55 PM

## 2017-02-17 NOTE — Progress Notes (Signed)
[facility] MD Progress Note  02/17/2017 10:25 AM Henry Shaffer  MRN:  [id number] Subjective:  Patient is a 41 year old Caucasian male with diagnosis of schizoaffective disorder bipolar type. The patient presented to our emergency Department on July 21 on the IVC.  Patient reported that he had lost his insurance several months ago. He has not been able to consistently take his medications. He has been going to see his psychiatrist at [facility] in [place] where he has been getting samples.  The IVC paperwork and the patient was described as delusional and dangerous to self as he was walking in the road trying to get hit by traffic.   7/25 patient reports that he feels much better. He is slept very well last night. He denies problems with energy, appetite or concentration. He says he is not longer having racing thoughts. He feels that his mood is level. He denies side effects from medications or having any physical complaints. He denies having suicidality, homicidality or auditory or visual hallucinations.  7/26 patient reports he is doing "fantastic". He said he slept really well last night. He is tolerating medications very well. He feels that the medications are helping his mood. He described his mood as "great". He denies problems with appetite, energy or concentration. He denies any side effects. He denies any physical complaints. He signed a 72 hour request for discharge today at 12.  7/27 patient says that emotionally he is feeling pretty well. He has been is sleeping well. Denies side effects from medications. Denies suicidality, homicidality or auditory or visual hallucinations.  He complains of having diarrhea since yesterday. Nurse was not aware and says that he has not reported that to her. Last night per nursing staff he had indigestion but no other symptoms.    Per nursing: Pt smiling and bright, approached this nurse for morning medications. Compliant with medications and is knowledgeable  about medications- their doses and uses. Without compliant except reporting being "sexually frustrated." Support and encouragement provided. Medications administered as ordered with education. Safety maintained with every 15 minute checks. Will continue to monitor.   Principal Problem: Schizoaffective disorder, bipolar type (HCC) Diagnosis:   Patient Active Problem List   Diagnosis Date Noted  . Asthma [J45.909] 02/14/2017  . HTN (hypertension) [I10] 02/13/2017  . Dyslipidemia [E78.5] 02/13/2017  . Diabetes (HCC) [E11.9] 02/13/2017  . Tobacco use disorder [F17.200] 02/13/2017  . Schizoaffective disorder, bipolar type (HCC) [F25.0] 06/29/2012  . Decreased vision [H54.7] 05/08/2012  . 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 He has been hospitalized in [facility] and [facility] in [place]. No prior suicidal attempts  Past Medical History:  Past Medical History:  Diagnosis Date  . Hypertension    History reviewed. No pertinent surgical history.  Family History: History reviewed. No pertinent family history.  Family Psychiatric  History: Patient's sister committed suicide in 14. He has an aunt who has been diagnosed with schizophrenia  Tobacco Screening: Have you used any form of tobacco in the last 30 days? (Cigarettes, Smokeless Tobacco, Cigars, and/or Pipes): Yes Tobacco use, Select all that apply: 5 or more cigarettes per day Are you interested in Tobacco Cessation Medications?: Yes, will notify MD for an order Counseled patient on smoking cessation including recognizing danger situations, developing coping skills and basic information about quitting provided: Yes   Social History: [place] mother [place]. He is single, never married, doesn't have any children, he is  disabled due to mental illness. He has a 12th grade education. He has special aid classes.  Denies any legal history     History    History  Alcohol Use  . 0.6 oz/week  . 1 Cans of beer per week    Comment: occassionally     History  Drug Use  . Types: Marijuana    Comment: liquid marijuana 2 weeks ago    Social History   Social History  . Marital status: Single    Spouse name: N/A  . Number of children: N/A  . Years of education: N/A   Social History Main Topics  . Smoking status: Current Every Day Smoker    Packs/day: 2.00  . Smokeless tobacco: Never Used  . Alcohol use 0.6 oz/week    1 Cans of beer per week     Comment: occassionally  . Drug use: Yes    Types: Marijuana     Comment: liquid marijuana 2 weeks ago  . Sexual activity: No   Other Topics Concern  . None   Social History Narrative  . None     Current Medications: Current Facility-Administered Medications  Medication Dose Route Frequency Provider Last Rate Last Dose  . acetaminophen (TYLENOL) tablet 650 mg  650 mg Oral Q6H PRN Pucilowska, Jolanta B, MD   650 mg at 02/15/17 1838  . albuterol (PROVENTIL HFA;VENTOLIN HFA) 108 (90 Base) MCG/ACT inhaler 2 puff  2 puff Inhalation Q4H PRN Pucilowska, Jolanta B, MD      . alum & mag hydroxide-simeth (MAALOX/MYLANTA) 200-200-20 MG/5ML suspension 30 mL  30 mL Oral Q4H PRN Pucilowska, Jolanta B, MD   30 mL at 02/17/17 0626  . amLODipine (NORVASC) tablet 10 mg  10 mg Oral Daily Pucilowska, Jolanta B, MD   10 mg at 02/17/17 0732  . ARIPiprazole (ABILIFY) tablet 10 mg  10 mg Oral Daily Pucilowska, Jolanta B, MD   10 mg at 02/17/17 0733  . atorvastatin (LIPITOR) tablet 80 mg  80 mg Oral Daily Pucilowska, Jolanta B, MD   80 mg at 02/17/17 0733  . cloNIDine (CATAPRES) tablet 0.1 mg  0.1 mg Oral BID Jimmy FootmanHernandez-Gonzalez, See Beharry, MD   0.1 mg at 02/17/17 0733  . divalproex (DEPAKOTE ER) 24 hr tablet 1,500 mg  1,500 mg Oral Daily Jimmy FootmanHernandez-Gonzalez, Janis Cuffe, MD   1,500 mg at  02/17/17 0733  . lisinopril (PRINIVIL,ZESTRIL) tablet 20 mg  20 mg Oral Daily Jimmy FootmanHernandez-Gonzalez, Railey Glad, MD   20 mg at 02/17/17 0733  . magnesium hydroxide (MILK OF MAGNESIA) suspension 30 mL  30 mL Oral Daily PRN Pucilowska, Jolanta B, MD      . metFORMIN (GLUCOPHAGE) tablet 1,000 mg  1,000 mg Oral BID Pucilowska, Jolanta B, MD   1,000 mg at 02/17/17 0733  . nicotine (NICODERM CQ - dosed in mg/24 hours) patch 21 mg  21 mg Transdermal Q0600 Pucilowska, Jolanta B, MD   21 mg at 02/17/17 0732  . nitroGLYCERIN (NITROSTAT) SL tablet 0.4 mg  0.4 mg Sublingual Q5 min PRN Jimmy FootmanHernandez-Gonzalez, Ritesh Opara, MD      . traZODone (DESYREL) tablet 100 mg  100 mg Oral QHS Pucilowska, Jolanta B, MD   100 mg at 02/16/17 2102    Lab Results:  Results for orders placed or performed during the hospital encounter of 02/14/17 (from the past 48 hour(s))  Glucose, capillary     Status: Abnormal  Collection Time: 02/15/17  8:30 PM  Result Value Ref Range   Glucose-Capillary 107 (H) 65 - 99 mg/dL   Comment 1 Notify RN     Blood Alcohol level:  Lab Results  Component Value Date   ETH <5 02/11/2017    Metabolic Disorder Labs: Lab Results  Component Value Date   HGBA1C 8.9 (H) 02/15/2017   MPG 209 02/15/2017   No results found for: PROLACTIN Lab Results  Component Value Date   CHOL 86 02/15/2017   TRIG 184 (H) 02/15/2017   HDL 23 (L) 02/15/2017   CHOLHDL 3.7 02/15/2017   VLDL 37 02/15/2017   LDLCALC 26 02/15/2017    Physical Findings: AIMS: Facial and Oral Movements Muscles of Facial Expression: None, normal Lips and Perioral Area: None, normal Jaw: None, normal Tongue: None, normal,Extremity Movements Upper (arms, wrists, hands, fingers): None, normal Lower (legs, knees, ankles, toes): None, normal, Trunk Movements Neck, shoulders, hips: None, normal, Overall Severity Severity of abnormal movements (highest score from questions above): None, normal Incapacitation due to abnormal movements: None,  normal Patient's awareness of abnormal movements (rate only patient's report): No Awareness, Dental Status Current problems with teeth and/or dentures?: No Does patient usually wear dentures?: No  CIWA:  CIWA-Ar Total: 0 COWS:  COWS Total Score: 0  Musculoskeletal: Strength & Muscle Tone: within normal limits Gait & Station: normal Patient leans: N/A  Psychiatric Specialty Exam: Physical Exam  Constitutional: He is oriented to person, place, and time. He appears well-developed and well-nourished.  HENT:  Head: Normocephalic and atraumatic.  Eyes: Conjunctivae and EOM are normal.  Neck: Normal range of motion.  Respiratory: Effort normal.  Musculoskeletal: Normal range of motion.  Neurological: He is alert and oriented to person, place, and time.    Review of Systems  Constitutional: Negative.   HENT: Negative.   Eyes: Negative.   Respiratory: Negative.   Cardiovascular: Negative.   Gastrointestinal: Negative.   Genitourinary: Negative.   Musculoskeletal: Negative.   Skin: Negative.   Neurological: Negative.   Endo/Heme/Allergies: Negative.   Psychiatric/Behavioral: Negative.     Blood pressure 128/81, pulse 86, temperature 97.9 F (36.6 C), temperature source Oral, resp. rate 18, height 6' (1.829 m), weight 105.2 kg (232 lb), SpO2 97 %.Body mass index is 31.46 kg/m.  General Appearance: Well Groomed  Eye Contact:  Good  Speech:  Clear and Coherent  Volume:  Normal  Mood:  Dysphoric  Affect:  Appropriate and Congruent  Thought Process:  Linear and Descriptions of Associations: Intact  Orientation:  Full (Time, Place, and Person)  Thought Content:  Hallucinations: None  Suicidal Thoughts:  No  Homicidal Thoughts:  No  Memory:  Immediate;   Fair Recent;   Fair Remote;   Fair  Judgement:  Poor  Insight:  Shallow  Psychomotor Activity:  Normal  Concentration:  Concentration: Fair and Attention Span: Fair  Recall:  [employer] of Knowledge:  Fair  Language:  Good   Akathisia:  No  Handed:    AIMS (if indicated):     Assets:  Manufacturing systems engineer Social Support  ADL's:  Intact  Cognition:  Impaired,  Mild  Sleep:  Number of Hours: 6.3     Treatment Plan Summary: 41 year old Caucasian male with schizoaffective disorder bipolar type. He presented to the ER due to bizarre and dangerous behavior in the setting of noncompliance with medications.  Schizoaffective disorder bipolar type the patient will be continued on Abilify 10 mg a day and Depakote ER 1500 mg  daily at bedtime---He will need a Depakote level on Saturday night prior to discharge.  No changes today  For insomnia patient is being prescribed trazodone 100 mg by mouth daily at bedtime--- he is slept 6 hours last night.  No changes  Hypertension: Continue Norvasc 10 mg a day and lisinopril 20 mg po q day.  BP is wnl  Diabetes continue metformin 1000 mg twice a day  Chronic chest pain patient has orders for nitroglycerin when necessary---denies any chest pain  Dyslipidemia continue Lipitor 80 mg a day  Asthma continue albuterol when necessary.  Diarrhea: Patient has been started on Imodium when necessary.    Vital signs will be changed to q shift  Diet low sodium and carb modified  Precautions every 15 minute checks  Hospitalization status voluntary.  Signed 72 h d/c request--will be d/c on Sunday before 12pm  [facility]  Labs lipid panel completed, TSH within the normal limits. Pending hemoglobin A1c. Will need a Depakote level prior to discharge  Likely will be d/c home after depakote level obtained   Jimmy FootmanHernandez-Gonzalez,  Denai Caba, MD 02/17/2017, 10:25 AM

## 2017-02-17 NOTE — Progress Notes (Signed)
   02/17/17 1600  Clinical Encounter Type  Visited With Patient  Visit Type Follow-up  Referral From Patient   [facility] spoke briefly with patient in the hallway. [facility] however he is always busy with staff, eating, or sleeping. Patient stated his stomach was still a bit upset and that [facility] Patient appeared to be in a good mood as he was smiling and joyful; however holding his stomach due to it hurting.

## 2017-02-17 NOTE — Progress Notes (Signed)
   02/17/17 1005  Clinical Encounter Type  Visited With Patient not available  Visit Type Follow-up;Behavioral Health  Referral From Nurse;Chaplain;Patient   CH follow-up on consult for request for support. Patient was sleeping. CH will come back at a later time. [facility].

## 2017-02-17 NOTE — Plan of Care (Signed)
Problem: Coping: Goal: Ability to cope will improve Outcome: Progressing Patient was cooperative with treatment on shift, interacted appropriately with staff and peers. Still preoccupied with religion but was pleasant.

## 2017-02-18 LAB — VALPROIC ACID LEVEL: VALPROIC ACID LVL: 95 ug/mL (ref 50.0–100.0)

## 2017-02-18 NOTE — [facility] Group Notes (Signed)
[facility] Group Notes:  (Nursing/MHT/Case Management/Adjunct)  Date:  02/18/2017  Time:  7:03 AM  Type of Therapy:  Psychoeducational Skills  Participation Level:  Active  Participation Quality:  Appropriate, Attentive and Sharing  Affect:  Appropriate  Cognitive:  Appropriate  Insight:  Appropriate and Good  Engagement in Group:  Engaged  Modes of Intervention:  Discussion, Socialization and Support  Summary of Progress/Problems:  Chancy MilroyLaquanda Y Miller [place] 02/18/2017, 7:03 AM

## 2017-02-18 NOTE — Progress Notes (Signed)
[facility]  02/18/2017 12:50 PM Henry Shaffer  MRN:  [id number] Subjective:  Patient is a 41 year old Caucasian male with diagnosis of schizoaffective disorder bipolar type. The patient presented to our emergency Department on July 21 on the IVC.  Patient reported that he had lost his insurance several months ago. He has not been able to consistently take his medications. He has been going to see his psychiatrist at [facility] in [place] where he has been getting samples.  The IVC paperwork and the patient was described as delusional and dangerous to self as he was walking in the road trying to get hit by traffic.   7/25 patient reports that he feels much better. He is slept very well last night. He denies problems with energy, appetite or concentration. He says he is not longer having racing thoughts. He feels that his mood is level. He denies side effects from medications or having any physical complaints. He denies having suicidality, homicidality or auditory or visual hallucinations.  7/26 patient reports he is doing "fantastic". He said he slept really well last night. He is tolerating medications very well. He feels that the medications are helping his mood. He described his mood as "great". He denies problems with appetite, energy or concentration. He denies any side effects. He denies any physical complaints. He signed a 72 hour request for discharge today at 12.  7/27 patient says that emotionally he is feeling pretty well. He has been is sleeping well. Denies side effects from medications. Denies suicidality, homicidality or auditory or visual hallucinations.  He complains of having diarrhea since yesterday. Nurse was not aware and says that he has not reported that to her. Last night per nursing staff he had indigestion but no other symptoms.   7/28 patient no longer has diarrhea or abdominal pain. He says he is doing well physically and emotionally. Denies SI, HI or auditory  or visual hallucinations. His mood stable. No side effects. No physical complaints   Per nursing: Pt smiling and bright, approached this nurse for morning medications. Compliant with medications and is knowledgeable about medications- their doses and uses. Without compliant except reporting being "sexually frustrated." Support and encouragement provided. Medications administered as ordered with education. Safety maintained with every 15 minute checks. Will continue to monitor.   Principal Problem: Schizoaffective disorder, bipolar type (HCC) Diagnosis:   Patient Active Problem List   Diagnosis Date Noted  . Asthma [J45.909] 02/14/2017  . HTN (hypertension) [I10] 02/13/2017  . Dyslipidemia [E78.5] 02/13/2017  . Diabetes (HCC) [E11.9] 02/13/2017  . Tobacco use disorder [F17.200] 02/13/2017  . Schizoaffective disorder, bipolar type (HCC) [F25.0] 06/29/2012  . Decreased vision [H54.7] 05/08/2012  . 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 [facility] in [place]. No prior suicidal attempts  Past Medical History:  Past Medical History:  Diagnosis Date  . Hypertension    History reviewed. No pertinent surgical history.  Family History: History reviewed. No pertinent family history.  Family Psychiatric  History: Patient's sister committed suicide in [id number]. He has an aunt who has been diagnosed with schizophrenia  Tobacco Screening: Have you used any form of tobacco in the last 30 days? (Cigarettes, Smokeless Tobacco, Cigars, and/or Pipes): Yes Tobacco use, Select all that apply: 5 or more cigarettes per day Are you interested  in Tobacco Cessation Medications?: Yes, will notify MD for an order Counseled patient on smoking cessation including recognizing danger situations,  developing coping skills and basic information about quitting provided: Yes   Social History: Patient lives with his mother in [place]. He is single, never married, doesn't have any children, he is disabled due to mental illness. He has a 12th grade education. He has special aid classes.  Denies any legal history     History    History  Alcohol Use  . 0.6 oz/week  . 1 Cans of beer per week    Comment: occassionally     History  Drug Use  . Types: Marijuana    Comment: liquid marijuana 2 weeks ago    Social History   Social History  . Marital status: Single    Spouse name: N/A  . Number of children: N/A  . Years of education: N/A   Social History Main Topics  . Smoking status: Current Every Day Smoker    Packs/day: 2.00  . Smokeless tobacco: Never Used  . Alcohol use 0.6 oz/week    1 Cans of beer per week     Comment: occassionally  . Drug use: Yes    Types: Marijuana     Comment: liquid marijuana 2 weeks ago  . Sexual activity: No   Other Topics Concern  . None   Social History Narrative  . None     Current Medications: Current Facility-Administered Medications  Medication Dose Route Frequency Provider Last Rate Last Dose  . acetaminophen (TYLENOL) tablet 650 mg  650 mg Oral Q6H PRN Pucilowska, Jolanta B, MD   650 mg at 02/15/17 1838  . albuterol (PROVENTIL HFA;VENTOLIN HFA) 108 (90 Base) MCG/ACT inhaler 2 puff  2 puff Inhalation Q4H PRN Pucilowska, Jolanta B, MD      . alum & mag hydroxide-simeth (MAALOX/MYLANTA) 200-200-20 MG/5ML suspension 30 mL  30 mL Oral Q4H PRN Pucilowska, Jolanta B, MD   30 mL at 02/17/17 0626  . amLODipine (NORVASC) tablet 10 mg  10 mg Oral Daily Pucilowska, Jolanta B, MD   10 mg at 02/18/17 0910  . ARIPiprazole (ABILIFY) tablet 10 mg  10 mg Oral Daily Pucilowska, Jolanta B, MD   10 mg at 02/18/17 0910  . atorvastatin (LIPITOR) tablet 80 mg  80 mg Oral Daily Pucilowska, Jolanta B, MD   80 mg at 02/18/17 0910  . bismuth  subsalicylate (PEPTO BISMOL) 262 MG/15ML suspension 30 mL  30 mL Oral TID AC & HS Jimmy FootmanHernandez-Gonzalez, Cherissa Hook, MD   30 mL at 02/18/17 1215  . divalproex (DEPAKOTE ER) 24 hr tablet 1,500 mg  1,500 mg Oral Daily Jimmy FootmanHernandez-Gonzalez, Wyett Narine, MD   1,500 mg at 02/18/17 0910  . lisinopril (PRINIVIL,ZESTRIL) tablet 20 mg  20 mg Oral Daily Jimmy FootmanHernandez-Gonzalez, Charizma Gardiner, MD   20 mg at 02/18/17 0910  . loperamide (IMODIUM) capsule 4 mg  4 mg Oral PRN Jimmy FootmanHernandez-Gonzalez, Bergen Magner, MD   4 mg at 02/17/17 1413  . magnesium hydroxide (MILK OF MAGNESIA) suspension 30 mL  30 mL Oral Daily PRN Pucilowska, Jolanta B, MD      . nicotine (NICODERM CQ - dosed in mg/24 hours) patch 21 mg  21 mg Transdermal Q0600 Pucilowska, Jolanta B, MD   21 mg at 02/18/17 0913  . nitroGLYCERIN (NITROSTAT) SL tablet 0.4 mg  0.4 mg Sublingual Q5 min PRN Jimmy FootmanHernandez-Gonzalez, Sammuel Blick, MD      . traZODone (DESYREL) tablet 100 mg  100 mg Oral QHS Pucilowska,  Jolanta B, MD   100 mg at 02/17/17 2144    Lab Results:  No results found for this or any previous visit (from the past 48 hour(s)).  Blood Alcohol level:  Lab Results  Component Value Date   ETH <5 02/11/2017    Metabolic Disorder Labs: Lab Results  Component Value Date   HGBA1C 8.9 (H) 02/15/2017   MPG 209 02/15/2017   No results found for: PROLACTIN Lab Results  Component Value Date   CHOL 86 02/15/2017   TRIG 184 (H) 02/15/2017   HDL 23 (L) 02/15/2017   CHOLHDL 3.7 02/15/2017   VLDL 37 02/15/2017   LDLCALC 26 02/15/2017    Physical Findings: AIMS: Facial and Oral Movements Muscles of Facial Expression: None, normal Lips and Perioral Area: None, normal Jaw: None, normal Tongue: None, normal,Extremity Movements Upper (arms, wrists, hands, fingers): None, normal Lower (legs, knees, ankles, toes): None, normal, Trunk Movements Neck, shoulders, hips: None, normal, Overall Severity Severity of abnormal movements (highest score from questions above): None,  normal Incapacitation due to abnormal movements: None, normal Patient's awareness of abnormal movements (rate only patient's report): No Awareness, Dental Status Current problems with teeth and/or dentures?: No Does patient usually wear dentures?: No  CIWA:  CIWA-Ar Total: 0 COWS:  COWS Total Score: 0  Musculoskeletal: Strength & Muscle Tone: within normal limits Gait & Station: normal Patient leans: N/A  Psychiatric Specialty Exam: Physical Exam  Constitutional: He is oriented to person, place, and time. He appears well-developed and well-nourished.  HENT:  Head: Normocephalic and atraumatic.  Eyes: Conjunctivae and EOM are normal.  Neck: Normal range of motion.  Respiratory: Effort normal.  Musculoskeletal: Normal range of motion.  Neurological: He is alert and oriented to person, place, and time.    Review of Systems  Constitutional: Negative.   HENT: Negative.   Eyes: Negative.   Respiratory: Negative.   Cardiovascular: Negative.   Gastrointestinal: Negative.   Genitourinary: Negative.   Musculoskeletal: Negative.   Skin: Negative.   Neurological: Negative.   Endo/Heme/Allergies: Negative.   Psychiatric/Behavioral: Negative.     Blood pressure 125/90, pulse 78, temperature 98.3 F (36.8 C), temperature source Oral, resp. rate 18, height 6' (1.829 m), weight 105.2 kg (232 lb), SpO2 97 %.Body mass index is 31.46 kg/m.  General Appearance: Well Groomed  Eye Contact:  Good  Speech:  Clear and Coherent  Volume:  Normal  Mood:  Dysphoric  Affect:  Appropriate and Congruent  Thought Process:  Linear and Descriptions of Associations: Intact  Orientation:  Full (Time, Place, and Person)  Thought Content:  Hallucinations: None  Suicidal Thoughts:  No  Homicidal Thoughts:  No  Memory:  Immediate;   Fair Recent;   Fair Remote;   Fair  Judgement:  Poor  Insight:  Shallow  Psychomotor Activity:  Normal  Concentration:  Concentration: Fair and Attention Span: Fair   Recall:  [employer] of Knowledge:  Fair  Language:  Good  Akathisia:  No  Handed:    AIMS (if indicated):     Assets:  Manufacturing systems engineer Social Support  ADL's:  Intact  Cognition:  Impaired,  Mild  Sleep:  Number of Hours: 3.25 (3 hrs 15 min)     Treatment Plan Summary: 41 year old Caucasian male with schizoaffective disorder bipolar type. He presented to the ER due to bizarre and dangerous behavior in the setting of noncompliance with medications.  Schizoaffective disorder bipolar type the patient will be continued on Abilify 10 mg a day  and Depakote ER 1500 mg daily at bedtime--- I will place orders to check Depakote level tonight. Plan to discharge in the morning  For insomnia patient is being prescribed trazodone 100 mg by mouth daily at bedtime--- he is slept 6 hours last night.  No changes  Hypertension: Continue Norvasc 10 mg a day and lisinopril 20 mg po q day.  BP is wnl  Diabetes continue metformin 1000 mg twice a day  Chronic chest pain patient has orders for nitroglycerin when necessary---denies any chest pain  Dyslipidemia continue Lipitor 80 mg a day  Asthma continue albuterol when necessary.  Diarrhea: Patient has been started on Imodium when necessary--- resolved  Vital signs will be changed to q shift  Diet low sodium and carb modified  Precautions every 15 minute checks  Hospitalization status voluntary.  Signed 72 h d/c request--will be d/c on Sunday before 12pm  Records from prior admission to [facility] in 2016 have been reviewed  Labs lipid panel completed, TSH within the normal limits. Pending hemoglobin A1c. Will need a Depakote level prior to discharge  Likely will be d/c home after depakote level obtained--- will be discharged tomorrow morning   Jimmy Footman, MD 02/18/2017, 12:50 PM

## 2017-02-18 NOTE — Plan of Care (Signed)
Problem: Activity: Goal: Sleeping patterns will improve Outcome: Progressing PT reported poor sleep due to nightmare last night. Of note, pt was wearing nicotine patch. Informed pt to remove nicotine patch tonight.

## 2017-02-18 NOTE — Plan of Care (Signed)
Problem: Medication: Goal: Compliance with prescribed medication regimen will improve Outcome: Progressing Pt is compliant with medications prescribed. Medications administered as ordered with education.

## 2017-02-18 NOTE — [facility] Group Notes (Signed)
[facility] LCSW Group Therapy  02/18/2017  1:00pm Mood: Good  Group Topic: Cognitive Distortions Pt response: Pt participated in group appropriately, shares his challenges with his family relationships.  Able to identify some distortions in his patterns of thinking. Able to identify how these patterns of thinking could be unhelpful.  Cleda DaubSara P Grant Swager, LCSW 02/18/2017, 3:44 PM

## 2017-02-18 NOTE — Progress Notes (Signed)
Pt observed sleeping in late this morning, reports poor sleep due to nightmares during the night. Refused breakfast. Requested to take medications later. Reports no diarrhea since yesterday afternoon. Safety maintained. Will continue to monitor.

## 2017-02-18 NOTE — Progress Notes (Signed)
Pt reports no episodes of diarrhea today. Refused 1700 dose of Pepto as ordered, see MAR. Bright, pleasant, cooperative. Denies SI/HI/AVH, pain. Reports feeling better and expects to be discharged tomorrow. Medication compliant. Safety maintained. Will continue to monitor.

## 2017-02-18 NOTE — Progress Notes (Signed)
D: Pt denies SI/HI/AVH . Pt is pleasant and cooperative. Pt stated he was doing great, he was ready to leave. Pt stated he plans to get a "Big Mac" when he gets out. Pt stated he felt much better this evening.  A: Pt was offered support and encouragement. Pt was given scheduled medications. Pt was encourage to attend groups. Q 15 minute checks were done for safety.   R:Pt attends groups and interacts well with peers and staff. Pt is taking medication. Pt has no complaints at this time .Pt receptive to treatment and safety maintained on unit.

## 2017-02-18 NOTE — Progress Notes (Signed)
Patient is pleasant. Pt interacts with peers and staff appropriately. No aggressive or inappropriate behaviors noted. Pt was compliant with medications. Will continue to monitor.

## 2017-02-18 NOTE — Plan of Care (Signed)
Problem: Coping: Goal: Ability to cope will improve Outcome: Progressing Pt seen on the unit interacting with peers, pt not inappropriate this evening  Problem: Activity: Goal: Sleeping patterns will improve Outcome: Not Progressing Pt slept 3.25 last night

## 2017-02-19 MED ORDER — TRAZODONE HCL 100 MG PO TABS: 100.0000 mg | ORAL_TABLET | Freq: Every day | ORAL | 0 refills | Status: DC

## 2017-02-19 MED ORDER — METFORMIN HCL 1000 MG PO TABS: 1000.0000 mg | ORAL_TABLET | Freq: Two times a day (BID) | ORAL | 0 refills | Status: DC

## 2017-02-19 MED ORDER — DIVALPROEX SODIUM ER 500 MG PO TB24: 1500.0000 mg | ORAL_TABLET | Freq: Every day | ORAL | 0 refills | Status: DC

## 2017-02-19 MED ORDER — LISINOPRIL 20 MG PO TABS: 20.0000 mg | ORAL_TABLET | Freq: Every day | ORAL | 0 refills | Status: DC

## 2017-02-19 MED ORDER — ATORVASTATIN CALCIUM 80 MG PO TABS: 80.0000 mg | ORAL_TABLET | Freq: Every day | ORAL | 0 refills | Status: DC

## 2017-02-19 MED ORDER — AMLODIPINE BESYLATE 10 MG PO TABS: 10.0000 mg | ORAL_TABLET | Freq: Every day | ORAL | 0 refills | Status: DC

## 2017-02-19 MED ORDER — ARIPIPRAZOLE 10 MG PO TABS: 10.0000 mg | ORAL_TABLET | Freq: Every day | ORAL | 0 refills | Status: DC

## 2017-02-19 NOTE — Progress Notes (Addendum)
  [facility] Discharge Plan :  Will you be returning to the same living situation after discharge:  Yes,    At discharge, do you have transportation home?: Yes,    Do you have the ability to pay for your medications: Yes,     Release of information consent forms completed and in the chart;  Patient's signature needed at discharge.  Patient to Follow up at: Follow-up Information    [facility] Go on 03/20/2017.   Why:  Please attend your medication appointment with Dr. Percell BeltMillet on Monday, 03/20/17, at 9:00am.  Please bring a copy of your hospital discharge paperwork. Contact information: [address] [place] [address] [phone]        [facility] Follow up.   Why:  We will call you with an appointment for therapy on Monday, 02/20/17. Contact information: P: [phone]       Bertram SavinFeltner, Cynthia K, MD. Schedule an appointment as soon as possible for a visit in 1 week(s).   Specialty:  Internal Medicine Why:  Please call you family doctor and schedule a follow up Contact information: [address] Medicine [id number] [facility] [address] [place] [address] [phone]           Next level of care provider has access to [facility] Link:no  Safety Planning and Suicide Prevention discussed: Yes,     Have you used any form of tobacco in the last 30 days? (Cigarettes, Smokeless Tobacco, Cigars, and/or Pipes): Yes  Has patient been referred to the Quitline?: Patient refused referral  Patient has been referred for addiction treatment: Yes  Glennon MacSara P Laws, LCSW 02/19/2017, 11:24 AM   02/20/17.  Follow up appointment for therapy obtained from [facility], [facility].  [address], [place], [place] [address].  P: [phone]: [phone], F: [phone]: [phone].  Wed, 02/22/17 9:30am. With Anthonette LegatoNisi Gupta. CSW made following attempts to contact pt: Phone calls to [phone]: number not working,  [phone]: no voicemail, CSW sent text asking for return call, [phone]: LM.  Phone contact had been very difficult with this pt throughout stay--multiple numbers in use by the family. Garner NashGregory Lameshia Hypolite, MSW, LCSW Clinical Social Worker 02/20/2017 10:12 AM   02/21/17.  CSW made multiple attempts to multiple phone numbers to inform pt of appt.  Pt finally reached family member [name] at [phone].  He asked CSW to call another number but CSW could not reach pt.  [name] agreed to take down the information for appt and said he will see pt tonight and give him the details. Garner NashGregory Kenika Sahm, MSW, LCSW Clinical Social Worker 02/21/2017 3:03 PM

## 2017-02-19 NOTE — Progress Notes (Signed)
Patient was visible on the unit interacting with peers and staff and watching TV in the dayroom. Patient was ready and excited about discharge and going home. Patient denies SI/HI/AH/VH. Patient is alert and oriented x 4, breathing unlabored, and extremities x 4 within normal limits. Patient is calm and cooperative. Patient did not display any disruptive behavior. Patient was provided all belongings. Discharge instructions and medication prescriptions was provided to caregiver and verbalized understanding.

## 2017-02-19 NOTE — [facility] Group Notes (Signed)
[facility] Group Notes:  (Nursing/MHT/Case Management/Adjunct)  Date:  02/19/2017  Time:  6:55 AM  Type of Therapy:  Psychoeducational Skills  Participation Level:  Active  Participation Quality:  Appropriate and Attentive  Affect:  Appropriate  Cognitive:  Appropriate  Insight:  Appropriate and Good  Engagement in Group:  Engaged  Modes of Intervention:  Discussion, Socialization and Support  Summary of Progress/Problems:  Chancy MilroyLaquanda Y Jyaire Koudelka 02/19/2017, 6:55 AM

## 2017-02-19 NOTE — [facility] Suicide Risk Assessment (Signed)
[facility]   Principal Problem: Schizoaffective disorder, bipolar type [facility](HCC) Discharge Diagnoses:  Patient Active Problem List   Diagnosis Date Noted  . Asthma [J45.909] 02/14/2017  . HTN (hypertension) [I10] 02/13/2017  . Dyslipidemia [E78.5] 02/13/2017  . Diabetes (HCC) [E11.9] 02/13/2017  . Tobacco use disorder [F17.200] 02/13/2017  . Schizoaffective disorder, bipolar type (HCC) [F25.0] 06/29/2012  . Decreased vision [H54.7] 05/08/2012  . 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 rate 18, height 6' (1.829 m), weight 105.2 kg (232 lb), SpO2 97 %.Body mass index is 31.46 kg/m.                                                       Mental Status Per Nursing Assessment::   On Admission:  NA  Demographic Factors:  Male and Caucasian  Loss Factors: NA  Historical Factors: Family history of suicide, Family history of mental illness or substance abuse and Impulsivity  Risk Reduction Factors:   Living with another person, especially a relative and Positive social support  Denies access to guns  Continued Clinical Symptoms:  Previous Psychiatric Diagnoses and Treatments  Cognitive Features That Contribute To Risk:  Closed-mindedness    Suicide Risk:  Minimal: No identifiable suicidal ideation.  Patients presenting with no risk factors but with morbid ruminations; may be classified as minimal risk based on the severity of the depressive symptoms  Follow-up Information    [facility] Go on 03/20/2017.   Why:  Please attend your medication appointment with Dr. Percell BeltMillet on Monday, 03/20/17, at 9:00am.  Please bring a copy of your hospital discharge paperwork. Contact information: [address] [place] [address] [phone]        [facility] Follow up.   Why:  We will call you with an appointment for therapy on Monday, 02/20/17. Contact information: P: [phone]           Jimmy FootmanHernandez-Gonzalez,  Latrina Guttman, MD 02/19/2017, 10:24 AM

## 2017-02-19 NOTE — Plan of Care (Signed)
Problem: Safety: Goal: Periods of time without injury will increase Outcome: Progressing Patient remains safe and without injury during hospitalization and on Q 15 minute observations. Will continue to monitor patient.   

## 2017-02-19 NOTE — Discharge Instructions (Signed)
Please call your family doctor and schedule an appointment within the next 10 days.  Your blood pressure medications have changed and your diabetes is not well controlled.

## 2017-02-19 NOTE — Discharge Summary (Signed)
Physician Discharge Summary Note  Patient:  Henry Shaffer is an 41 y.o., male MRN:  [id number] DOB:  19-Mar-1976 Patient phone:  [phone] (home)  Patient address:   [address] [place] [address],  Total Time spent with patient: 30 minutes  Date of Admission:  02/14/2017 Date of Discharge: 02/19/17  Reason for Admission:  psychosis  Principal Problem: Schizoaffective disorder, bipolar type [facility]) Discharge Diagnoses: Patient Active Problem List   Diagnosis Date Noted  . Asthma [J45.909] 02/14/2017  . HTN (hypertension) [I10] 02/13/2017  . Dyslipidemia [E78.5] 02/13/2017  . Diabetes (HCC) [E11.9] 02/13/2017  . Tobacco use disorder [F17.200] 02/13/2017  . Schizoaffective disorder, bipolar type (HCC) [F25.0] 06/29/2012  . Decreased vision [H54.7] 05/08/2012  . 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 The patient presented to our emergency Department on July 21 on the IVC.  Patient reported that he had lost his insurance several months ago. He has not been able to consistently take his medications. He has been going to see his psychiatrist at [facility] in [place] where he has been getting samples.  The IVC paperwork and the patient was described as delusional and dangerous to self as he was walking in the road trying to get hit by traffic.  Patient explains that he lives with his mother in [place]. His brother comes to visit sometimes. On this particular day the patient became upset with his brother because he was threatening the patient's dogs and was mistreated and the patient's nephew. He is to him off the house and was walking on the road but denies that he was attempting to harm himself.  He also denies problems with his mood appetite, energy, sleep or concentration, denies suicidality, homicidality or hallucinations.  As far  as substance abuse the patient tells me he drinks about twice per week. He says most of the time he only drinks 1 or 2 beers at the time but is other locations he is able to drink a whole case. His urine toxicology was negative upon admission. His alcohol level was below detection limit. The patient smokes about 4 packs of cigarettes per day. He also says that he smokes marijuana sometimes. He said that about 2 weeks ago he tries synthetic marijuana.  Associated Signs/Symptoms: Depression Symptoms:  denies (Hypo) Manic Symptoms:  Impulsivity, Anxiety Symptoms:  Excessive Worry, Psychotic Symptoms:  denies PTSD Symptoms: Had a traumatic exposure:  n/a Total Time spent with patient: 1 hour  Past Psychiatric History: Patient is disabled has been diagnosed with schizoaffective disorder bipolar type. He has been hospitalized in [facility] and [facility] in [place]. No prior suicidal attempts   Past Medical History:  Past Medical History:  Diagnosis Date  . Hypertension    History reviewed. No pertinent surgical history.  Family History: History reviewed. No pertinent family history.  Family Psychiatric  History: Patient's sister committed suicide in 44. He has an aunt who has been diagnosed with schizophrenia  Social History: Patient lives with his mother in [place]. He is single, never married, doesn't have any children, he is disabled due to mental illness. He has a 12th grade education. He has special aid classes.  Denies any legal history History  Alcohol Use  . 0.6 oz/week  . 1 Cans of beer per week    Comment: occassionally     History  Drug Use  .  Types: Marijuana    Comment: liquid marijuana 2 weeks ago    Social History   Social History  . Marital status: Single    Spouse name: N/A  . Number of children: N/A  . Years of education: N/A   Social History Main Topics  . Smoking status: Current Every Day Smoker    Packs/day: 2.00  . Smokeless  tobacco: Never Used  . Alcohol use 0.6 oz/week    1 Cans of beer per week     Comment: occassionally  . Drug use: Yes    Types: Marijuana     Comment: liquid marijuana 2 weeks ago  . Sexual activity: No   Other Topics Concern  . None   Social History Narrative  . None    Hospital Course:    41 year old Caucasian male with schizoaffective disorder bipolar type. He presented to the ER due to bizarre and dangerous behavior in the setting of noncompliance with medications.  Schizoaffective disorder bipolar type the patient will be continued on Abilify 10 mg a day and Depakote ER 1500 mg daily at bedtime  For insomnia patient is being prescribed trazodone 100 mg by mouth daily at bedtime--- he is slept 6 hours last night.  No changes  Hypertension: Continue Norvasc 10 mg a day and lisinopril 20 mg po q day.  BP is wnl  Diabetes continue metformin 1000 mg twice a day  Chronic chest pain patient has orders for nitroglycerin when necessary---denies any chest pain  Dyslipidemia continue Lipitor 80 mg a day  Asthma continue albuterol when necessary.  Diarrhea: Patient has been started on Imodium when necessary--- resolved   Records from prior admission to [facility] in 2016 have been reviewed  Labs lipid panel completed, TSH within the normal limits. Pending hemoglobin A1c.   Reassessed started on his home medications. Responded very well to the medications without any adverse reactions. Mood is now euthymic. He denies suicidality or homicidality. There is no evidence of psychosis or delusions.  This hospitalization was uneventful. He did not require seclusion, restraints or forced medications. He attended groups. His interactions with peers and the staff was appropriate. He was compliant with all of his medications  Depakote level was 95 last night  Family has been contacted and they do not have concerns about discharge, staff does not have concerns about his safety upon  discharge as they feel he is much improved. Patient has denied having any access to guns.   Physical Findings: AIMS: Facial and Oral Movements Muscles of Facial Expression: None, normal Lips and Perioral Area: None, normal Jaw: None, normal Tongue: None, normal,Extremity Movements Upper (arms, wrists, hands, fingers): None, normal Lower (legs, knees, ankles, toes): None, normal, Trunk Movements Neck, shoulders, hips: None, normal, Overall Severity Severity of abnormal movements (highest score from questions above): None, normal Incapacitation due to abnormal movements: None, normal Patient's awareness of abnormal movements (rate only patient's report): No Awareness, Dental Status Current problems with teeth and/or dentures?: No Does patient usually wear dentures?: No  CIWA:  CIWA-Ar Total: 0 COWS:  COWS Total Score: 0  Musculoskeletal: Strength & Muscle Tone: within normal limits Gait & Station: normal Patient leans: N/A  Psychiatric Specialty Exam: Physical Exam  Constitutional: He is oriented to person, place, and time. He appears well-developed and well-nourished.  HENT:  Head: Normocephalic and atraumatic.  Eyes: Conjunctivae and EOM are normal.  Neck: Normal range of motion.  Respiratory: Effort normal.  Musculoskeletal: Normal range of motion.  Neurological: He is  alert and oriented to person, place, and time.    Review of Systems  Constitutional: Negative.   HENT: Negative.   Eyes: Negative.   Respiratory: Negative.   Cardiovascular: Negative.   Gastrointestinal: Negative.   Genitourinary: Negative.   Musculoskeletal: Negative.   Skin: Negative.   Neurological: Negative.   Endo/Heme/Allergies: Negative.   Psychiatric/Behavioral: Negative.     Blood pressure 137/86, pulse 74, temperature 98.1 F (36.7 C), temperature source Oral, resp. rate 18, height 6' (1.829 m), weight 105.2 kg (232 lb), SpO2 97 %.Body mass index is 31.46 kg/m.  General Appearance: Well  Groomed  Eye Contact:  Good  Speech:  Clear and Coherent  Volume:  Normal  Mood:  Euthymic  Affect:  Appropriate and Congruent  Thought Process:  Linear and Descriptions of Associations: Intact  Orientation:  Full (Time, Place, and Person)  Thought Content:  Hallucinations: None  Suicidal Thoughts:  No  Homicidal Thoughts:  No  Memory:  Immediate;   Good Recent;   Good Remote;   Good  Judgement:  Fair  Insight:  Fair  Psychomotor Activity:  Normal  Concentration:  Concentration: Fair and Attention Span: Fair  Recall:  Good  Fund of Knowledge:  Good  Language:  Good  Akathisia:  No  Handed:    AIMS (if indicated):     Assets:  Communication Skills Social Support  ADL's:  Intact  Cognition:  WNL  Sleep:  Number of Hours: 6     Have you used any form of tobacco in the last 30 days? (Cigarettes, Smokeless Tobacco, Cigars, and/or Pipes): Yes  Has this patient used any form of tobacco in the last 30 days? (Cigarettes, Smokeless Tobacco, Cigars, and/or Pipes) Yes, Yes, A prescription for an FDA-approved tobacco cessation medication was offered at discharge and the patient refused  Blood Alcohol level:  Lab Results  Component Value Date   ETH <5 02/11/2017    Metabolic Disorder Labs:  Lab Results  Component Value Date   HGBA1C 8.9 (H) 02/15/2017   MPG 209 02/15/2017   No results found for: PROLACTIN Lab Results  Component Value Date   CHOL 86 02/15/2017   TRIG 184 (H) 02/15/2017   HDL 23 (L) 02/15/2017   CHOLHDL 3.7 02/15/2017   VLDL 37 02/15/2017   LDLCALC 26 02/15/2017     Ref. Range 02/15/2017 06:48 02/18/2017 20:21  Valproic Acid,S Latest Ref Range: 50.0 - 100.0 ug/mL  95  Hemoglobin A1C Latest Ref Range: 4.8 - 5.6 % 8.9 (H)   TSH Latest Ref Range: 0.350 - 4.500 uIU/mL 3.127      Ref. Range 02/11/2017 20:20  COMPREHENSIVE METABOLIC PANEL Unknown Rpt (A)  Sodium Latest Ref Range: 135 - 145 mmol/L 140  Potassium Latest Ref Range: 3.5 - 5.1 mmol/L 3.7   Chloride Latest Ref Range: 101 - 111 mmol/L 103  CO2 Latest Ref Range: 22 - 32 mmol/L 26  Glucose Latest Ref Range: 65 - 99 mg/dL 295274 (H)  BUN Latest Ref Range: 6 - 20 mg/dL 14  Creatinine Latest Ref Range: 0.61 - 1.24 mg/dL 6.211.49 (H)  Calcium Latest Ref Range: 8.9 - 10.3 mg/dL 9.4  Anion gap Latest Ref Range: 5 - 15  11  Alkaline Phosphatase Latest Ref Range: 38 - 126 U/L 77  Albumin Latest Ref Range: 3.5 - 5.0 g/dL 4.1  AST Latest Ref Range: 15 - 41 U/L 19  ALT Latest Ref Range: 17 - 63 U/L 19  Total Protein Latest Ref Range: 6.5 -  8.1 g/dL 7.3  Total Bilirubin Latest Ref Range: 0.3 - 1.2 mg/dL 0.5  GFR, Est African American Latest Ref Range: >60 mL/min >60  GFR, Est Non African American Latest Ref Range: >60 mL/min 56 (L)  WBC Latest Ref Range: 3.8 - 10.6 K/uL 14.2 (H)  RBC Latest Ref Range: 4.40 - 5.90 MIL/uL 5.10  Hemoglobin Latest Ref Range: 13.0 - 18.0 g/dL 16.114.7  HCT Latest Ref Range: 40.0 - 52.0 % 43.2  MCV Latest Ref Range: 80.0 - 100.0 fL 84.7  MCH Latest Ref Range: 26.0 - 34.0 pg 28.9  MCHC Latest Ref Range: 32.0 - 36.0 g/dL 09.634.1  RDW Latest Ref Range: 11.5 - 14.5 % 14.3  Platelets Latest Ref Range: 150 - 440 K/uL 225     Ref. Range 02/11/2017 20:20 02/11/2017 20:22  Alcohol, Ethyl (B) Latest Ref Range: <5 mg/dL <5   Salicylate Lvl Latest Ref Range: 2.8 - 30.0 mg/dL <0.4<7.0   Amphetamines, Ur Screen Latest Ref Range: NONE DETECTED   NONE DETECTED  Barbiturates, Ur Screen Latest Ref Range: NONE DETECTED   NONE DETECTED  Benzodiazepine, Ur Scrn Latest Ref Range: NONE DETECTED   NONE DETECTED  Cocaine Metabolite,Ur [place] Latest Ref Range: NONE DETECTED   NONE DETECTED  Methadone Scn, Ur Latest Ref Range: NONE DETECTED   NONE DETECTED  MDMA (Ecstasy)Ur Screen Latest Ref Range: NONE DETECTED   NONE DETECTED  Cannabinoid 50 Ng, Ur [place] Latest Ref Range: NONE DETECTED   NONE DETECTED  Opiate, Ur Screen Latest Ref Range: NONE DETECTED   NONE DETECTED  Phencyclidine (PCP) Ur S Latest  Ref Range: NONE DETECTED   NONE DETECTED  Tricyclic, Ur Screen Latest Ref Range: NONE DETECTED   NONE DETECTED    See Psychiatric Specialty Exam and Suicide Risk Assessment completed by Attending Physician prior to discharge.  Discharge destination:  Home  Is patient on multiple antipsychotic therapies at discharge:  No   Has Patient had three or more failed trials of antipsychotic monotherapy by history:  No  Recommended Plan for Multiple Antipsychotic Therapies: NA   Allergies as of 02/19/2017      Reactions   Codeine    Unknown. Pt's father reported this in the past, but pt doesn't know his reaction.   Erythromycin Other (See Comments)   Unknown. Pt's father reported this in the past, but pt doesn't know his reaction.   Sulfa Antibiotics    Unknown. Pt's father reported this in the past, but pt doesn't know his reaction.      Medication List    TAKE these medications     Indication  albuterol 108 (90 Base) MCG/ACT inhaler Commonly known as:  PROVENTIL HFA;VENTOLIN HFA Inhale 2 puffs into the lungs every 6 (six) hours as needed for wheezing or shortness of breath.  Indication:  Disease Involving Spasms of the Bronchus   amLODipine 10 MG tablet Commonly known as:  NORVASC Take 1 tablet (10 mg total) by mouth daily.  Indication:  High Blood Pressure Disorder   ARIPiprazole 10 MG tablet Commonly known as:  ABILIFY Take 1 tablet (10 mg total) by mouth daily.  Indication:  schizoaffective   aspirin 81 MG chewable tablet Chew 81 mg by mouth daily.  Indication:  hart disease   atorvastatin 80 MG tablet Commonly known as:  LIPITOR Take 1 tablet (80 mg total) by mouth daily.  Indication:  High Amount of Fats in the Blood   divalproex 500 MG 24 hr tablet Commonly known as:  DEPAKOTE ER Take 3 tablets (1,500 mg total) by mouth daily. What changed:  how much to take  Indication:  schizoaffective   lisinopril 20 MG tablet Commonly known as:  PRINIVIL,ZESTRIL Take 1  tablet (20 mg total) by mouth daily.  Indication:  High Blood Pressure Disorder   metFORMIN 1000 MG tablet Commonly known as:  GLUCOPHAGE Take 1 tablet (1,000 mg total) by mouth 2 (two) times daily.  Indication:  Type 2 Diabetes   traZODone 100 MG tablet Commonly known as:  DESYREL Take 1 tablet (100 mg total) by mouth at bedtime.  Indication:  Trouble Sleeping      Follow-up Information    [facility] [place]. Go on 03/20/2017.   Why:  Please attend your medication appointment with Dr. Percell Belt on Monday, 03/20/17, at 9:00am.  Please bring a copy of your hospital discharge paperwork. Contact information: [address] [place] [address] [phone]        [facility] Follow up.   Why:  We will call you with an appointment for therapy on Monday, 02/20/17. Contact information: P: [phone]          >30 minutes. >50 % of the time was spent in coordination of care  Signed: Jimmy Footman, MD 02/19/2017, 10:30 AM

## 2017-04-11 ENCOUNTER — Emergency Department
Admission: EM | Admit: 2017-04-11 | Discharge: 2017-04-11 | Disposition: A | Discharge status: Home / Self Care | Payer: MEDICARE | Source: Intra-hospital | Attending: Family | Admitting: Family

## 2017-04-11 MED ORDER — OFLOXACIN 0.3 % EAR DROPS
Freq: Every day | OTIC | 0 refills | 0.00000 days | Status: CP
Start: 2017-04-11 — End: 2017-04-18

## 2017-04-20 ENCOUNTER — Ambulatory Visit: Admission: RE | Admit: 2017-04-20 | Discharge: 2017-04-20 | Payer: MEDICARE

## 2017-04-20 DIAGNOSIS — H6092 Unspecified otitis externa, left ear: Principal | ICD-10-CM

## 2017-06-21 ENCOUNTER — Inpatient Hospital Stay
Admission: EM | Admit: 2017-06-21 | Discharge: 2017-06-24 | Disposition: A | Discharge status: Home / Self Care | Payer: MEDICARE | Source: Intra-hospital

## 2017-06-21 ENCOUNTER — Inpatient Hospital Stay
Admission: EM | Admit: 2017-06-21 | Discharge: 2017-06-24 | Disposition: A | Discharge status: Home / Self Care | Payer: MEDICARE | Source: Intra-hospital | Attending: Internal Medicine

## 2017-06-21 DIAGNOSIS — R079 Chest pain, unspecified: Principal | ICD-10-CM

## 2017-06-23 DIAGNOSIS — R079 Chest pain, unspecified: Principal | ICD-10-CM

## 2017-06-24 MED ORDER — NICOTINE (POLACRILEX) 4 MG BUCCAL LOZENGE
BUCCAL | 0 refills | 0 days | Status: CP | PRN
Start: 2017-06-24 — End: 2017-07-14

## 2017-06-25 MED ORDER — LISINOPRIL 10 MG TABLET
ORAL_TABLET | Freq: Every day | ORAL | 0 refills | 0 days | Status: SS
Start: 2017-06-25 — End: 2017-07-10

## 2017-06-25 MED ORDER — NICOTINE 21 MG/24 HR DAILY TRANSDERMAL PATCH
MEDICATED_PATCH | Freq: Every day | TRANSDERMAL | 0 refills | 0.00000 days | Status: SS
Start: 2017-06-25 — End: 2017-07-10

## 2017-06-30 MED ORDER — DIVALPROEX ER 500 MG TABLET,EXTENDED RELEASE 24 HR
ORAL_TABLET | Freq: Every day | ORAL | 1 refills | 0.00000 days | Status: CP
Start: 2017-06-30 — End: 2017-07-08

## 2017-06-30 MED ORDER — ARIPIPRAZOLE 10 MG TABLET
ORAL_TABLET | Freq: Every day | ORAL | 1 refills | 0 days | Status: CP
Start: 2017-06-30 — End: 2017-07-08

## 2017-07-07 ENCOUNTER — Inpatient Hospital Stay
Admission: EM | Admit: 2017-07-07 | Discharge: 2017-07-14 | Disposition: A | Discharge status: Home / Self Care | Payer: MEDICARE | Source: Intra-hospital | Attending: Psychiatry | Admitting: Psychiatry

## 2017-07-07 ENCOUNTER — Inpatient Hospital Stay
Admission: EM | Admit: 2017-07-07 | Discharge: 2017-07-14 | Disposition: A | Discharge status: Home / Self Care | Payer: MEDICARE | Source: Intra-hospital

## 2017-07-08 DIAGNOSIS — F259 Schizoaffective disorder, unspecified: Principal | ICD-10-CM

## 2017-07-08 MED ORDER — ARIPIPRAZOLE 10 MG TABLET
ORAL_TABLET | Freq: Every day | ORAL | 1 refills | 0 days | Status: CP
Start: 2017-07-08 — End: 2017-07-13

## 2017-07-08 MED ORDER — DIVALPROEX ER 500 MG TABLET,EXTENDED RELEASE 24 HR
ORAL_TABLET | Freq: Every day | ORAL | 1 refills | 0 days | Status: CP
Start: 2017-07-08 — End: 2017-07-13

## 2017-07-13 MED ORDER — AMLODIPINE 10 MG TABLET
ORAL_TABLET | ORAL | 0 refills | 0 days
Start: 2017-07-13 — End: 2017-07-13

## 2017-07-13 MED ORDER — ARIPIPRAZOLE 10 MG TABLET
ORAL_TABLET | ORAL | 0 refills | 0 days
Start: 2017-07-13 — End: 2017-07-13

## 2017-07-13 MED ORDER — METFORMIN 1,000 MG TABLET: 1000 mg | each | 0 refills | 0 days

## 2017-07-13 MED ORDER — ALBUTEROL SULFATE HFA 90 MCG/ACTUATION AEROSOL INHALER: 2 | Inhaler | Freq: Four times a day (QID) | 0 refills | 0 days | Status: AC

## 2017-07-13 MED ORDER — ALBUTEROL SULFATE HFA 90 MCG/ACTUATION AEROSOL INHALER
Freq: Four times a day (QID) | RESPIRATORY_TRACT | 0 refills | 0.00000 days | Status: CP | PRN
Start: 2017-07-13 — End: 2017-07-13

## 2017-07-13 MED ORDER — NICOTINE 21 MG/24 HR DAILY TRANSDERMAL PATCH
0 refills | 0 days
Start: 2017-07-13 — End: 2017-07-13

## 2017-07-13 MED ORDER — ATORVASTATIN 80 MG TABLET: 80 mg | tablet | Freq: Every day | 0 refills | 0 days | Status: AC

## 2017-07-13 MED ORDER — ARIPIPRAZOLE 10 MG TABLET: 10 mg | tablet | Freq: Every day | 0 refills | 0 days | Status: AC

## 2017-07-13 MED ORDER — METFORMIN 1,000 MG TABLET
Freq: Two times a day (BID) | ORAL | 0 refills | 0.00000 days | Status: CP
Start: 2017-07-13 — End: 2017-07-13

## 2017-07-13 MED ORDER — LISINOPRIL 40 MG TABLET
ORAL_TABLET | ORAL | 0 refills | 0 days
Start: 2017-07-13 — End: 2017-07-13

## 2017-07-13 MED ORDER — DIVALPROEX ER 500 MG TABLET,EXTENDED RELEASE 24 HR
ORAL_TABLET | ORAL | 0 refills | 0 days
Start: 2017-07-13 — End: 2017-07-13

## 2017-07-13 MED ORDER — ATORVASTATIN 80 MG TABLET
ORAL_TABLET | ORAL | 0 refills | 0 days
Start: 2017-07-13 — End: 2017-07-13

## 2017-07-13 MED FILL — NICOTINE TRANSDERM/21MG/24HR/PAT: NICOTINE TRANSDERM/21MG/24HR/PAT | 28 days supply | Qty: 2 | Fill #0

## 2017-07-13 MED FILL — ATORVASTATIN CALCIUM/80MG/TABS: ATORVASTATIN CALCIUM/80MG/TABS | 30 days supply | Qty: 30 | Fill #0

## 2017-07-13 MED FILL — LISINOPRIL/40MG/TABS: LISINOPRIL/40MG/TABS | 30 days supply | Qty: 30 | Fill #0

## 2017-07-13 MED FILL — METFORMIN HCL/1000MG/TABS: METFORMIN HCL/1000MG/TABS | 30 days supply | Qty: 60 | Fill #0

## 2017-07-13 MED FILL — AMLODIPINE BESYLATE/10MG/TABS: AMLODIPINE BESYLATE/10MG/TABS | 30 days supply | Qty: 30 | Fill #0

## 2017-07-14 MED ORDER — METFORMIN 1,000 MG TABLET
ORAL_TABLET | Freq: Two times a day (BID) | ORAL | 0 refills | 0 days | Status: CP
Start: 2017-07-14 — End: 2017-11-14

## 2017-07-14 MED ORDER — ATORVASTATIN 80 MG TABLET
ORAL_TABLET | Freq: Every day | ORAL | 0 refills | 0.00000 days | Status: CP
Start: 2017-07-14 — End: 2017-07-14

## 2017-07-14 MED ORDER — ALBUTEROL SULFATE HFA 90 MCG/ACTUATION AEROSOL INHALER
Freq: Four times a day (QID) | RESPIRATORY_TRACT | 0 refills | 0 days | Status: CP | PRN
Start: 2017-07-14 — End: 2017-11-14

## 2017-07-14 MED ORDER — NICOTINE 21 MG/24 HR DAILY TRANSDERMAL PATCH: 1 | patch | Freq: Every day | 0 refills | 0 days | Status: AC

## 2017-07-14 MED ORDER — LISINOPRIL 40 MG TABLET: 40 mg | tablet | Freq: Every day | 0 refills | 0 days | Status: AC

## 2017-07-14 MED ORDER — DIVALPROEX ER 500 MG TABLET,EXTENDED RELEASE 24 HR
ORAL_TABLET | Freq: Every day | ORAL | 0 refills | 0.00000 days | Status: CP
Start: 2017-07-14 — End: 2017-11-14

## 2017-07-14 MED ORDER — AMLODIPINE 10 MG TABLET
ORAL_TABLET | Freq: Every day | ORAL | 0 refills | 0.00000 days | Status: CP
Start: 2017-07-14 — End: 2017-11-14

## 2017-07-14 MED ORDER — ARIPIPRAZOLE 10 MG TABLET
ORAL_TABLET | Freq: Every day | ORAL | 0 refills | 0.00000 days | Status: CP
Start: 2017-07-14 — End: 2017-11-07

## 2017-07-14 MED ORDER — NICOTINE 21 MG/24 HR DAILY TRANSDERMAL PATCH
MEDICATED_PATCH | Freq: Every day | TRANSDERMAL | 0 refills | 0 days | Status: CP
Start: 2017-07-14 — End: 2017-11-14

## 2017-07-14 MED ORDER — AMLODIPINE 10 MG TABLET: 10 mg | tablet | Freq: Every day | 0 refills | 0 days | Status: AC

## 2017-07-14 MED ORDER — DIVALPROEX ER 500 MG TABLET,EXTENDED RELEASE 24 HR: 500 mg | tablet | Freq: Every day | 0 refills | 0 days | Status: AC

## 2017-07-14 MED ORDER — LISINOPRIL 40 MG TABLET
ORAL_TABLET | Freq: Every day | ORAL | 0 refills | 0.00000 days | Status: CP
Start: 2017-07-14 — End: 2017-07-14

## 2017-07-19 ENCOUNTER — Emergency Department
Admission: EM | Admit: 2017-07-19 | Discharge: 2017-07-20 | Disposition: A | Discharge status: Home / Self Care | Payer: MEDICARE | Source: Intra-hospital | Admitting: Emergency Medicine

## 2017-07-21 ENCOUNTER — Emergency Department
Admission: EM | Admit: 2017-07-21 | Discharge: 2017-07-22 | Disposition: A | Discharge status: Other Acute Inpatient Hospital | Payer: Medicare HMO | Attending: Emergency Medicine | Admitting: Emergency Medicine

## 2017-07-21 ENCOUNTER — Encounter: Payer: Self-pay | Admitting: Emergency Medicine

## 2017-07-21 DIAGNOSIS — F29 Unspecified psychosis not due to a substance or known physiological condition: Secondary | ICD-10-CM

## 2017-07-21 DIAGNOSIS — E119 Type 2 diabetes mellitus without complications: Secondary | ICD-10-CM

## 2017-07-21 DIAGNOSIS — F250 Schizoaffective disorder, bipolar type: Secondary | ICD-10-CM | POA: Diagnosis not present

## 2017-07-21 DIAGNOSIS — F209 Schizophrenia, unspecified: Secondary | ICD-10-CM

## 2017-07-21 DIAGNOSIS — I10 Essential (primary) hypertension: Secondary | ICD-10-CM | POA: Diagnosis present

## 2017-07-21 DIAGNOSIS — E785 Hyperlipidemia, unspecified: Secondary | ICD-10-CM | POA: Diagnosis present

## 2017-07-21 DIAGNOSIS — Z7982 Long term (current) use of aspirin: Secondary | ICD-10-CM | POA: Insufficient documentation

## 2017-07-21 DIAGNOSIS — Z7984 Long term (current) use of oral hypoglycemic drugs: Secondary | ICD-10-CM | POA: Insufficient documentation

## 2017-07-21 DIAGNOSIS — F17200 Nicotine dependence, unspecified, uncomplicated: Secondary | ICD-10-CM | POA: Diagnosis present

## 2017-07-21 DIAGNOSIS — Z79899 Other long term (current) drug therapy: Secondary | ICD-10-CM | POA: Insufficient documentation

## 2017-07-21 LAB — COMPREHENSIVE METABOLIC PANEL
ALBUMIN: 4.3 g/dL (ref 3.5–5.0)
ALT: 22 U/L (ref 17–63)
ANION GAP: 8 (ref 5–15)
AST: 25 U/L (ref 15–41)
Alkaline Phosphatase: 73 U/L (ref 38–126)
BILIRUBIN TOTAL: 1 mg/dL (ref 0.3–1.2)
BUN: 15 mg/dL (ref 6–20)
CO2: 22 mmol/L (ref 22–32)
Calcium: 8.9 mg/dL (ref 8.9–10.3)
Chloride: 104 mmol/L (ref 101–111)
Creatinine, Ser: 1.28 mg/dL — ABNORMAL HIGH (ref 0.61–1.24)
GLUCOSE: 131 mg/dL — AB (ref 65–99)
POTASSIUM: 3.6 mmol/L (ref 3.5–5.1)
Sodium: 134 mmol/L — ABNORMAL LOW (ref 135–145)
TOTAL PROTEIN: 7 g/dL (ref 6.5–8.1)

## 2017-07-21 LAB — CBC
HCT: 42.5 % (ref 40.0–52.0)
Hemoglobin: 14.3 g/dL (ref 13.0–18.0)
MCH: 28 pg (ref 26.0–34.0)
MCHC: 33.7 g/dL (ref 32.0–36.0)
MCV: 83.2 fL (ref 80.0–100.0)
Platelets: 254 10*3/uL (ref 150–440)
RBC: 5.1 MIL/uL (ref 4.40–5.90)
RDW: 14 % (ref 11.5–14.5)
WBC: 10.7 10*3/uL — AB (ref 3.8–10.6)

## 2017-07-21 LAB — URINE DRUG SCREEN, QUALITATIVE (ARMC ONLY)
AMPHETAMINES, UR SCREEN: NOT DETECTED
BARBITURATES, UR SCREEN: NOT DETECTED
BENZODIAZEPINE, UR SCRN: NOT DETECTED
Cannabinoid 50 Ng, Ur ~~LOC~~: NOT DETECTED
Cocaine Metabolite,Ur ~~LOC~~: NOT DETECTED
MDMA (Ecstasy)Ur Screen: NOT DETECTED
METHADONE SCREEN, URINE: NOT DETECTED
Opiate, Ur Screen: NOT DETECTED
Phencyclidine (PCP) Ur S: NOT DETECTED
TRICYCLIC, UR SCREEN: NOT DETECTED

## 2017-07-21 LAB — ACETAMINOPHEN LEVEL

## 2017-07-21 LAB — SALICYLATE LEVEL

## 2017-07-21 LAB — ETHANOL

## 2017-07-21 MED ORDER — NICOTINE 21 MG/24HR TD PT24
MEDICATED_PATCH | TRANSDERMAL | Status: AC
  Administered 2017-07-21: 21 mg via TRANSDERMAL
  Filled 2017-07-21: qty 1

## 2017-07-21 MED ORDER — LISINOPRIL 10 MG PO TABS: 20.0000 mg | ORAL_TABLET | Freq: Every day | ORAL | Status: DC

## 2017-07-21 MED ORDER — NICOTINE 21 MG/24HR TD PT24
21.0000 mg | MEDICATED_PATCH | Freq: Every day | TRANSDERMAL | Status: DC
  Administered 2017-07-21: 21 mg via TRANSDERMAL

## 2017-07-21 MED ORDER — LISINOPRIL 10 MG PO TABS
ORAL_TABLET | ORAL | Status: AC
  Filled 2017-07-21: qty 4

## 2017-07-21 MED ORDER — DIVALPROEX SODIUM 500 MG PO DR TAB
DELAYED_RELEASE_TABLET | ORAL | Status: AC
  Administered 2017-07-21: 1500 mg via ORAL
  Filled 2017-07-21: qty 3

## 2017-07-21 MED ORDER — ASPIRIN EC 81 MG PO TBEC
DELAYED_RELEASE_TABLET | ORAL | Status: AC
  Filled 2017-07-21: qty 1

## 2017-07-21 MED ORDER — TRAZODONE HCL 100 MG PO TABS
ORAL_TABLET | ORAL | Status: AC
  Administered 2017-07-21: 100 mg via ORAL
  Filled 2017-07-21: qty 1

## 2017-07-21 MED ORDER — ATORVASTATIN CALCIUM 20 MG PO TABS: 80.0000 mg | ORAL_TABLET | Freq: Every day | ORAL | Status: DC

## 2017-07-21 MED ORDER — ASPIRIN EC 81 MG PO TBEC: 81.0000 mg | DELAYED_RELEASE_TABLET | Freq: Every day | ORAL | Status: DC

## 2017-07-21 MED ORDER — TRAZODONE HCL 100 MG PO TABS
100.0000 mg | ORAL_TABLET | Freq: Every day | ORAL | Status: DC
  Administered 2017-07-21: 100 mg via ORAL

## 2017-07-21 MED ORDER — LISINOPRIL 10 MG PO TABS
40.0000 mg | ORAL_TABLET | Freq: Every day | ORAL | Status: DC
Start: 2017-07-21 — End: 2017-07-22

## 2017-07-21 MED ORDER — DIVALPROEX SODIUM 500 MG PO DR TAB
1500.0000 mg | DELAYED_RELEASE_TABLET | Freq: Every day | ORAL | Status: DC
  Administered 2017-07-21: 1500 mg via ORAL
  Filled 2017-07-21: qty 3

## 2017-07-21 MED ORDER — METFORMIN HCL 500 MG PO TABS: 1000.0000 mg | ORAL_TABLET | Freq: Two times a day (BID) | ORAL | Status: DC

## 2017-07-21 MED ORDER — ARIPIPRAZOLE 5 MG PO TABS: 10.0000 mg | ORAL_TABLET | Freq: Every day | ORAL | Status: DC

## 2017-07-21 MED ORDER — AMLODIPINE BESYLATE 5 MG PO TABS: 10.0000 mg | ORAL_TABLET | Freq: Every day | ORAL | Status: DC

## 2017-07-21 MED ORDER — AMLODIPINE BESYLATE 5 MG PO TABS
ORAL_TABLET | ORAL | Status: AC
  Filled 2017-07-21: qty 2

## 2017-07-21 NOTE — ED Triage Notes (Addendum)
Pt to ED by [employer] deputy, per officer pt called 911 to report a missing person report of his mother. Per officer pt had bizarre behavior upon arrival, walls had random writing and diagrams throughout the house. Pt states " I just want to figure out what happened in my past." Pt ambulatory, denies any ETOH/ drug use, pt denies any SI/HI. Pt denies hallucinations

## 2017-07-21 NOTE — BH Assessment (Addendum)
[facility] Dr. Toni Amendlapacs Attending Physician will be Dr. Mikey CollegeMcKnew.   [facility] Lorenda HatchetSlade

## 2017-07-21 NOTE — ED Notes (Signed)
Pt c/o RT hand pain, swelling and redness noted. Pt states " I pulled my dog off another dog"

## 2017-07-21 NOTE — Consult Note (Signed)
[place] [facility] Consult   Reason for Consult: Consult for 41 year old man with a history of schizoaffective disorder who presented to the emergency room with law enforcement today. Referring Physician: Paduchowski Patient Identification: Henry Shaffer MRN:  [id number] Principal Diagnosis: Schizoaffective disorder, bipolar type [facility]) Diagnosis:   Patient Active Problem List   Diagnosis Date Noted  . Asthma [J45.909] 02/14/2017  . HTN (hypertension) [I10] 02/13/2017  . Dyslipidemia [E78.5] 02/13/2017  . Diabetes ([place]) [E11.9] 02/13/2017  . Tobacco use disorder [F17.200] 02/13/2017  . Schizoaffective disorder, bipolar type ([place]) [F25.0] 06/29/2012  . Decreased vision [H54.7] 05/08/2012  . Conductive hearing loss, middle ear [H90.2] 11/02/2011    Total Time spent with patient: 1 hour  Subjective:   Henry Shaffer is a 41 y.o. male patient admitted with "I am scared and hurt" patient interviewed chart reviewed.  41 year old man with a past history of schizoaffective disorder.  Apparently he called 911 today saying he wanted to report a missing person.  When Sheriff's got there he said the missing person was his mother but he seemed to be acting very strangely.  They thought his behavior was bizarre and so they brought him to the emergency room.Marland Kitchen  HPI: Patient is not a very reliable historian although he is making an effort to be cooperative.  He claims that he has been compliant with his medicine which is Abilify and Depakote.  Nevertheless he says his mood has been frightened.  When I ask him what he is frightened of he becomes disorganized and starts talking about people from the past and making clang associations.  Patient says his mood is okay.  He looks nervous however and withdrawn.  He denies suicidal or homicidal ideation.  He makes paranoid statements saying he believes someone is trying to hurt him or his mother but again his description of this is bizarre and  disorganized.  Denies that he has been using any drugs.  Denies that he has been drinking recently.  Does not report any particular new stresses.  Apparently when the sheriffs got to his house they found that he had been writing all over the walls lots of bizarre stuff and making bizarre comments but not really behaving in a threatening way.  Medical history: Patient has a history of high blood pressure that has been difficult to control and as a result has had at least one known myocardial infarction.  He also has diabetes controlled with oral medicines and dyslipidemia.  Has a history of conductive hearing loss as well although there seems to be no trouble communicating with him in a normal tone of voice.  Social history: Never married he lives with his mother.  Substance abuse history: Reports in the old chart indicated occasionally he has used marijuana.  He denies that he has been using any drugs recently.  Denies that he has been using alcohol.  The drug screen is still pending.  Past Psychiatric History: Patient has a diagnosis of schizoaffective disorder and apparently has had several prior hospitalizations.  He was here during the summer of this last year for treatment.  Looking at the old records I see that he has had at least one hospitalization at [facility] psychiatric ward just a couple weeks ago.  His medications seem to be Abilify and Depakote most recently.  Patient also mentions that he has taken Risperdal in the past.  He denies ever trying to kill himself in the past there is no clear history of any violent  behavior.  Risk to Self: Is patient at risk for suicide?: No Risk to Others:   Prior Inpatient Therapy:   Prior Outpatient Therapy:    Past Medical History:  Past Medical History:  Diagnosis Date  . Hypertension     Past Surgical History:  Procedure Laterality Date  . CARDIAC CATHETERIZATION     Family History: No family history on file. Family Psychiatric  History: He says  he has an aunt with schizophrenia Social History:  Social History   Substance and Sexual Activity  Alcohol Use Yes  . Alcohol/week: 0.6 oz  . Types: 1 Cans of beer per week   Comment: occassionally     Social History   Substance and Sexual Activity  Drug Use Yes  . Types: Marijuana   Comment: liquid marijuana 2 weeks ago    Social History   Socioeconomic History  . Marital status: Single    Spouse name: None  . Number of children: None  . Years of education: None  . Highest education level: None  Social Needs  . Financial resource strain: None  . Food insecurity - worry: None  . Food insecurity - inability: None  . Transportation needs - medical: None  . Transportation needs - non-medical: None  Occupational History  . None  Tobacco Use  . Smoking status: Current Every Day Smoker    Packs/day: 2.00  . Smokeless tobacco: Never Used  Substance and Sexual Activity  . Alcohol use: Yes    Alcohol/week: 0.6 oz    Types: 1 Cans of beer per week    Comment: occassionally  . Drug use: Yes    Types: Marijuana    Comment: liquid marijuana 2 weeks ago  . Sexual activity: No  Other Topics Concern  . None  Social History Narrative  . None   Additional Social History:    Allergies:   Allergies  Allergen Reactions  . Codeine     Unknown. Pt's father reported this in the past, but pt doesn't know his reaction.  . Erythromycin Other (See Comments)    Unknown. Pt's father reported this in the past, but pt doesn't know his reaction.  . Sulfa Antibiotics     Unknown. Pt's father reported this in the past, but pt doesn't know his reaction.    Labs:  Results for orders placed or performed during the hospital encounter of 07/21/17 (from the past 48 hour(s))  Comprehensive metabolic panel     Status: Abnormal   Collection Time: 07/21/17  6:35 PM  Result Value Ref Range   Sodium 134 (L) 135 - 145 mmol/L   Potassium 3.6 3.5 - 5.1 mmol/L   Chloride 104 101 - 111 mmol/L    CO2 22 22 - 32 mmol/L   Glucose, Bld 131 (H) 65 - 99 mg/dL   BUN 15 6 - 20 mg/dL   Creatinine, Ser 1.28 (H) 0.61 - 1.24 mg/dL   Calcium 8.9 8.9 - 10.3 mg/dL   Total Protein 7.0 6.5 - 8.1 g/dL   Albumin 4.3 3.5 - 5.0 g/dL   AST 25 15 - 41 U/L   ALT 22 17 - 63 U/L   Alkaline Phosphatase 73 38 - 126 U/L   Total Bilirubin 1.0 0.3 - 1.2 mg/dL   GFR calc non Af Amer >60 >60 mL/min   GFR calc Af Amer >60 >60 mL/min    Comment: (NOTE) The eGFR has been calculated using the CKD EPI equation. This calculation has not been validated  in all clinical situations. eGFR's persistently <60 mL/min signify possible Chronic Kidney Disease.    Anion gap 8 5 - 15    Comment: Performed at [facility], [address] [place], [place] [address]  Ethanol     Status: None   Collection Time: 07/21/17  6:35 PM  Result Value Ref Range   Alcohol, Ethyl (B) <10 <10 mg/dL    Comment:        LOWEST DETECTABLE LIMIT FOR SERUM ALCOHOL IS 10 mg/dL FOR MEDICAL PURPOSES ONLY Performed at [facility], [address], [place], [place] [address]   Salicylate level     Status: None   Collection Time: 07/21/17  6:35 PM  Result Value Ref Range   Salicylate Lvl <7.9 2.8 - 30.0 mg/dL    Comment: Performed at [facility], [address] [place], [place] [address]  Acetaminophen level     Status: Abnormal   Collection Time: 07/21/17  6:35 PM  Result Value Ref Range   Acetaminophen (Tylenol), Serum <10 (L) 10 - 30 ug/mL    Comment:        THERAPEUTIC CONCENTRATIONS VARY SIGNIFICANTLY. A RANGE OF 10-30 ug/mL MAY BE AN EFFECTIVE CONCENTRATION FOR MANY PATIENTS. HOWEVER, SOME ARE BEST TREATED AT CONCENTRATIONS OUTSIDE THIS RANGE. ACETAMINOPHEN CONCENTRATIONS >150 ug/mL AT 4 HOURS AFTER INGESTION AND >50 ug/mL AT 12 HOURS AFTER INGESTION ARE OFTEN ASSOCIATED WITH TOXIC REACTIONS. Performed at [facility], [address] [place], [place] [address]   cbc     Status:  Abnormal   Collection Time: 07/21/17  6:35 PM  Result Value Ref Range   WBC 10.7 (H) 3.8 - 10.6 K/uL   RBC 5.10 4.40 - 5.90 MIL/uL   Hemoglobin 14.3 13.0 - 18.0 g/dL   HCT 42.5 40.0 - 52.0 %   MCV 83.2 80.0 - 100.0 fL   MCH 28.0 26.0 - 34.0 pg   MCHC 33.7 32.0 - 36.0 g/dL   RDW 14.0 11.5 - 14.5 %   Platelets 254 150 - 440 K/uL    Comment: Performed at [facility], [address] [place], [place] [address]    Current Facility-Administered Medications  Medication Dose Route Frequency Provider Last Rate Last Dose  . amLODipine (NORVASC) tablet 10 mg  10 mg Oral Daily Clapacs, John T, MD      . ARIPiprazole (ABILIFY) tablet 10 mg  10 mg Oral Daily Clapacs, John T, MD      . aspirin EC tablet 81 mg  81 mg Oral Daily Clapacs, Madie Reno, MD      . Derrill Memo ON 07/22/2017] atorvastatin (LIPITOR) tablet 80 mg  80 mg Oral q1800 Clapacs, John T, MD      . divalproex (DEPAKOTE ER) 24 hr tablet 1,500 mg  1,500 mg Oral QHS Clapacs, John T, MD      . lisinopril (PRINIVIL,ZESTRIL) tablet 40 mg  40 mg Oral Daily Clapacs, John T, MD      . Derrill Memo ON 07/22/2017] metFORMIN (GLUCOPHAGE) tablet 1,000 mg  1,000 mg Oral BID WC Clapacs, John T, MD      . nicotine (NICODERM CQ - dosed in mg/24 hours) patch 21 mg  21 mg Transdermal Daily Clapacs, John T, MD      . traZODone (DESYREL) tablet 100 mg  100 mg Oral QHS Clapacs, Madie Reno, MD       Current Outpatient Medications  Medication Sig Dispense Refill  . albuterol (PROVENTIL HFA;VENTOLIN HFA) 108 (90 Base)  MCG/ACT inhaler Inhale 2 puffs into the lungs every 6 (six) hours as needed for wheezing or shortness of breath.     Marland Kitchen amLODipine (NORVASC) 10 MG tablet Take 1 tablet (10 mg total) by mouth daily. 30 tablet 0  . ARIPiprazole (ABILIFY) 10 MG tablet Take 1 tablet (10 mg total) by mouth daily. 30 tablet 0  . aspirin 81 MG chewable tablet Chew 81 mg by mouth daily.     Marland Kitchen atorvastatin (LIPITOR) 80 MG tablet Take 1 tablet (80 mg total) by mouth daily. 30  tablet 0  . divalproex (DEPAKOTE ER) 500 MG 24 hr tablet Take 3 tablets (1,500 mg total) by mouth daily. 90 tablet 0  . lisinopril (PRINIVIL,ZESTRIL) 20 MG tablet Take 1 tablet (20 mg total) by mouth daily. 30 tablet 0  . metFORMIN (GLUCOPHAGE) 1000 MG tablet Take 1 tablet (1,000 mg total) by mouth 2 (two) times daily. 60 tablet 0  . traZODone (DESYREL) 100 MG tablet Take 1 tablet (100 mg total) by mouth at bedtime. 30 tablet 0    Musculoskeletal: Strength & Muscle Tone: within normal limits Gait & Station: normal Patient leans: N/A  Psychiatric Specialty Exam: Physical Exam  Nursing note and vitals reviewed. Constitutional: He appears well-developed and well-nourished.  HENT:  Head: Normocephalic and atraumatic.  Eyes: Conjunctivae are normal. Pupils are equal, round, and reactive to light.  Neck: Normal range of motion.  Cardiovascular: Regular rhythm and normal heart sounds.  Respiratory: Effort normal. No respiratory distress.  GI: Soft. He exhibits no distension.  Musculoskeletal: Normal range of motion.  Neurological: He is alert.  Skin: Skin is warm and dry.  Psychiatric: His affect is blunt. His speech is delayed. He is slowed and withdrawn. Thought content is paranoid and delusional. Cognition and memory are impaired. He expresses impulsivity. He exhibits a depressed mood. He expresses no homicidal and no suicidal ideation.    Review of Systems  Constitutional: Negative.   HENT: Negative.   Eyes: Negative.   Respiratory: Negative.   Cardiovascular: Negative.   Gastrointestinal: Negative.   Musculoskeletal: Negative.   Skin: Negative.   Neurological: Negative.   Psychiatric/Behavioral: Positive for depression and memory loss. Negative for hallucinations, substance abuse and suicidal ideas. The patient is nervous/anxious and has insomnia.     Blood pressure (!) 174/103, pulse 97, resp. rate 16, height 5' 11"  (1.803 m), weight 230 lb (104.3 kg), SpO2 97 %.Body mass index  is 32.08 kg/m.  General Appearance: Fairly Groomed  Eye Contact:  Minimal  Speech:  Slow  Volume:  Decreased  Mood:  Dysphoric  Affect:  Constricted  Thought Process:  Disorganized  Orientation:  Full (Time, Place, and Person)  Thought Content:  Illogical, Paranoid Ideation and Rumination  Suicidal Thoughts:  No  Homicidal Thoughts:  No  Memory:  Immediate;   Good Recent;   Fair Remote;   Fair  Judgement:  Fair  Insight:  Fair  Psychomotor Activity:  Decreased  Concentration:  Concentration: Fair  Recall:  [employer] of Knowledge:  Fair  Language:  Fair  Akathisia:  No  Handed:  Right  AIMS (if indicated):     Assets:  Desire for Improvement Financial Resources/Insurance Housing Resilience Social Support  ADL's:  Intact  Cognition:  Impaired,  Mild  Sleep:        Treatment Plan Summary: Daily contact with patient to assess and evaluate symptoms and progress in treatment, Medication management and Plan 41 year old man with schizoaffective disorder.  Currently he  is psychotic disorganized paranoid but has been cooperative.  He is not agitated or aggressive or making any threats.  On physical examination his right hand is still sore from a recent injury which was noted in previous notes.  His blood pressure is very high which has been a chronic problem suggesting possible noncompliance with medicine.  Plan is to get an EKG and to restart him on his psychiatric medicine as well as his medicine for blood pressure diabetes etc.  Commitment seems to be justified given his psychosis.  Plan is for admission to the inpatient unit.  Already spoke to TTS.  Full set of labs including drug screen will be obtained.  Patient understands the plan.  Disposition: Recommend psychiatric Inpatient admission when medically cleared. Supportive therapy provided about ongoing stressors. Discussed crisis plan, support from social network, calling 911, coming to the Emergency Department, and calling  Suicide Hotline.  Alethia Berthold, MD 07/21/2017 7:39 PM

## 2017-07-21 NOTE — ED Provider Notes (Addendum)
[facility] Emergency Department Provider Note  Time seen: 7:01 PM  I have reviewed the triage vital signs and the nursing notes.   HISTORY  Chief Complaint Psychiatric Evaluation (voluntary )    HPI Henry Kluverlliot R Figuero is a 41 y.o. male with a past medical history of hypertension, schizoaffective disorder, diabetes, presents to the emergency department for bizarre behavior.  According to the triage note [facility] as the patient was found to be writing over his walls with bizarre behavior.  Here the patient admits to writing on the walls states he was trying to record his "history."  Patient admits to not sleeping in several days.  Denies drugs or alcohol use.  Patient is acting very bizarre in the emergency department with an incoherent thought process.  Denies any medical complaints tonight.  Negative review of systems.  Past Medical History:  Diagnosis Date  . Hypertension     Patient Active Problem List   Diagnosis Date Noted  . Asthma 02/14/2017  . HTN (hypertension) 02/13/2017  . Dyslipidemia 02/13/2017  . Diabetes ([facility]) 02/13/2017  . Tobacco use disorder 02/13/2017  . Schizoaffective disorder, bipolar type ([facility]) 06/29/2012  . Decreased vision 05/08/2012  . Conductive hearing loss, middle ear 11/02/2011    Past Surgical History:  Procedure Laterality Date  . CARDIAC CATHETERIZATION      Prior to Admission medications   Medication Sig Start Date End Date Taking? Authorizing Provider  albuterol (PROVENTIL HFA;VENTOLIN HFA) 108 (90 Base) MCG/ACT inhaler Inhale 2 puffs into the lungs every 6 (six) hours as needed for wheezing or shortness of breath.     [provider]  amLODipine (NORVASC) 10 MG tablet Take 1 tablet (10 mg total) by mouth daily. 02/19/17   Jimmy FootmanHernandez-Gonzalez, Andrea, MD  ARIPiprazole (ABILIFY) 10 MG tablet Take 1 tablet (10 mg total) by mouth daily. 02/20/17   Jimmy FootmanHernandez-Gonzalez,  Andrea, MD  aspirin 81 MG chewable tablet Chew 81 mg by mouth daily.     [provider]  atorvastatin (LIPITOR) 80 MG tablet Take 1 tablet (80 mg total) by mouth daily. 02/19/17   Jimmy FootmanHernandez-Gonzalez, Andrea, MD  divalproex (DEPAKOTE ER) 500 MG 24 hr tablet Take 3 tablets (1,500 mg total) by mouth daily. 02/20/17   Jimmy FootmanHernandez-Gonzalez, Andrea, MD  lisinopril (PRINIVIL,ZESTRIL) 20 MG tablet Take 1 tablet (20 mg total) by mouth daily. 02/20/17   Jimmy FootmanHernandez-Gonzalez, Andrea, MD  metFORMIN (GLUCOPHAGE) 1000 MG tablet Take 1 tablet (1,000 mg total) by mouth 2 (two) times daily. 02/19/17   Jimmy FootmanHernandez-Gonzalez, Andrea, MD  traZODone (DESYREL) 100 MG tablet Take 1 tablet (100 mg total) by mouth at bedtime. 02/19/17   Jimmy FootmanHernandez-Gonzalez, Andrea, MD    Allergies  Allergen Reactions  . Codeine     Unknown. Pt's father reported this in the past, but pt doesn't know his reaction.  . Erythromycin Other (See Comments)    Unknown. Pt's father reported this in the past, but pt doesn't know his reaction.  . Sulfa Antibiotics     Unknown. Pt's father reported this in the past, but pt doesn't know his reaction.    No family history on file.  Social History Social History   Tobacco Use  . Smoking status: Current Every Day Smoker    Packs/day: 2.00  . Smokeless tobacco: Never Used  Substance Use Topics  . Alcohol use: Yes    Alcohol/week: 0.6 oz    Types: 1 Cans of beer per week  Comment: occassionally  . Drug use: Yes    Types: Marijuana    Comment: liquid marijuana 2 weeks ago    Review of Systems Constitutional: Negative for fever. Eyes: Negative for visual changes. ENT: Negative for congestion Cardiovascular: Negative for chest pain. Respiratory: Negative for shortness of breath. Gastrointestinal: Negative for abdominal pain, vomiting Genitourinary: Negative for dysuria. Musculoskeletal: Negative for back pain. Skin: Negative for rash. Neurological: Negative for headache All other  ROS negative  ____________________________________________   PHYSICAL EXAM:  VITAL SIGNS: ED Triage Vitals  Enc Vitals Group     BP 07/21/17 1837 (!) 174/103     Pulse Rate 07/21/17 1836 97     Resp 07/21/17 1836 16     Temp --      Temp Source 07/21/17 1836 Oral     SpO2 07/21/17 1836 97 %     Weight 07/21/17 1836 230 lb (104.3 kg)     Height 07/21/17 1836 5\' 11"  (1.803 m)     Head Circumference --      Peak Flow --      Pain Score 07/21/17 1835 8     Pain Loc --      Pain Edu? --      Excl. in GC? --    Constitutional: Alert. Well appearing and in no distress. Eyes: Normal exam ENT   Head: Normocephalic and atraumatic.   Mouth/Throat: Mucous membranes are moist. Cardiovascular: Normal rate, regular rhythm. No murmur Respiratory: Normal respiratory effort without tachypnea nor retractions. Breath sounds are clear Gastrointestinal: Soft and nontender. No distention.   Musculoskeletal: Nontender with normal range of motion in all extremities.  Neurologic: No gross deficits.  Normal speech but incoherent thought process. Skin:  Skin is warm, dry and intact.  Psychiatric: Tangential thoughts and incoherent thought process.  No SI or HI.  ____________________________________________    EKG  EKG reviewed and interpreted by myself shows sinus rhythm at 74 bpm with a slightly widened QRS, normal axis, largely normal intervals and nonspecific ST changes.  ____________________________________________   INITIAL IMPRESSION / ASSESSMENT AND PLAN / ED COURSE  Pertinent labs & imaging results that were available during my care of the patient were reviewed by me and considered in my medical decision making (see chart for details).  Patient presents to the emergency department for bizarre behavior.  Differential would include psychosis, schizophrenia exacerbation, metabolic abnormality, substance use, infectious etiology.  We will check labs, urine drug screen, ethanol  level and continue to closely monitor in the emergency department.  Overall the patient appears well, no distress but is acting quite bizarre admits to insomnia highly suspect schizophrenia exacerbation/psychosis.  We will will place the patient on her IVC until psychiatry can adequately evaluate.  Labs are largely within normal limits.  Urine drug screen pending.  Patient has been seen by psychiatry and they will be recommending inpatient admission for the patient.    ____________________________________________   FINAL CLINICAL IMPRESSION(S) / ED DIAGNOSES  Schizophrenia    Minna AntisPaduchowski, Coletta Lockner, MD 07/21/17 2126    Minna AntisPaduchowski, Darriel Sinquefield, MD 07/21/17 [phone]

## 2017-07-21 NOTE — ED Notes (Signed)
Dr. Clapacs to bedside at this time.  

## 2017-07-22 ENCOUNTER — Encounter: Payer: Self-pay | Admitting: Psychiatry

## 2017-07-22 ENCOUNTER — Other Ambulatory Visit: Payer: Self-pay

## 2017-07-22 ENCOUNTER — Inpatient Hospital Stay
Admission: AD | Admit: 2017-07-22 | Discharge: 2017-08-01 | DRG: 885 | Disposition: A | Discharge status: Home / Self Care | Payer: Medicare HMO | Attending: Psychiatry | Admitting: Psychiatry

## 2017-07-22 DIAGNOSIS — G4700 Insomnia, unspecified: Secondary | ICD-10-CM | POA: Diagnosis present

## 2017-07-22 DIAGNOSIS — Z882 Allergy status to sulfonamides status: Secondary | ICD-10-CM | POA: Diagnosis not present

## 2017-07-22 DIAGNOSIS — Z7984 Long term (current) use of oral hypoglycemic drugs: Secondary | ICD-10-CM | POA: Diagnosis not present

## 2017-07-22 DIAGNOSIS — F251 Schizoaffective disorder, depressive type: Principal | ICD-10-CM | POA: Diagnosis present

## 2017-07-22 DIAGNOSIS — F17210 Nicotine dependence, cigarettes, uncomplicated: Secondary | ICD-10-CM | POA: Diagnosis present

## 2017-07-22 DIAGNOSIS — E785 Hyperlipidemia, unspecified: Secondary | ICD-10-CM | POA: Diagnosis present

## 2017-07-22 DIAGNOSIS — F4010 Social phobia, unspecified: Secondary | ICD-10-CM | POA: Diagnosis present

## 2017-07-22 DIAGNOSIS — F309 Manic episode, unspecified: Secondary | ICD-10-CM | POA: Diagnosis present

## 2017-07-22 DIAGNOSIS — Z79899 Other long term (current) drug therapy: Secondary | ICD-10-CM | POA: Diagnosis not present

## 2017-07-22 DIAGNOSIS — R21 Rash and other nonspecific skin eruption: Secondary | ICD-10-CM | POA: Diagnosis present

## 2017-07-22 DIAGNOSIS — I10 Essential (primary) hypertension: Secondary | ICD-10-CM | POA: Diagnosis present

## 2017-07-22 DIAGNOSIS — H6010 Cellulitis of external ear, unspecified ear: Secondary | ICD-10-CM | POA: Diagnosis present

## 2017-07-22 DIAGNOSIS — Z7982 Long term (current) use of aspirin: Secondary | ICD-10-CM

## 2017-07-22 DIAGNOSIS — Z818 Family history of other mental and behavioral disorders: Secondary | ICD-10-CM

## 2017-07-22 DIAGNOSIS — E119 Type 2 diabetes mellitus without complications: Secondary | ICD-10-CM | POA: Diagnosis present

## 2017-07-22 DIAGNOSIS — Z833 Family history of diabetes mellitus: Secondary | ICD-10-CM | POA: Diagnosis not present

## 2017-07-22 DIAGNOSIS — F17200 Nicotine dependence, unspecified, uncomplicated: Secondary | ICD-10-CM | POA: Diagnosis present

## 2017-07-22 HISTORY — DX: Hyperlipidemia, unspecified: E78.5

## 2017-07-22 HISTORY — DX: Depression, unspecified: F32.A

## 2017-07-22 HISTORY — DX: Major depressive disorder, single episode, unspecified: F32.9

## 2017-07-22 HISTORY — DX: Anxiety disorder, unspecified: F41.9

## 2017-07-22 HISTORY — DX: Schizophrenia, unspecified: F20.9

## 2017-07-22 HISTORY — DX: Type 2 diabetes mellitus without complications: E11.9

## 2017-07-22 LAB — LIPID PANEL
Cholesterol: 68 mg/dL (ref 0–200)
HDL: 27 mg/dL — ABNORMAL LOW
LDL Cholesterol: 23 mg/dL (ref 0–99)
Total CHOL/HDL Ratio: 2.5 ratio
Triglycerides: 88 mg/dL
VLDL: 18 mg/dL (ref 0–40)

## 2017-07-22 LAB — URINE DRUG SCREEN, QUALITATIVE (ARMC ONLY)
AMPHETAMINES, UR SCREEN: NOT DETECTED
BARBITURATES, UR SCREEN: NOT DETECTED
BENZODIAZEPINE, UR SCRN: NOT DETECTED
Cannabinoid 50 Ng, Ur ~~LOC~~: NOT DETECTED
Cocaine Metabolite,Ur ~~LOC~~: NOT DETECTED
MDMA (Ecstasy)Ur Screen: NOT DETECTED
METHADONE SCREEN, URINE: NOT DETECTED
Opiate, Ur Screen: NOT DETECTED
Phencyclidine (PCP) Ur S: NOT DETECTED
TRICYCLIC, UR SCREEN: NOT DETECTED

## 2017-07-22 LAB — HEMOGLOBIN A1C
HEMOGLOBIN A1C: 7.5 % — AB (ref 4.8–5.6)
Mean Plasma Glucose: 168.55 mg/dL

## 2017-07-22 LAB — TSH: TSH: 0.929 u[IU]/mL (ref 0.350–4.500)

## 2017-07-22 LAB — VALPROIC ACID LEVEL: Valproic Acid Lvl: 78 ug/mL (ref 50.0–100.0)

## 2017-07-22 MED ORDER — TRAZODONE HCL 100 MG PO TABS: 100.0000 mg | ORAL_TABLET | Freq: Every evening | ORAL | Status: DC | PRN

## 2017-07-22 MED ORDER — ATORVASTATIN CALCIUM 20 MG PO TABS
80.0000 mg | ORAL_TABLET | Freq: Every day | ORAL | Status: DC
  Administered 2017-07-22 – 2017-07-31 (×10): 80 mg via ORAL
  Filled 2017-07-22 (×10): qty 4

## 2017-07-22 MED ORDER — NICOTINE 21 MG/24HR TD PT24
21.0000 mg | MEDICATED_PATCH | Freq: Every day | TRANSDERMAL | Status: DC
  Administered 2017-07-22 – 2017-08-01 (×11): 21 mg via TRANSDERMAL
  Filled 2017-07-22 (×11): qty 1

## 2017-07-22 MED ORDER — AMLODIPINE BESYLATE 5 MG PO TABS
10.0000 mg | ORAL_TABLET | Freq: Every day | ORAL | Status: DC
  Administered 2017-07-22 – 2017-08-01 (×11): 10 mg via ORAL
  Filled 2017-07-22 (×11): qty 2

## 2017-07-22 MED ORDER — ALUM & MAG HYDROXIDE-SIMETH 200-200-20 MG/5ML PO SUSP
30.0000 mL | ORAL | Status: DC | PRN
  Administered 2017-07-28: 30 mL via ORAL
  Filled 2017-07-22: qty 30

## 2017-07-22 MED ORDER — ASPIRIN EC 81 MG PO TBEC
81.0000 mg | DELAYED_RELEASE_TABLET | Freq: Every day | ORAL | Status: DC
  Administered 2017-07-22 – 2017-08-01 (×11): 81 mg via ORAL
  Filled 2017-07-22 (×11): qty 1

## 2017-07-22 MED ORDER — ARIPIPRAZOLE 10 MG PO TABS
10.0000 mg | ORAL_TABLET | Freq: Every day | ORAL | Status: DC
  Administered 2017-07-22 – 2017-07-24 (×3): 10 mg via ORAL
  Filled 2017-07-22 (×4): qty 1

## 2017-07-22 MED ORDER — DIVALPROEX SODIUM ER 500 MG PO TB24
1000.0000 mg | ORAL_TABLET | Freq: Two times a day (BID) | ORAL | Status: DC
  Administered 2017-07-23 – 2017-08-01 (×19): 1000 mg via ORAL
  Filled 2017-07-22 (×19): qty 2

## 2017-07-22 MED ORDER — DIVALPROEX SODIUM ER 500 MG PO TB24
1500.0000 mg | ORAL_TABLET | Freq: Every day | ORAL | Status: AC
  Administered 2017-07-22: 1500 mg via ORAL
  Filled 2017-07-22: qty 3

## 2017-07-22 MED ORDER — HYDROXYZINE HCL 50 MG PO TABS
50.0000 mg | ORAL_TABLET | Freq: Three times a day (TID) | ORAL | Status: DC | PRN
  Administered 2017-07-23 – 2017-07-31 (×5): 50 mg via ORAL
  Filled 2017-07-22 (×5): qty 1

## 2017-07-22 MED ORDER — MAGNESIUM HYDROXIDE 400 MG/5ML PO SUSP: 30.0000 mL | Freq: Every day | ORAL | Status: DC | PRN

## 2017-07-22 MED ORDER — RISPERIDONE 1 MG PO TABS
2.0000 mg | ORAL_TABLET | Freq: Two times a day (BID) | ORAL | Status: DC
  Administered 2017-07-22 – 2017-07-27 (×10): 2 mg via ORAL
  Filled 2017-07-22 (×10): qty 2

## 2017-07-22 MED ORDER — DIVALPROEX SODIUM ER 500 MG PO TB24: 1500.0000 mg | ORAL_TABLET | Freq: Every day | ORAL | Status: DC

## 2017-07-22 MED ORDER — LISINOPRIL 20 MG PO TABS
40.0000 mg | ORAL_TABLET | Freq: Every day | ORAL | Status: DC
  Administered 2017-07-22 – 2017-08-01 (×11): 40 mg via ORAL
  Filled 2017-07-22 (×11): qty 2

## 2017-07-22 MED ORDER — ACETAMINOPHEN 325 MG PO TABS
650.0000 mg | ORAL_TABLET | Freq: Four times a day (QID) | ORAL | Status: DC | PRN
  Administered 2017-07-22 – 2017-07-27 (×3): 650 mg via ORAL
  Filled 2017-07-22 (×3): qty 2

## 2017-07-22 MED ORDER — METFORMIN HCL 500 MG PO TABS
1000.0000 mg | ORAL_TABLET | Freq: Two times a day (BID) | ORAL | Status: DC
  Administered 2017-07-22 – 2017-08-01 (×21): 1000 mg via ORAL
  Filled 2017-07-22 (×21): qty 2

## 2017-07-22 MED ORDER — TRAZODONE HCL 100 MG PO TABS
100.0000 mg | ORAL_TABLET | Freq: Every day | ORAL | Status: DC
  Administered 2017-07-22 – 2017-07-24 (×3): 100 mg via ORAL
  Filled 2017-07-22 (×3): qty 1

## 2017-07-22 MED ORDER — FLUVOXAMINE MALEATE 50 MG PO TABS
50.0000 mg | ORAL_TABLET | Freq: Every day | ORAL | Status: DC
  Administered 2017-07-22 – 2017-07-31 (×10): 50 mg via ORAL
  Filled 2017-07-22 (×10): qty 1
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
## 2017-07-22 NOTE — Progress Notes (Signed)
Patient is up ad lib with a steady gait, denies SI/HI/AVH. Shows the ability to express his needs to staff. Milieu remains safe with Q15 minute checks. Currently denies SI/HI/AVH but endorses some mild pain (headache) this morning. PRN medication administered with effectiveness. Cooperative and pleasant, participated in afternoon group session. Will continue to monitor and safety maintained.

## 2017-07-22 NOTE — [facility] Suicide Risk Assessment (Signed)
[facility]   Nursing information obtained from:    Demographic factors:    Current Mental Status:    Loss Factors:    Historical Factors:    Risk Reduction Factors:     Total Time spent with patient: 1 hour Principal Problem: Schizoaffective disorder, depressive type (HCC) Diagnosis:   Patient Active Problem List   Diagnosis Date Noted  . Schizoaffective disorder, depressive type (HCC) [F25.1] 07/22/2017    Priority: High  . Asthma [J45.909] 02/14/2017  . HTN (hypertension) [I10] 02/13/2017  . Dyslipidemia [E78.5] 02/13/2017  . Diabetes (HCC) [E11.9] 02/13/2017  . Tobacco use disorder [F17.200] 02/13/2017  . Schizoaffective disorder, bipolar type (HCC) [F25.0] 06/29/2012  . Decreased vision [H54.7] 05/08/2012  . 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  World Science writerHealth Organization [facility](WHO). Score between 0-7:  no or low risk or alcohol related problems. Score between 8-15:  moderate risk of alcohol related problems. Score between 16-19:  high risk of alcohol related problems. Score 20 or above:  warrants further diagnostic evaluation for alcohol dependence and treatment.   CLINICAL FACTORS:   Severe Anxiety and/or Agitation Schizophrenia:   Depressive state Paranoid or undifferentiated type   Musculoskeletal: Strength & Muscle Tone: within normal limits Gait & Station: normal Patient leans: N/A  Psychiatric Specialty Exam: Physical Exam  Nursing note and vitals reviewed. Psychiatric: Judgment normal. His affect is blunt. His speech is delayed. He is slowed and withdrawn. Thought content is paranoid and delusional. Cognition and memory are normal.    Review of Systems  Neurological: Negative.    Psychiatric/Behavioral: Positive for depression. The patient is nervous/anxious and has insomnia.   All other systems reviewed and are negative.   Blood pressure (!) 145/99, pulse 99, temperature 98.1 F (36.7 C), resp. rate 17, height 5\' 10"  (1.778 m), weight 99.8 kg (220 lb), SpO2 99 %.Body mass index is 31.57 kg/m.  General Appearance: Fairly Groomed  Eye Contact:  Good  Speech:  Clear and Coherent  Volume:  Normal  Mood:  Anxious, Depressed and Hopeless  Affect:  Blunt  Thought Process:  Goal Directed and Descriptions of Associations: Intact  Orientation:  Full (Time, Place, and Person)  Thought Content:  Delusions, Hallucinations: Auditory and Paranoid Ideation  Suicidal Thoughts:  No  Homicidal Thoughts:  No  Memory:  Immediate;   Fair Recent;   Fair Remote;   Fair  Judgement:  Impaired  Insight:  Shallow  Psychomotor Activity:  Psychomotor Retardation  Concentration:  Concentration: Fair and Attention Span: Fair  Recall:  FiservFair  Fund of Knowledge:  Fair  Language:  Fair  Akathisia:  No  Handed:  Right  AIMS (if indicated):     Assets:  Communication Skills Desire for Improvement Financial Resources/Insurance Housing Physical Health Resilience Social Support  ADL's:  Intact  Cognition:  WNL  Sleep:         COGNITIVE FEATURES THAT CONTRIBUTE TO RISK:  None    SUICIDE RISK:   Mild:  Suicidal ideation of limited frequency, intensity, duration, and specificity.  There are no identifiable plans, no associated intent, mild dysphoria and related symptoms, good self-control (both objective and subjective assessment), few other risk factors, and identifiable protective factors, including available and accessible social support.  PLAN OF CARE: hospital admission, medication  management, discharge planning.  Mr. Henry Shaffer is a 41 year old male with a history of schizophrenia admitted for psychotic break in spite of reported treatment compliance.  #Mood and  psychosis -continue Abilify 10 mg -increase Depakote ER to 1000 mg BID -start Risperdal 2 mg BID -consider Invega sustenna injection  #Anxiety -start Luvox 50 mg nightly -Hydroxyzine is available as needed  #Insomnia -Trazodone 100 mg nightly  #DM -continue Metformin 1000 mg BID  #HTN -Norvasc 10 mg daily -ASA 81 mg daily  #Dyslipidemia -Lipitor 80 mg daily  #Smoking cessation -nicotine patch is available  #Metabolic syndrome monitoring -Lipid panel, TSH and HgbA1C are pending -EKG pending  #Disposition -discharge to home with the mother -follow up with regular psychiatrist  I certify that inpatient services furnished can reasonably be expected to improve the patient's condition.   Kristine LineaJolanta Lidiya Reise, MD 07/22/2017, 3:38 PM

## 2017-07-22 NOTE — [facility] Group Notes (Signed)
[facility] Group Notes:  (Nursing/MHT/Case Management/Adjunct)  Date:  07/22/2017  Time:  10:00 PM  Type of Therapy:  Group Therapy  Participation Level:  Active  Participation Quality:  Appropriate  Affect:  Appropriate  Cognitive:  Alert  Insight:  Good  Engagement in Group:  Engaged  Modes of Intervention:  Support  Summary of Progress/Problems:  Mayra NeerJackie L Emillio Ngo 07/22/2017, 10:00 PM

## 2017-07-22 NOTE — Tx Team (Signed)
Initial Treatment Plan 07/22/2017 2:12 AM Massie KluverElliot R Germond ZOX:[id number]    PATIENT STRESSORS: Health problems Medication change or noncompliance Other: "my family lineage"   PATIENT STRENGTHS: Ability for insight Communication skills Motivation for treatment/growth Supportive family/friends   PATIENT IDENTIFIED PROBLEMS: Depression 07-22-17  Anxiety 07-22-17  "not taking my medications correctly" 07-22-17                 DISCHARGE CRITERIA:  Ability to meet basic life and health needs Adequate post-discharge living arrangements Improved stabilization in mood, thinking, and/or behavior Need for constant or close observation no longer present Reduction of life-threatening or endangering symptoms to within safe limits Safe-care adequate arrangements made  PRELIMINARY DISCHARGE PLAN:  Return to previous living arrangement Return to previous work or school arrangements  PATIENT/FAMILY INVOLVEMENT: This treatment plan has been presented to and reviewed with the patient, Massie Kluverlliot R Reagle.  The patient has been given the opportunity to ask questions and make suggestions.  Lenox Pondserry J Uziah Sorter, RN 07/22/2017, 2:12 AM

## 2017-07-22 NOTE — Plan of Care (Signed)
Patient is up ad lib with a steady gait, denies SI/HI/AVH. Shows the ability to express his needs to staff. Milieu remains safe with Q15 minute checks. Currently denies SI/HI/AVH but endorses some mild pain (headache) this morning. PRN medication administered with effectiveness. Cooperative and pleasant, participated in afternoon group session. Will continue to monitor and safety maintained.   

## 2017-07-22 NOTE — [facility] Group Notes (Signed)
LCSW Group Therapy Note  07/22/2017 1:15pm  Type of Therapy and Topic: Group Therapy: Holding on to Grudges   Participation Level: Active   Description of Group:  In this group patients will be asked to explore and define a grudge. Patients will be guided to discuss their thoughts, feelings, and reasons as to why people have grudges. Patients will process the impact grudges have on daily life and identify thoughts and feelings related to holding grudges. Facilitator will challenge patients to identify ways to let go of grudges and the benefits this provides. Patients will be confronted to address why one struggles letting go of grudges. Lastly, patients will identify feelings and thoughts related to what life would look like without grudges. This group will be process-oriented, with patients participating in exploration of their own experiences, giving and receiving support, and processing challenge from other group members.  Therapeutic Goals:  1. Patient will identify specific grudges related to their personal life.  2. Patient will identify feelings, thoughts, and beliefs around grudges.  3. Patient will identify how one releases grudges appropriately.  4. Patient will identify situations where they could have let go of the grudge, but instead chose to hold on.   Summary of Patient Progress: Pt identified specific grudges related to his personal life. He reported he has a negative relationship with his brother and he does not know how to set boundaries with him. Pt shared he resides too close to his brother. He indicates he is determine to stay away from him and live a healthier live. He was able to identify way to release grudges appropriately. Pt. Actively engaged in group discussion.     Therapeutic Modalities:  Cognitive Behavioral Therapy  Solution Focused Therapy  Motivational Interviewing  Brief Therapy   Kennley Schwandt  CUEBAS-COLON, LCSW 07/22/2017 10:21 AM

## 2017-07-22 NOTE — Progress Notes (Signed)
D: [facility] Patient skin assessment completed with Trinna PostAlex RN, skin is intact, no contraband found and all prohibited items locked and secured away for discharge. Pt. Was admitted under the services of Dr. Johnella MoloneyMcNew. Pt given a meal.    A: Patient oriented to unit/room/call light.Patient was encourage to participate in unit activities and continue with plan of care. Q x 15 minute observation checks were completed for safety.   R: Patient is receptive to treatment and safety maintained on unit.

## 2017-07-22 NOTE — Progress Notes (Signed)
D:Pt denies SI/HI/AVH. Pt is pleasant and cooperative with the admissions process this evening, but during assessment at times presents bizzare and is preoccupied with what the patient describes as, "I was drawing out my family lineage on the walls to map everything out". When asked why he was drawing all over the walls in his home he was unable to identify a reason and would stare with a flat facial expression.   A: Q x 15 minute observation checks were completed for safety. Patient was provided with education. Patient  was encourage to attend groups, participate in unit activities and continue with plan of care. Pt unable to comply with UDS requested by this Clinical research associatewriter. Will continue to encourage compliance with treatment tests and procedures.   R:Pt verbally contracts for safety and states he can remain safe while on the unit. Will continue to monitor.              Patient slept for Estimated Hours of 2.15; Precautionary checks every 15 minutes for safety maintained, room free of safety hazards, patient sustains no injury or falls during this shift.

## 2017-07-22 NOTE — Plan of Care (Signed)
Pt verbally contracts for safety. Pt states he can remain safe while on the unit. Pt verbalizes understanding of provided education. Pt denies SI/HI this evening.

## 2017-07-23 MED ORDER — ALBUTEROL SULFATE HFA 108 (90 BASE) MCG/ACT IN AERS
2.0000 | INHALATION_SPRAY | Freq: Four times a day (QID) | RESPIRATORY_TRACT | Status: DC
  Administered 2017-07-23 – 2017-08-01 (×29): 2 via RESPIRATORY_TRACT
  Filled 2017-07-23: qty 6.7

## 2017-07-23 NOTE — Plan of Care (Signed)
Patient is pleasant and cooperative.  Denies SI/HI/AVH.  Verbalizes that he feels much better. Verbalizes feelings without difficulty.  Poor eye contact.  Childlike at times with interaction.  Visible in the milieu. Minimal interaction noted with peers.  Good appetite.  Medication compliant.  Support offered.  Safety rounds maintained.

## 2017-07-23 NOTE — Progress Notes (Signed)
Patient is cooperative with treatment, he denies SI/HI/AVH.  Visible in Milieu with no behavioral issues to report on shift thus far. He reported to Clinical research associatewriter that he thought his tooth was chipped or fell out but upon assessment it appeared unremarkable, instructed patient to come to nurse if he has any further discomfort.  He currently denies SI/HI/AVH, will continue to monitor and safety maintained. He appears to be in bed resting quietly at this time.
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
## 2017-07-23 NOTE — Progress Notes (Signed)
[facility] MD Progress Note  07/23/2017 10:31 AM Henry Shaffer  MRN:  [id number]  Subjective:    Henry Shaffer reports improvement this morning and credits increased dose of Depakote for it. We also started Risperdal as the patient remembered it fondly. He is no longer anxious or intrussive. Sleep and appetite are good. No somatic complaints.   Treatment plan. We will continue Abilift 10 mg. We increased Depakote from 1500 to 1000 mg BID and restarted Risperdal 2 mg BID.  Disposition. He will return home with his mother, follow up with his primary psychiatrist.  Principal Problem: Schizoaffective disorder, depressive type (HCC) Diagnosis:   Patient Active Problem List   Diagnosis Date Noted  . Schizoaffective disorder, depressive type (HCC) [F25.1] 07/22/2017    Priority: High  . Asthma [J45.909] 02/14/2017  . HTN (hypertension) [I10] 02/13/2017  . Dyslipidemia [E78.5] 02/13/2017  . Diabetes (HCC) [E11.9] 02/13/2017  . Tobacco use disorder [F17.200] 02/13/2017  . Schizoaffective disorder, bipolar type (HCC) [F25.0] 06/29/2012  . Decreased vision [H54.7] 05/08/2012  . 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 Hypertension     Past Surgical History:  Procedure Laterality Date  . CARDIAC CATHETERIZATION     Family History: History reviewed. No pertinent family history. Family Psychiatric  History: aunt with schizophrenia Social History:  Social History   Substance and Sexual Activity  Alcohol Use Yes  . Alcohol/week: 0.6 oz  . Types: 1 Cans of beer per week   Comment: occassionally     Social History   Substance and Sexual Activity  Drug Use Yes  . Types: Marijuana   Comment: liquid marijuana 2 weeks ago    Social History   Socioeconomic History  . Marital status: Single    Spouse name: None  . Number  of children: None  . Years of education: None  . Highest education level: None  Social Needs  . Financial resource strain: None  . Food insecurity - worry: None  . Food insecurity - inability: None  . Transportation needs - medical: None  . Transportation needs - non-medical: None  Occupational History  . None  Tobacco Use  . Smoking status: Current Every Day Smoker    Packs/day: 2.00  . Smokeless tobacco: Never Used  Substance and Sexual Activity  . Alcohol use: Yes    Alcohol/week: 0.6 oz    Types: 1 Cans of beer per week    Comment: occassionally  . Drug use: Yes    Types: Marijuana    Comment: liquid marijuana 2 weeks ago  . Sexual activity: No  Other Topics Concern  . None  Social History Narrative  . None   Additional Social History:                         Sleep: Fair  Appetite:  Fair  Current Medications: Current Facility-Administered Medications  Medication Dose Route Frequency Provider Last Rate Last Dose  . acetaminophen (TYLENOL) tablet 650 mg  650 mg Oral Q6H PRN Clapacs, Jackquline Denmark, MD   650 mg at 07/22/17 0839  . albuterol (PROVENTIL HFA;VENTOLIN HFA) 108 (90 Base) MCG/ACT inhaler 2 puff  2 puff Inhalation Q6H Debie Ashline B, MD   2 puff at 07/23/17 0828  . alum & mag hydroxide-simeth (MAALOX/MYLANTA) 200-200-20 MG/5ML suspension 30 mL  30  mL Oral Q4H PRN Clapacs, John T, MD      . amLODipine (NORVASC) tablet 10 mg  10 mg Oral Daily Clapacs, Jackquline Denmark, MD   10 mg at 07/23/17 0827  . ARIPiprazole (ABILIFY) tablet 10 mg  10 mg Oral Daily Clapacs, Jackquline Denmark, MD   10 mg at 07/23/17 0831  . aspirin EC tablet 81 mg  81 mg Oral Daily Clapacs, Jackquline Denmark, MD   81 mg at 07/23/17 0827  . atorvastatin (LIPITOR) tablet 80 mg  80 mg Oral q1800 Clapacs, Jackquline Denmark, MD   80 mg at 07/22/17 1703  . divalproex (DEPAKOTE ER) 24 hr tablet 1,000 mg  1,000 mg Oral BID AC & HS Daionna Crossland B, MD   1,000 mg at 07/23/17 0839  . fluvoxaMINE (LUVOX) tablet 50 mg  50 mg Oral  QHS Tahji  B, MD   50 mg at 07/22/17 2155  . hydrOXYzine (ATARAX/VISTARIL) tablet 50 mg  50 mg Oral TID PRN Clapacs, John T, MD      . lisinopril (PRINIVIL,ZESTRIL) tablet 40 mg  40 mg Oral Daily Clapacs, Jackquline Denmark, MD   40 mg at 07/23/17 0827  . magnesium hydroxide (MILK OF MAGNESIA) suspension 30 mL  30 mL Oral Daily PRN Clapacs, John T, MD      . metFORMIN (GLUCOPHAGE) tablet 1,000 mg  1,000 mg Oral BID WC Clapacs, John T, MD   1,000 mg at 07/23/17 0827  . nicotine (NICODERM CQ - dosed in mg/24 hours) patch 21 mg  21 mg Transdermal Daily Clapacs, Jackquline Denmark, MD   21 mg at 07/23/17 2130  . risperiDONE (RISPERDAL) tablet 2 mg  2 mg Oral BID Guiseppe Flanagan B, MD   2 mg at 07/23/17 0827  . traZODone (DESYREL) tablet 100 mg  100 mg Oral QHS PRN Clapacs, Jackquline Denmark, MD      . traZODone (DESYREL) tablet 100 mg  100 mg Oral QHS Clapacs, Jackquline Denmark, MD   100 mg at 07/22/17 2154    Lab Results:  Results for orders placed or performed during the hospital encounter of 07/22/17 (from the past 48 hour(s))  Hemoglobin A1c     Status: Abnormal   Collection Time: 07/22/17  6:49 AM  Result Value Ref Range   Hgb A1c MFr Bld 7.5 (H) 4.8 - 5.6 %    Comment: (NOTE) Pre diabetes:          5.7%-6.4% Diabetes:              >6.4% Glycemic control for   <7.0% adults with diabetes    Mean Plasma Glucose 168.55 mg/dL    Comment: Performed at [facility], [address], [place], [place] [address]  Lipid panel     Status: Abnormal   Collection Time: 07/22/17  6:49 AM  Result Value Ref Range   Cholesterol 68 0 - 200 mg/dL   Triglycerides 88 <469 mg/dL   HDL 27 (L) >62 mg/dL   Total CHOL/HDL Ratio 2.5 RATIO   VLDL 18 0 - 40 mg/dL   LDL Cholesterol 23 0 - 99 mg/dL    Comment:        Total Cholesterol/HDL:CHD Risk Coronary Heart Disease Risk Table                     Men   Women  1/2 Average Risk   3.4   3.3  Average Risk       5.0  4.4  2 X Average Risk   9.6   7.1  3 X Average Risk  23.4    11.0        Use the calculated Patient Ratio above and the CHD Risk Table to determine the patient's CHD Risk.        ATP III CLASSIFICATION (LDL):  <100     mg/dL   Optimal  161-096  mg/dL   Near or Above                    Optimal  130-159  mg/dL   Borderline  045-409  mg/dL   High  >811     mg/dL   Very High Performed at [facility], [address] [place], [place] [address]   TSH     Status: None   Collection Time: 07/22/17  6:49 AM  Result Value Ref Range   TSH 0.929 0.350 - 4.500 uIU/mL    Comment: Performed by a 3rd Generation assay with a functional sensitivity of <=0.01 uIU/mL. Performed at [facility], [address] [place], [place] [address]   Valproic acid level     Status: None   Collection Time: 07/22/17  6:51 AM  Result Value Ref Range   Valproic Acid Lvl 78 50.0 - 100.0 ug/mL    Comment: Performed at [facility], [address] [place], [place] [address]  Urine Drug Screen, Qualitative     Status: None   Collection Time: 07/22/17  9:32 PM  Result Value Ref Range   Tricyclic, Ur Screen NONE DETECTED NONE DETECTED   Amphetamines, Ur Screen NONE DETECTED NONE DETECTED   MDMA (Ecstasy)Ur Screen NONE DETECTED NONE DETECTED   Cocaine Metabolite,Ur [place] NONE DETECTED NONE DETECTED   Opiate, Ur Screen NONE DETECTED NONE DETECTED   Phencyclidine (PCP) Ur S NONE DETECTED NONE DETECTED   Cannabinoid 50 Ng, Ur [place] NONE DETECTED NONE DETECTED   Barbiturates, Ur Screen NONE DETECTED NONE DETECTED   Benzodiazepine, Ur Scrn NONE DETECTED NONE DETECTED   Methadone Scn, Ur NONE DETECTED NONE DETECTED    Comment: (NOTE) Tricyclics + metabolites, urine    Cutoff 1000 ng/mL Amphetamines + metabolites, urine  Cutoff 1000 ng/mL MDMA (Ecstasy), urine              Cutoff 500 ng/mL Cocaine Metabolite, urine          Cutoff 300 ng/mL Opiate + metabolites, urine        Cutoff 300 ng/mL Phencyclidine (PCP), urine         Cutoff 25 ng/mL Cannabinoid,  urine                 Cutoff 50 ng/mL Barbiturates + metabolites, urine  Cutoff 200 ng/mL Benzodiazepine, urine              Cutoff 200 ng/mL Methadone, urine                   Cutoff 300 ng/mL The urine drug screen provides only a preliminary, unconfirmed analytical test result and should not be used for non-medical purposes. Clinical consideration and professional judgment should be applied to any positive drug screen result due to possible interfering substances. A more specific alternate chemical method must be used in order to obtain a confirmed analytical result. Gas chromatography / mass spectrometry (GC/MS) is the preferred confirmat ory method. Performed at [facility], [address], [place], [place] [address]  Blood Alcohol level:  Lab Results  Component Value Date   ETH <10 07/21/2017   ETH <5 02/11/2017    Metabolic Disorder Labs: Lab Results  Component Value Date   HGBA1C 7.5 (H) 07/22/2017   MPG 168.55 07/22/2017   MPG 209 02/15/2017   No results found for: PROLACTIN Lab Results  Component Value Date   CHOL 68 07/22/2017   TRIG 88 07/22/2017   HDL 27 (L) 07/22/2017   CHOLHDL 2.5 07/22/2017   VLDL 18 07/22/2017   LDLCALC 23 07/22/2017   LDLCALC 26 02/15/2017    Physical Findings: AIMS: Facial and Oral Movements Muscles of Facial Expression: None, normal Lips and Perioral Area: None, normal Jaw: None, normal Tongue: None, normal,Extremity Movements Upper (arms, wrists, hands, fingers): None, normal Lower (legs, knees, ankles, toes): None, normal, Trunk Movements Neck, shoulders, hips: None, normal, Overall Severity Severity of abnormal movements (highest score from questions above): None, normal Incapacitation due to abnormal movements: None, normal Patient's awareness of abnormal movements (rate only patient's report): No Awareness, Dental Status Current problems with teeth and/or dentures?: No Does patient usually wear dentures?:  No  CIWA:  CIWA-Ar Total: 1 COWS:  COWS Total Score: 1  Musculoskeletal: Strength & Muscle Tone: within normal limits Gait & Station: normal Patient leans: N/A  Psychiatric Specialty Exam: Physical Exam  Nursing note and vitals reviewed. Psychiatric: His speech is normal. Judgment and thought content normal. His affect is blunt. He is withdrawn. Cognition and memory are normal.    Review of Systems  Neurological: Negative.   Psychiatric/Behavioral: Positive for depression.  All other systems reviewed and are negative.   Blood pressure (!) 150/82, pulse 70, temperature 98.2 F (36.8 C), temperature source Oral, resp. rate 18, height 5\' 10"  (1.778 m), weight 99.8 kg (220 lb), SpO2 97 %.Body mass index is 31.57 kg/m.  General Appearance: Fairly Groomed  Eye Contact:  Fair  Speech:  Clear and Coherent  Volume:  Normal  Mood:  Depressed  Affect:  Blunt  Thought Process:  Goal Directed and Descriptions of Associations: Intact  Orientation:  Full (Time, Place, and Person)  Thought Content:  Paranoid Ideation  Suicidal Thoughts:  No  Homicidal Thoughts:  No  Memory:  Immediate;   Fair Recent;   Fair Remote;   Fair  Judgement:  Fair  Insight:  Shallow  Psychomotor Activity:  Psychomotor Retardation  Concentration:  Concentration: Fair and Attention Span: Fair  Recall:  [employer]Fair  Fund of Knowledge:  Fair  Language:  Fair  Akathisia:  No  Handed:  Right  AIMS (if indicated):     Assets:  Communication Skills Desire for Improvement Financial Resources/Insurance Housing Physical Health Resilience Social Support  ADL's:  Intact  Cognition:  WNL  Sleep:  Number of Hours: 7.5     Treatment Plan Summary: Daily contact with patient to assess and evaluate symptoms and progress in treatment and Medication management   Henry Shaffer is a 41 year old male with a history of schizophrenia admitted for psychotic break in spite of reported treatment compliance.  #Mood and  psychosis -continue Abilify 10 mg -increase Depakote ER to 1000 mg BID -start Risperdal 2 mg BID -consider Invega sustenna injection  #Anxiety -start Luvox 50 mg nightly -Hydroxyzine is available as needed  #Insomnia -Trazodone 100 mg nightly  #DM -continue Metformin 1000 mg BID  #HTN -Norvasc 10 mg daily -ASA 81 mg daily  #Dyslipidemia -Lipitor 80 mg daily  #Smoking cessation -nicotine patch is available  #Metabolic syndrome monitoring -  Lipid panel, TSH and HgbA1C are pending -EKG pending  #Disposition -discharge to home with the mother -follow up with regular psychiatrist    Kristine LineaJolanta Shanine Kreiger, MD 07/23/2017, 10:31 AM

## 2017-07-23 NOTE — [facility] Group Notes (Signed)
LCSW Group Therapy Note 07/23/2017 1:15pm Type of Therapy and Topic: Group Therapy: Feelings Around Returning Home & Establishing a Supportive Framework and Supporting Oneself When Supports Not Available Participation Level: Did Not Attend Description of Group:  Patients first processed thoughts and feelings about upcoming discharge. These included fears of upcoming changes, lack of change, new living environments, judgements and expectations from others and overall stigma of mental health issues. The group then discussed the definition of a supportive framework, what that looks and feels like, and how do to discern it from an unhealthy non-supportive network. The group identified different types of supports as well as what to do when your family/friends are less than helpful or unavailable  Therapeutic Goals  1. Patient will identify one healthy supportive network that they can use at discharge. 2. Patient will identify one factor of a supportive framework and how to tell it from an unhealthy network. 3. Patient able to identify one coping skill to use when they do not have positive supports from others. 4. Patient will demonstrate ability to communicate their needs through discussion and/or role plays.  Summary of Patient Progress:     Therapeutic Modalities Cognitive Behavioral Therapy Motivational Interviewing   Antoni Stefan  CUEBAS-COLON, LCSW 07/23/2017 9:28 AM 

## 2017-07-23 NOTE — Progress Notes (Signed)
Patient is cooperative with treatment, medication compliant, he denies SI/HI/AVH reports depression is 1/10.  Visible in Milieu with no behavioral issues to report on shift thus far.  He appears to be in bed resting quietly at this time.

## 2017-07-24 NOTE — Progress Notes (Signed)
Patient slept for Estimated Hours of 7.45; Precautionary checks every 15 minutes for safety maintained, room free of safety hazards, patient sustains no injury or falls during this shift.  

## 2017-07-24 NOTE — [facility] Suicide Risk Assessment (Addendum)
[facility] INPATIENT:  Family/Significant Other Suicide Prevention Education  Suicide Prevention Education:  Contact Attempts: Pt's mother, Zada Girtatricia Mashburn at [phone],  has been identified by the patient as the family member/significant other with whom the patient will be residing, and identified as the person(s) who will aid the patient in the event of a mental health crisis.  With written consent from the patient, two attempts were made to provide suicide prevention education, prior to and/or following the patient's discharge.  We were unsuccessful in providing suicide prevention education.  A suicide education pamphlet was given to the patient to share with family/significant other.  Date and time of first attempt: 07/24/17 at 9:20AM Date and time of second attempt: 07/25/17 at 9:15AM  Heidi DachKelsey Jessejames Steelman, LCSW 07/25/2017, 9:16 AM

## 2017-07-24 NOTE — Progress Notes (Signed)
Recreation Therapy Notes  Date: 12.31 .2018  Time: 3:00pm  Location: Craft room  Behavioral response: Appropriate  Group Type: Craft  Participation level: Active  Communication: Patient was social with peers and staff.  Comments: N/A  Smayan Hackbart LRT/CTRS        Henry Shaffer 07/24/2017 4:03 PM 

## 2017-07-24 NOTE — Progress Notes (Signed)
[facility] MD Progress Note  07/24/2017 10:54 AM Henry Shaffer  MRN:  [id number] Subjective:  History was reviewed with patient. He states, "I had a breakdown and started writing my family history on the wall." He states that he was not sleeping for 3 days at a time and did not eat at all for 3 days straight. He states that he may have missed some doses of his medications. Pt states that he is feeling better today. He is sleeping better and he states that his appetite is improving. He discussed that he "believes in Christianity and [place]." He is concerned that "people think this is weird." He describes that Caro Hight is a form of magic and has believed in this all of his life. He states that he believes that you can "bless people by putting a special ointment on an objet and touching someone with it." He states that he wanted to "bless" his brothers soon to be wife "to wish them well. "When asked how he would do this, he states that he puts cinnamon which has holy power on a doll and touches the doll to their skin. HE denies having any thoughts of hurting others or himself. He is adamant he would never hurt himself or others. He does not recall what psychiatrist he sees. Per chart, possibly was following with Dr. Janeece Riggers.  Principal Problem: Schizoaffective disorder, depressive type [facility]) Diagnosis:   Patient Active Problem List   Diagnosis Date Noted  . Schizoaffective disorder, depressive type (HCC) [F25.1] 07/22/2017  . Asthma [J45.909] 02/14/2017  . HTN (hypertension) [I10] 02/13/2017  . Dyslipidemia [E78.5] 02/13/2017  . Diabetes (HCC) [E11.9] 02/13/2017  . Tobacco use disorder [F17.200] 02/13/2017  . Schizoaffective disorder, bipolar type (HCC) [F25.0] 06/29/2012  . Decreased vision [H54.7] 05/08/2012  . Conductive hearing loss, middle ear [H90.2] 11/02/2011   Total Time spent with patient: 20 minutes  Past Psychiatric History: See H&P  Past Medical History:  Past Medical History:  Diagnosis Date   . Hypertension     Past Surgical History:  Procedure Laterality Date  . CARDIAC CATHETERIZATION     Family History: History reviewed. No pertinent family history. Family Psychiatric  History: See H&P Social History:  Social History   Substance and Sexual Activity  Alcohol Use Yes  . Alcohol/week: 0.6 oz  . Types: 1 Cans of beer per week   Comment: occassionally     Social History   Substance and Sexual Activity  Drug Use Yes  . Types: Marijuana   Comment: liquid marijuana 2 weeks ago    Social History   Socioeconomic History  . Marital status: Single    Spouse name: None  . Number of children: None  . Years of education: None  . Highest education level: None  Social Needs  . Financial resource strain: None  . Food insecurity - worry: None  . Food insecurity - inability: None  . Transportation needs - medical: None  . Transportation needs - non-medical: None  Occupational History  . None  Tobacco Use  . Smoking status: Current Every Day Smoker    Packs/day: 2.00  . Smokeless tobacco: Never Used  Substance and Sexual Activity  . Alcohol use: Yes    Alcohol/week: 0.6 oz    Types: 1 Cans of beer per week    Comment: occassionally  . Drug use: Yes    Types: Marijuana    Comment: liquid marijuana 2 weeks ago  . Sexual activity: No  Other Topics Concern  .  None  Social History Narrative  . None   Additional Social History:                         Sleep: Good  Appetite:  Good  Current Medications: Current Facility-Administered Medications  Medication Dose Route Frequency Provider Last Rate Last Dose  . acetaminophen (TYLENOL) tablet 650 mg  650 mg Oral Q6H PRN Clapacs, Jackquline Denmark, MD   650 mg at 07/22/17 0839  . albuterol (PROVENTIL HFA;VENTOLIN HFA) 108 (90 Base) MCG/ACT inhaler 2 puff  2 puff Inhalation Q6H Pucilowska, Jolanta B, MD   2 puff at 07/24/17 0914  . alum & mag hydroxide-simeth (MAALOX/MYLANTA) 200-200-20 MG/5ML suspension 30 mL  30  mL Oral Q4H PRN Clapacs, John T, MD      . amLODipine (NORVASC) tablet 10 mg  10 mg Oral Daily Clapacs, Jackquline Denmark, MD   10 mg at 07/24/17 0914  . aspirin EC tablet 81 mg  81 mg Oral Daily Clapacs, Jackquline Denmark, MD   81 mg at 07/24/17 0915  . atorvastatin (LIPITOR) tablet 80 mg  80 mg Oral q1800 Clapacs, Jackquline Denmark, MD   80 mg at 07/23/17 1657  . divalproex (DEPAKOTE ER) 24 hr tablet 1,000 mg  1,000 mg Oral BID AC & HS Pucilowska, Jolanta B, MD   1,000 mg at 07/24/17 0915  . fluvoxaMINE (LUVOX) tablet 50 mg  50 mg Oral QHS Pucilowska, Jolanta B, MD   50 mg at 07/23/17 2003  . hydrOXYzine (ATARAX/VISTARIL) tablet 50 mg  50 mg Oral TID PRN Clapacs, Jackquline Denmark, MD   50 mg at 07/23/17 1937  . lisinopril (PRINIVIL,ZESTRIL) tablet 40 mg  40 mg Oral Daily Clapacs, Jackquline Denmark, MD   40 mg at 07/24/17 0914  . magnesium hydroxide (MILK OF MAGNESIA) suspension 30 mL  30 mL Oral Daily PRN Clapacs, John T, MD      . metFORMIN (GLUCOPHAGE) tablet 1,000 mg  1,000 mg Oral BID WC Clapacs, John T, MD   1,000 mg at 07/24/17 0914  . nicotine (NICODERM CQ - dosed in mg/24 hours) patch 21 mg  21 mg Transdermal Daily Clapacs, Jackquline Denmark, MD   21 mg at 07/24/17 0915  . risperiDONE (RISPERDAL) tablet 2 mg  2 mg Oral BID Pucilowska, Jolanta B, MD   2 mg at 07/24/17 0914  . traZODone (DESYREL) tablet 100 mg  100 mg Oral QHS PRN Clapacs, Jackquline Denmark, MD      . traZODone (DESYREL) tablet 100 mg  100 mg Oral QHS Clapacs, Jackquline Denmark, MD   100 mg at 07/23/17 2004    Lab Results:  Results for orders placed or performed during the hospital encounter of 07/22/17 (from the past 48 hour(s))  Urine Drug Screen, Qualitative     Status: None   Collection Time: 07/22/17  9:32 PM  Result Value Ref Range   Tricyclic, Ur Screen NONE DETECTED NONE DETECTED   Amphetamines, Ur Screen NONE DETECTED NONE DETECTED   MDMA (Ecstasy)Ur Screen NONE DETECTED NONE DETECTED   Cocaine Metabolite,Ur [place] NONE DETECTED NONE DETECTED   Opiate, Ur Screen NONE DETECTED NONE DETECTED    Phencyclidine (PCP) Ur S NONE DETECTED NONE DETECTED   Cannabinoid 50 Ng, Ur [place] NONE DETECTED NONE DETECTED   Barbiturates, Ur Screen NONE DETECTED NONE DETECTED   Benzodiazepine, Ur Scrn NONE DETECTED NONE DETECTED   Methadone Scn, Ur NONE DETECTED NONE DETECTED    Comment: (NOTE) Tricyclics + metabolites,  urine    Cutoff 1000 ng/mL Amphetamines + metabolites, urine  Cutoff 1000 ng/mL MDMA (Ecstasy), urine              Cutoff 500 ng/mL Cocaine Metabolite, urine          Cutoff 300 ng/mL Opiate + metabolites, urine        Cutoff 300 ng/mL Phencyclidine (PCP), urine         Cutoff 25 ng/mL Cannabinoid, urine                 Cutoff 50 ng/mL Barbiturates + metabolites, urine  Cutoff 200 ng/mL Benzodiazepine, urine              Cutoff 200 ng/mL Methadone, urine                   Cutoff 300 ng/mL The urine drug screen provides only a preliminary, unconfirmed analytical test result and should not be used for non-medical purposes. Clinical consideration and professional judgment should be applied to any positive drug screen result due to possible interfering substances. A more specific alternate chemical method must be used in order to obtain a confirmed analytical result. Gas chromatography / mass spectrometry (GC/MS) is the preferred confirmat ory method. Performed at [facility], [address] [place], [place] [address]     Blood Alcohol level:  Lab Results  Component Value Date   [facility] 07/21/2017   ETH <5 02/11/2017    Metabolic Disorder Labs: Lab Results  Component Value Date   HGBA1C 7.5 (H) 07/22/2017   MPG 168.55 07/22/2017   MPG 209 02/15/2017   No results found for: PROLACTIN Lab Results  Component Value Date   CHOL 68 07/22/2017   TRIG 88 07/22/2017   HDL 27 (L) 07/22/2017   CHOLHDL 2.5 07/22/2017   VLDL 18 07/22/2017   LDLCALC 23 07/22/2017   LDLCALC 26 02/15/2017    Physical Findings: AIMS: Facial and Oral Movements Muscles of Facial  Expression: None, normal Lips and Perioral Area: None, normal Jaw: None, normal Tongue: None, normal,Extremity Movements Upper (arms, wrists, hands, fingers): None, normal Lower (legs, knees, ankles, toes): None, normal, Trunk Movements Neck, shoulders, hips: None, normal, Overall Severity Severity of abnormal movements (highest score from questions above): None, normal Incapacitation due to abnormal movements: None, normal Patient's awareness of abnormal movements (rate only patient's report): No Awareness, Dental Status Current problems with teeth and/or dentures?: No Does patient usually wear dentures?: No  CIWA:  CIWA-Ar Total: 1 COWS:  COWS Total Score: 1  Musculoskeletal: Strength & Muscle Tone: within normal limits Gait & Station: normal Patient leans: N/A  Psychiatric Specialty Exam: Physical Exam  Nursing note and vitals reviewed.   Review of Systems  All other systems reviewed and are negative.   Blood pressure (!) 161/97, pulse 93, temperature 97.9 F (36.6 C), temperature source Oral, resp. rate 18, height 5\' 10"  (1.778 m), weight 99.8 kg (220 lb), SpO2 97 %.Body mass index is 31.57 kg/m.  General Appearance: Casual  Eye Contact:  Good  Speech:  Clear and Coherent  Volume:  Normal  Mood:  Euthymic  Affect:  Congruent  Thought Process:  Coherent and Goal Directed  Orientation:  Full (Time, Place, and Person)  Thought Content:  Negative  Suicidal Thoughts:  No  Homicidal Thoughts:  No  Memory:  Immediate;   Fair  Judgement:  Fair  Insight:  Fair  Psychomotor Activity:  Normal  Concentration:  Concentration: Fair  Recall:  Fair  Progress EnergyFund of Knowledge:  Fair  Language:  Fair  Akathisia:  No      Assets:  Communication Skills Desire for Improvement Housing  ADL's:  Intact  Cognition:  WNL  Sleep:  Number of Hours: 7.5     Treatment Plan Summary: 41 yo male admitted after bizarre behaviors at home. Pt is sleeping much better and much more organized in  thought process. He is tolerating Risperdal well and is wanting to get on LAI. He is very interested in starting [place]ga Sustenna. We will stop Abilify to avoid polypharmacy as he will likely respond well to single anti-psychotic.   Plan:  Schizoaffective disorder -Continue Risperdal 2 mg BID. Will give initial dose of [place]vega Sustenna tomorrow -Stop Abilify to avoid polypharmacy -Continue Depakote 1000 mg BID. Will recheck level on Friday since dose was increased  HTN -Norvasc 10 mg daily -Lisinopril 40 mg daily  HLD -Lipitor 80 mg daily  Insomnia -Trazodone 100 mg qhs  Dispo -Pt will likely return to his mothers house on discharge  Haskell RilingHolly R McNew, MD 07/24/2017, 10:54 AM

## 2017-07-24 NOTE — Plan of Care (Signed)
Pt verbalizes understanding of provided education. Pt states he can remain safe while on the unit. Pt denies SI/HI. Pt verbally contracts for safety.

## 2017-07-24 NOTE — Plan of Care (Signed)
Pleasant and cooperative.  Verbalizes that he feels much better. Denies any AVH.  No signs of responding to any type of stimuli noted.  Verbalized goal is to feel better and get his medications straight. Smiles with engagement. No initiation of conversations.  Visible in the milieu.  Medication and group compliant.  Support and encouragement offered.  Safety maintained.

## 2017-07-24 NOTE — Progress Notes (Signed)
Patient in for evening medications.  Affect brighter. Smiling and states that he feels better.  Further states "I just want to be happy again"

## 2017-07-24 NOTE — [facility] Group Notes (Signed)
[facility] Group Notes:  (Nursing/MHT/Case Management/Adjunct)  Date:  07/24/2017  Time:  10:10 PM  Type of Therapy:  Group Therapy  Participation Level:  Active  Participation Quality:  Appropriate  Affect:  Appropriate  Cognitive:  Appropriate  Insight:  Appropriate  Engagement in Group:  Engaged  Modes of Intervention:  Discussion  Summary of Progress/Problems:  Henry EkJanice Marie [name] 07/24/2017, 10:10 PM

## 2017-07-24 NOTE — Progress Notes (Signed)
Recreation Therapy Notes  Date: 12.31.2018   Time: 9:30 am   Location: Craft Room   Behavioral response: Appropriate   Intervention Topic: Coping skills   Discussion/Intervention: Group content on today was focused on coping skills. The group defined what coping skills are and when they can be used. Individuals described how they normally cope with thing and the coping skills they normally use. Patients expressed why it is important to cope with things and how not coping with things can affect you. The group participated in the intervention "My coping box" and made coping boxes while adding coping skills they could use in the future to the box. Clinical Observations/Feedback:  Patient came to group and defined coping as relieving stress. He identified walking and being in nature as positive ways to cope with things. Individual participated in the intervention and was positive and social with peers during group.   Rollan Roger LRT/CTRS         Burech Mcfarland 07/24/2017 12:54 PM

## 2017-07-24 NOTE — [facility] Group Notes (Signed)
07/24/2017  Time: 1:00PM   Type of Therapy and Topic:  Group Therapy:  Overcoming Obstacles   Participation Level:  Did Not Attend   Description of Group:   In this group patients will be encouraged to explore what they see as obstacles to their own wellness and recovery. They will be guided to discuss their thoughts, feelings, and behaviors related to these obstacles. The group will process together ways to cope with barriers, with attention given to specific choices patients can make. Each patient will be challenged to identify changes they are motivated to make in order to overcome their obstacles. This group will be process-oriented, with patients participating in exploration of their own experiences, giving and receiving support, and processing challenge from other group members.   Therapeutic Goals: 1. Patient will identify personal and current obstacles as they relate to admission. 2. Patient will identify barriers that currently interfere with their wellness or overcoming obstacles.  3. Patient will identify feelings, thought process and behaviors related to these barriers. 4. Patient will identify two changes they are willing to make to overcome these obstacles:      Summary of Patient Progress  Pt was invited to attend group but chose not to attend. CSW will continue to encourage pt to attend group throughout their admission.     Therapeutic Modalities:   Cognitive Behavioral Therapy Solution Focused Therapy Motivational Interviewing Relapse Prevention Therapy  Heidi DachKelsey Khadim Lundberg, MSW, LCSW 07/24/2017 2:16 PM

## 2017-07-24 NOTE — Plan of Care (Signed)
Patient is oriented to unit. Patient's safety is maintained on unit.    Progressing Education: Knowledge of [place] [facility] Education information/materials will improve 07/24/2017 2004 - Progressing by Addison Naegelieynolds, Malachi Kinzler I, RN Safety: Periods of time without injury will increase 07/24/2017 2004 - Progressing by Berkley Harveyeynolds, Shontel Santee I, RN

## 2017-07-24 NOTE — Tx Team (Signed)
Interdisciplinary Treatment and Diagnostic Plan Update  07/24/2017 Time of Session: [name] MRN: [id number]  Principal Diagnosis: Schizoaffective disorder, depressive type (HCC)  Secondary Diagnoses: Principal Problem:   Schizoaffective disorder, depressive type (HCC) Active Problems:   HTN (hypertension)   Dyslipidemia   Diabetes (HCC)   Tobacco use disorder   Current Medications:  Current Facility-Administered Medications  Medication Dose Route Frequency Provider Last Rate Last Dose  . acetaminophen (TYLENOL) tablet 650 mg  650 mg Oral Q6H PRN Clapacs, Jackquline DenmarkJohn T, MD   650 mg at 07/22/17 0839  . albuterol (PROVENTIL HFA;VENTOLIN HFA) 108 (90 Base) MCG/ACT inhaler 2 puff  2 puff Inhalation Q6H Pucilowska, Jolanta B, MD   2 puff at 07/24/17 0914  . alum & mag hydroxide-simeth (MAALOX/MYLANTA) 200-200-20 MG/5ML suspension 30 mL  30 mL Oral Q4H PRN Clapacs, John T, MD      . amLODipine (NORVASC) tablet 10 mg  10 mg Oral Daily Clapacs, Jackquline DenmarkJohn T, MD   10 mg at 07/24/17 0914  . aspirin EC tablet 81 mg  81 mg Oral Daily Clapacs, Jackquline DenmarkJohn T, MD   81 mg at 07/24/17 0915  . atorvastatin (LIPITOR) tablet 80 mg  80 mg Oral q1800 Clapacs, Jackquline DenmarkJohn T, MD   80 mg at 07/23/17 1657  . divalproex (DEPAKOTE ER) 24 hr tablet 1,000 mg  1,000 mg Oral BID AC & HS Pucilowska, Jolanta B, MD   1,000 mg at 07/24/17 0915  . fluvoxaMINE (LUVOX) tablet 50 mg  50 mg Oral QHS Pucilowska, Jolanta B, MD   50 mg at 07/23/17 2003  . hydrOXYzine (ATARAX/VISTARIL) tablet 50 mg  50 mg Oral TID PRN Clapacs, Jackquline DenmarkJohn T, MD   50 mg at 07/23/17 1937  . lisinopril (PRINIVIL,ZESTRIL) tablet 40 mg  40 mg Oral Daily Clapacs, Jackquline DenmarkJohn T, MD   40 mg at 07/24/17 0914  . magnesium hydroxide (MILK OF MAGNESIA) suspension 30 mL  30 mL Oral Daily PRN Clapacs, John T, MD      . metFORMIN (GLUCOPHAGE) tablet 1,000 mg  1,000 mg Oral BID WC Clapacs, John T, MD   1,000 mg at 07/24/17 0914  . nicotine (NICODERM CQ - dosed in mg/24 hours) patch 21 mg   21 mg Transdermal Daily Clapacs, Jackquline DenmarkJohn T, MD   21 mg at 07/24/17 0915  . risperiDONE (RISPERDAL) tablet 2 mg  2 mg Oral BID Pucilowska, Jolanta B, MD   2 mg at 07/24/17 0914  . traZODone (DESYREL) tablet 100 mg  100 mg Oral QHS PRN Clapacs, John T, MD      . traZODone (DESYREL) tablet 100 mg  100 mg Oral QHS Clapacs, John T, MD   100 mg at 07/23/17 2004   PTA Medications: Medications Prior to Admission  Medication Sig Dispense Refill Last Dose  . albuterol (PROVENTIL HFA;VENTOLIN HFA) 108 (90 Base) MCG/ACT inhaler Inhale 2 puffs into the lungs every 6 (six) hours as needed for wheezing or shortness of breath.    PRN at PRN  . amLODipine (NORVASC) 10 MG tablet Take 1 tablet (10 mg total) by mouth daily. 30 tablet 0   . ARIPiprazole (ABILIFY) 10 MG tablet Take 1 tablet (10 mg total) by mouth daily. 30 tablet 0   . aspirin 81 MG chewable tablet Chew 81 mg by mouth daily.    unknown at unknown  . atorvastatin (LIPITOR) 80 MG tablet Take 1 tablet (80 mg total) by mouth daily. 30 tablet 0   . divalproex (DEPAKOTE ER) 500  MG 24 hr tablet Take 3 tablets (1,500 mg total) by mouth daily. 90 tablet 0   . lisinopril (PRINIVIL,ZESTRIL) 20 MG tablet Take 1 tablet (20 mg total) by mouth daily. 30 tablet 0   . metFORMIN (GLUCOPHAGE) 1000 MG tablet Take 1 tablet (1,000 mg total) by mouth 2 (two) times daily. 60 tablet 0   . traZODone (DESYREL) 100 MG tablet Take 1 tablet (100 mg total) by mouth at bedtime. 30 tablet 0     Patient Stressors: Health problems Medication change or noncompliance Other: "my family lineage"  Patient Strengths: Ability for insight Communication skills Motivation for treatment/growth Supportive family/friends  Treatment Modalities: Medication Management, Group therapy, Case management,  1 to 1 session with clinician, Psychoeducation, Recreational therapy.   Physician Treatment Plan for Primary Diagnosis: Schizoaffective disorder, depressive type (HCC) Long Term Goal(s):  Improvement in symptoms so as ready for discharge NA   Short Term Goals: Ability to identify changes in lifestyle to reduce recurrence of condition will improve Ability to verbalize feelings will improve Ability to disclose and discuss suicidal ideas Ability to demonstrate self-control will improve Ability to identify and develop effective coping behaviors will improve Ability to maintain clinical measurements within normal limits will improve Ability to identify triggers associated with substance abuse/mental health issues will improve NA  Medication Management: Evaluate patient's response, side effects, and tolerance of medication regimen.  Therapeutic Interventions: 1 to 1 sessions, Unit Group sessions and Medication administration.  Evaluation of Outcomes: Progressing  Physician Treatment Plan for Secondary Diagnosis: Principal Problem:   Schizoaffective disorder, depressive type (HCC) Active Problems:   HTN (hypertension)   Dyslipidemia   Diabetes (HCC)   Tobacco use disorder  Long Term Goal(s): Improvement in symptoms so as ready for discharge NA   Short Term Goals: Ability to identify changes in lifestyle to reduce recurrence of condition will improve Ability to verbalize feelings will improve Ability to disclose and discuss suicidal ideas Ability to demonstrate self-control will improve Ability to identify and develop effective coping behaviors will improve Ability to maintain clinical measurements within normal limits will improve Ability to identify triggers associated with substance abuse/mental health issues will improve NA     Medication Management: Evaluate patient's response, side effects, and tolerance of medication regimen.  Therapeutic Interventions: 1 to 1 sessions, Unit Group sessions and Medication administration.  Evaluation of Outcomes: Progressing   RN Treatment Plan for Primary Diagnosis: Schizoaffective disorder, depressive type (HCC) Long Term  Goal(s): Knowledge of disease and therapeutic regimen to maintain health will improve  Short Term Goals: Ability to verbalize feelings will improve, Ability to identify and develop effective coping behaviors will improve and Compliance with prescribed medications will improve  Medication Management: RN will administer medications as ordered by provider, will assess and evaluate patient's response and provide education to patient for prescribed medication. RN will report any adverse and/or side effects to prescribing provider.  Therapeutic Interventions: 1 on 1 counseling sessions, Psychoeducation, Medication administration, Evaluate responses to treatment, Monitor vital signs and CBGs as ordered, Perform/monitor CIWA, COWS, AIMS and Fall Risk screenings as ordered, Perform wound care treatments as ordered.  Evaluation of Outcomes: Progressing   LCSW Treatment Plan for Primary Diagnosis: Schizoaffective disorder, depressive type (HCC) Long Term Goal(s): Safe transition to appropriate next level of care at discharge, Engage patient in therapeutic group addressing interpersonal concerns.  Short Term Goals: Engage patient in aftercare planning with referrals and resources, Identify triggers associated with mental health/substance abuse issues and Increase skills for wellness  and recovery  Therapeutic Interventions: Assess for all discharge needs, 1 to 1 time with Child psychotherapist, Explore available resources and support systems, Assess for adequacy in community support network, Educate family and significant other(s) on suicide prevention, Complete Psychosocial Assessment, Interpersonal group therapy.  Evaluation of Outcomes: Progressing   Progress in Treatment: Attending groups: Yes. Participating in groups: Yes. Taking medication as prescribed: Yes. Toleration medication: Yes. Family/Significant other contact made: Yes, individual(s) contacted:  attempted contact with pt's mother.  Patient  understands diagnosis: Yes. Discussing patient identified problems/goals with staff: Yes. Medical problems stabilized or resolved: Yes. Denies suicidal/homicidal ideation: Yes. Issues/concerns per patient self-inventory: No. Other: None at this time.   New problem(s) identified: No, Describe:  None at this time.  New Short Term/Long Term Goal(s): Pt reported his goal for treatment is to, "be as one with everything around me as I can be. That keeps me calm."   Discharge Plan or Barriers: CSW will continue to assess for appropriate discharge plan.   Reason for Continuation of Hospitalization: Depression Hallucinations Medication stabilization  Estimated Length of Stay: 5-7 days   Recreational Therapy: Patient Stressors: Past history Patient Goal: Patient will identify 3 positive coping skills to decrease depressive symptoms x5 days.   Attendees: Patient: Henry Shaffer 07/24/2017 11:28 AM  Physician: Dr. Johnella Moloney, MD 07/24/2017 11:28 AM  Nursing: Leonia Reader, RN 07/24/2017 11:28 AM  RN Care Manager: 07/24/2017 11:28 AM  Social Worker: Heidi Dach, LCSW 07/24/2017 11:28 AM  Recreational Therapist: Garret Reddish, CTRS-LRT 07/24/2017 11:28 AM  Other: Johny Shears, LCSWA 07/24/2017 11:28 AM  Other: Jake Shark, LCSW 07/24/2017 11:28 AM  Other: 07/24/2017 11:28 AM    Scribe for Treatment Team: Heidi Dach, LCSW 07/24/2017 11:28 AM

## 2017-07-24 NOTE — Progress Notes (Signed)
Recreation Therapy Notes  INPATIENT RECREATION THERAPY ASSESSMENT  Patient Details Name: Henry Shaffer MRN: [id number] DOB: 01-27-1976 Today's Date: 07/24/2017  Patient Stressors: (past history)  Coping Skills:   (Knocking on things )  Personal Challenges: Anger, Communication, Decision-Making  Leisure Interests (2+):  Ashby DawesNature - Other (Comment)(walking and listening to nature)  Awareness of Community Resources:  Yes  Community Resources:  Library  Current Use: No  If no, Barriers?: Transportation  Patient Strengths:  Smart, Religious  Patient Identified Areas of Improvement:  Alot of things  Current Recreation Participation:  Walking  Patient Goal for Hospitalization:  To talk more about things  [place]  County of Residence:  [place]   Current SI (including self-harm):  No  Current HI:  No  Consent to Intern Participation: N/A   Marianne Golightly 07/24/2017, 2:03 PM

## 2017-07-25 MED ORDER — TRAZODONE HCL 50 MG PO TABS
50.0000 mg | ORAL_TABLET | Freq: Every evening | ORAL | Status: DC | PRN
  Administered 2017-07-30 – 2017-07-31 (×2): 50 mg via ORAL
  Filled 2017-07-25 (×2): qty 1

## 2017-07-25 MED ORDER — TRAZODONE HCL 50 MG PO TABS: 50.0000 mg | ORAL_TABLET | Freq: Every day | ORAL | Status: DC

## 2017-07-25 NOTE — [facility] Group Notes (Signed)
LCSW Group Therapy Note 07/25/2017 9:00am  Type of Therapy and Topic:  Group Therapy:  Setting Goals  Participation Level:  Did Not Attend  Description of Group: In this process group, patients discussed using strengths to work toward goals and address challenges.  Patients identified two positive things about themselves and one goal they were working on.  Patients were given the opportunity to share openly and support each other's plan for self-empowerment.  The group discussed the value of gratitude and were encouraged to have a daily reflection of positive characteristics or circumstances.  Patients were encouraged to identify a plan to utilize their strengths to work on current challenges and goals.  Therapeutic Goals 1. Patient will verbalize personal strengths/positive qualities and relate how these can assist with achieving desired personal goals 2. Patients will verbalize affirmation of peers plans for personal change and goal setting 3. Patients will explore the value of gratitude and positive focus as related to successful achievement of goals 4. Patients will verbalize a plan for regular reinforcement of personal positive qualities and circumstances.  Summary of Patient Progress:       Therapeutic Modalities Cognitive Behavioral Therapy Motivational Interviewing    Glennon MacSara P Marshal Schrecengost, LCSW 07/25/2017 10:45 AM

## 2017-07-25 NOTE — Progress Notes (Signed)
Recreation Therapy Notes  Date: 01.01.2019   Time: 9:30 am   Location: Craft Room   Behavioral response: N/A   Intervention Topic: Goals   Discussion/Intervention: Patient did not attend group. Clinical Observations/Feedback:  Patient did not attend group. Davyon Fisch LRT/CTRS         Lucero Auzenne 07/25/2017 11:01 AM 

## 2017-07-25 NOTE — Progress Notes (Signed)
CSW met with pt to ask if he had any other family member's phone numbers. Pt reported he had his brother's--Raymond Heikes at [phone]. Pt signed release for his brother.   CSW attempted to contact pt's brother, Mamadou Breon. Pt's brother did not answer, so CSW left a voicemail asking him to call back ASAP. CSW will continue to reattempt collateral contact with pt's brother.   Alden Hipp, MSW, LCSW 07/25/2017 9:29 AM

## 2017-07-25 NOTE — Plan of Care (Addendum)
Patient is alert and oriented X 4. Patient denies SI, HI and AVH. Patient's affect this morning is pleasant. Patient states," I feel really good this morning, I have been making jokes and everything." Patient is cooperative on the unit and appropriate with staff and peers. Patient is compliant with medications. Medication education provided; patient verbalized understanding. Nurse will continue to monitor.Safety checks Q 15 minutes continued. Safety: Periods of time without injury will increase 07/25/2017 1119 - Progressing by Leamon ArntPowell, Ozzy Bohlken K, RN   Education: Will be free of psychotic symptoms 07/25/2017 1119 - Progressing by Leamon ArntPowell, Sherrell Weir K, RN Knowledge of the prescribed therapeutic regimen will improve 07/25/2017 1119 - Progressing by Leamon ArntPowell, Nneka Blanda K, RN   Education: Knowledge of [facility] Education information/materials will improve 07/25/2017 1119 - Progressing by Leamon ArntPowell, Wessley Emert K, RN Emotional status will improve 07/25/2017 1119 - Progressing by Leamon ArntPowell, Cathy Crounse K, RN Mental status will improve 07/25/2017 1119 - Progressing by Leamon ArntPowell, Vasilisa Vore K, RN Verbalization of understanding the information provided will improve 07/25/2017 1119 - Progressing by Leamon ArntPowell, Siobhan Zaro K, RN

## 2017-07-25 NOTE — Progress Notes (Signed)
CSW received a call back from pt's brother, Derald MacleodRaymond Calkin. CSW asked pt's brother for clarification regarding pt's mother's health-as pt currently believes that his mother is in poor health. Pt's brother stated, "She's been a little bit sick. She's been through a lot. My aunt is in the hospital in critical care. I think there might be a mistake with his aunt and his mother. Our mom is okay. She hasn't been able to come visit because she's sick. She's been up a lot with Markham JordanElliot and his episodes, he's a handful when he's not on his medications. We just want him to get well. Our mother has been staying with me while he's in the hospital, but she is doing okay." Pt's brother confirmed that pt is able to return home upon discharge and will not have access to weapons or other means of self harm. CSW will continue to coordinate with pt's brother as needed for updates and discharge planning.   Heidi DachKelsey Tiara Bartoli, MSW, LCSW 07/25/2017 10:34 AM

## 2017-07-25 NOTE — [facility] Group Notes (Signed)
[facility] Group Notes:  (Nursing/MHT/Case Management/Adjunct)  Date:  07/25/2017  Time:  9:20 PM  Type of Therapy: Evening Wrap-up Group  Participation Level:  Active  Participation Quality:  Appropriate and Attentive  Affect:  Appropriate  Cognitive:  Alert and Appropriate  Insight:  Appropriate and Good  Engagement in Group:  Developing/Improving and Engaged  Modes of Intervention:  Discussion, Socialization and Support  Summary of Progress/Problems:  Henry MorrowChelsea Nanta [name] 07/25/2017, 9:20 PM

## 2017-07-25 NOTE — Progress Notes (Signed)
D: Patient denies SI/HI/AVH. Patient engages in conversation, patient is pleasant and cooperative. Is seen in milieu interacting with peers. Patient has no complaints of pain.   A: Patient was assessed by this nurse. Patient received scheduled medications. Q x 15 minute observation checks were completed for safety. Patient was provided with verbal education on provided medications. Patient care plan was reviewed. Patient was offered support and encouragement. Patient was encourage to attend groups, participate in unit activities and continue with plan of care.   R: Patient adheres with scheduled medication.  Patient has no complaints of pain at this time. Patient is receptive to treatment and safety maintained on unit.

## 2017-07-25 NOTE — Progress Notes (Signed)
Patient continues to deny SI, HI and AVH. Patient states," I told my brother I just have an imbalance in my head that's why I was writing on the walls." Patient has been attending groups, eating meals appropriately. Patient affect has been pleasant throughout the day.

## 2017-07-25 NOTE — Progress Notes (Signed)
[facility]  07/25/2017 12:52 PM Massie Kluverlliot R Schuman  MRN:  [id number]   Subjective:  Pt states that he "Feel much better." He states that his mood is better and "I feel happy again." HE denies feeling euphoric. He states that he is sleeping well and states that prior to coming to the hospital he didn't sleep in 3 days. He states that his appetite is much improved, as well. HE states that his mother was sick and he was really worried about her but she is feeling better now. He states that he has been talking to people on the unit and they have taught him to "be yourself." HE states that he feels tired during the day but states, "I guess I just need to get used to the medications. " He denies SI, HI, AH, VH or paranoia. He has been calm and pleasant on the unit. No bizarre behaviors noted. HE is oriented to place, situation, person and date and president but did have to think about it for some time.   Principal Problem: Schizoaffective disorder, depressive type (HCC) Diagnosis:   Patient Active Problem List   Diagnosis Date Noted  . Schizoaffective disorder, depressive type (HCC) [F25.1] 07/22/2017    Priority: High  . Asthma [J45.909] 02/14/2017  . HTN (hypertension) [I10] 02/13/2017  . Dyslipidemia [E78.5] 02/13/2017  . Diabetes (HCC) [E11.9] 02/13/2017  . Tobacco use disorder [F17.200] 02/13/2017  . Schizoaffective disorder, bipolar type (HCC) [F25.0] 06/29/2012  . Decreased vision [H54.7] 05/08/2012  . 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 Hypertension     Past Surgical History:  Procedure Laterality Date  . CARDIAC CATHETERIZATION     Family History: History reviewed. No pertinent family history. Family Psychiatric  History: See H&P Social History:  Social History   Substance and Sexual Activity  Alcohol Use  Yes  . Alcohol/week: 0.6 oz  . Types: 1 Cans of beer per week   Comment: occassionally     Social History   Substance and Sexual Activity  Drug Use Yes  . Types: Marijuana   Comment: liquid marijuana 2 weeks ago    Social History   Socioeconomic History  . Marital status: Single    Spouse name: None  . Number of children: None  . Years of education: None  . Highest education level: None  Social Needs  . Financial resource strain: None  . Food insecurity - worry: None  . Food insecurity - inability: None  . Transportation needs - medical: None  . Transportation needs - non-medical: None  Occupational History  . None  Tobacco Use  . Smoking status: Current Every Day Smoker    Packs/day: 2.00  . Smokeless tobacco: Never Used  Substance and Sexual Activity  . Alcohol use: Yes    Alcohol/week: 0.6 oz    Types: 1 Cans of beer per week    Comment: occassionally  . Drug use: Yes    Types: Marijuana    Comment: liquid marijuana 2 weeks ago  . Sexual activity: No  Other Topics Concern  . None  Social History Narrative  . None   Additional Social History:                         Sleep: Good  Appetite:  Good  Current  Medications: Current Facility-Administered Medications  Medication Dose Route Frequency Provider Last Rate Last Dose  . acetaminophen (TYLENOL) tablet 650 mg  650 mg Oral Q6H PRN Clapacs, Jackquline Denmark, MD   650 mg at 07/22/17 0839  . albuterol (PROVENTIL HFA;VENTOLIN HFA) 108 (90 Base) MCG/ACT inhaler 2 puff  2 puff Inhalation Q6H Pucilowska, Jolanta B, MD   2 puff at 07/25/17 0841  . alum & mag hydroxide-simeth (MAALOX/MYLANTA) 200-200-20 MG/5ML suspension 30 mL  30 mL Oral Q4H PRN Clapacs, John T, MD      . amLODipine (NORVASC) tablet 10 mg  10 mg Oral Daily Clapacs, Jackquline Denmark, MD   10 mg at 07/25/17 0839  . aspirin EC tablet 81 mg  81 mg Oral Daily Clapacs, Jackquline Denmark, MD   81 mg at 07/25/17 0839  . atorvastatin (LIPITOR) tablet 80 mg  80 mg Oral q1800  Clapacs, Jackquline Denmark, MD   80 mg at 07/24/17 1713  . divalproex (DEPAKOTE ER) 24 hr tablet 1,000 mg  1,000 mg Oral BID AC & HS Pucilowska, Jolanta B, MD   1,000 mg at 07/25/17 0843  . fluvoxaMINE (LUVOX) tablet 50 mg  50 mg Oral QHS Pucilowska, Jolanta B, MD   50 mg at 07/24/17 2209  . hydrOXYzine (ATARAX/VISTARIL) tablet 50 mg  50 mg Oral TID PRN Clapacs, Jackquline Denmark, MD   50 mg at 07/23/17 1937  . lisinopril (PRINIVIL,ZESTRIL) tablet 40 mg  40 mg Oral Daily Clapacs, Jackquline Denmark, MD   40 mg at 07/25/17 0839  . magnesium hydroxide (MILK OF MAGNESIA) suspension 30 mL  30 mL Oral Daily PRN Clapacs, John T, MD      . metFORMIN (GLUCOPHAGE) tablet 1,000 mg  1,000 mg Oral BID WC Clapacs, Jackquline Denmark, MD   1,000 mg at 07/25/17 0839  . nicotine (NICODERM CQ - dosed in mg/24 hours) patch 21 mg  21 mg Transdermal Daily Clapacs, Jackquline Denmark, MD   21 mg at 07/25/17 0844  . risperiDONE (RISPERDAL) tablet 2 mg  2 mg Oral BID Pucilowska, Jolanta B, MD   2 mg at 07/25/17 0839  . traZODone (DESYREL) tablet 100 mg  100 mg Oral QHS PRN Clapacs, John T, MD      . traZODone (DESYREL) tablet 100 mg  100 mg Oral QHS Clapacs, John T, MD   100 mg at 07/24/17 2209    Lab Results: No results found for this or any previous visit (from the past 48 hour(s)).  Blood Alcohol level:  Lab Results  Component Value Date   ETH <10 07/21/2017   ETH <5 02/11/2017    Metabolic Disorder Labs: Lab Results  Component Value Date   HGBA1C 7.5 (H) 07/22/2017   MPG 168.55 07/22/2017   MPG 209 02/15/2017   No results found for: PROLACTIN Lab Results  Component Value Date   CHOL 68 07/22/2017   TRIG 88 07/22/2017   HDL 27 (L) 07/22/2017   CHOLHDL 2.5 07/22/2017   VLDL 18 07/22/2017   LDLCALC 23 07/22/2017   LDLCALC 26 02/15/2017    Physical Findings: AIMS: Facial and Oral Movements Muscles of Facial Expression: None, normal Lips and Perioral Area: None, normal Jaw: None, normal Tongue: None, normal,Extremity Movements Upper (arms, wrists,  hands, fingers): None, normal Lower (legs, knees, ankles, toes): None, normal, Trunk Movements Neck, shoulders, hips: None, normal, Overall Severity Severity of abnormal movements (highest score from questions above): None, normal Incapacitation due to abnormal movements: None, normal Patient's awareness of abnormal movements (rate  only patient's report): No Awareness, Dental Status Current problems with teeth and/or dentures?: No Does patient usually wear dentures?: No  CIWA:  CIWA-Ar Total: 1 COWS:  COWS Total Score: 1  Musculoskeletal: Strength & Muscle Tone: within normal limits Gait & Station: normal Patient leans: N/A  Psychiatric Specialty Exam: Physical Exam  Nursing note and vitals reviewed.   Review of Systems  All other systems reviewed and are negative.   Blood pressure (!) 145/97, pulse 84, temperature 97.7 F (36.5 C), temperature source Oral, resp. rate 18, height 5\' 10"  (1.778 m), weight 99.8 kg (220 lb), SpO2 97 %.Body mass index is 31.57 kg/m.  General Appearance: Casual  Eye Contact:  Fair  Speech:  Clear and Coherent  Volume:  Normal  Mood:  Euthymic  Affect:  Appropriate  Thought Process:  Coherent and Goal Directed, concrete  Orientation:  Full (Time, Place, and Person)  Thought Content:  Negative  Suicidal Thoughts:  No  Homicidal Thoughts:  No  Memory:  Immediate;   Fair  Judgement:  Fair  Insight:  Fair  Psychomotor Activity:  Normal  Concentration:  Concentration: Fair  Recall:  [employer] of Knowledge:  Fair  Language:  Fair  Akathisia:  No      Assets:  Communication Skills Desire for Improvement Housing Resilience  ADL's:  Intact  Cognition:  WNL  Sleep:  Number of Hours: 7.25     Treatment Plan Summary: 42 yo male admitted due to acute psychosis and manic episode. He is much improved and sleeping much better. He is tolerating Risperdal well and is open to getting [name] injection.  Plan:  Mood and  psychosis -Continue Risperdal 2 mg BID -Will plan to given initial dose of [name] tomorrow and second dose on Sunday -Abilify was discontinued yesterday -Continue Depakote 1000 mg BID. Will recheck level on Friday  Anxiety -Continue Luvox 50 mg qhs  Insomnia -Will decrease trazodone to 50 mg and make prn instead of scheduled as he is feeling sedated during the day  DM -Continue Metformin 1000 mg BID  HTN -Norvasc 10 mg daily -Lisinopril 40 mg daily  Dispo -pt will return home with his mother when stable.    Haskell Riling, MD 07/25/2017, 12:52 PM

## 2017-07-25 NOTE — [facility] Group Notes (Signed)
07/25/2017 1PM  Type of Therapy/Topic:  Group Therapy:  Feelings about Diagnosis  Participation Level:  Active   Description of Group:   This group will allow patients to explore their thoughts and feelings about diagnoses they have received. Patients will be guided to explore their level of understanding and acceptance of these diagnoses. Facilitator will encourage patients to process their thoughts and feelings about the reactions of others to their diagnosis and will guide patients in identifying ways to discuss their diagnosis with significant others in their lives. This group will be process-oriented, with patients participating in exploration of their own experiences, giving and receiving support, and processing challenge from other group members.   Therapeutic Goals: 1. Patient will demonstrate understanding of diagnosis as evidenced by identifying two or more symptoms of the disorder 2. Patient will be able to express two feelings regarding the diagnosis 3. Patient will demonstrate their ability to communicate their needs through discussion and/or role play  Summary of Patient Progress: Pt continues to work towards their tx goals but has not yet reached them. Pt was able to appropriately participate in group discussion, and was able to offer support/validation to other group members. Pt reported two feelings he associates with his diagnosis are, "scared and superstitious." Pt reported he agrees with his diagnosis and knows, "I have to take meds. Even if I think I can manage everything without them, I know I need to stay on them."   Therapeutic Modalities:   Cognitive Behavioral Therapy Brief Therapy   Heidi DachKelsey Bashir Marchetti, MSW, LCSW 07/25/2017 2:10 PM

## 2017-07-26 MED ORDER — PALIPERIDONE PALMITATE 234 MG/1.5ML IM SUSP
234.0000 mg | Freq: Once | INTRAMUSCULAR | Status: AC
  Administered 2017-07-26: 234 mg via INTRAMUSCULAR
  Filled 2017-07-26: qty 1.5

## 2017-07-26 MED ORDER — PALIPERIDONE PALMITATE 156 MG/ML IM SUSP
156.0000 mg | Freq: Once | INTRAMUSCULAR | Status: AC
  Administered 2017-07-31: 156 mg via INTRAMUSCULAR
  Filled 2017-07-26: qty 1

## 2017-07-26 MED ORDER — CLONIDINE HCL 0.1 MG PO TABS
0.1000 mg | ORAL_TABLET | Freq: Four times a day (QID) | ORAL | Status: DC | PRN
  Administered 2017-07-30 – 2017-07-31 (×2): 0.1 mg via ORAL
  Filled 2017-07-26 (×2): qty 1

## 2017-07-26 MED ORDER — CLONIDINE HCL 0.1 MG PO TABS: 0.1000 mg | ORAL_TABLET | Freq: Three times a day (TID) | ORAL | Status: DC | PRN

## 2017-07-26 MED ORDER — CLONIDINE HCL 0.1 MG PO TABS
0.1000 mg | ORAL_TABLET | Freq: Two times a day (BID) | ORAL | Status: DC | PRN
  Administered 2017-07-26: 0.1 mg via ORAL
  Filled 2017-07-26: qty 1

## 2017-07-26 NOTE — [facility] Group Notes (Signed)
  07/26/2017  Time: 1PM  Type of Therapy/Topic:  Group Therapy:  Emotion Regulation  Participation Level:  Did Not Attend   Description of Group:    The purpose of this group is to assist patients in learning to regulate negative emotions and experience positive emotions. Patients will be guided to discuss ways in which they have been vulnerable to their negative emotions. These vulnerabilities will be juxtaposed with experiences of positive emotions or situations, and patients will be challenged to use positive emotions to combat negative ones. Special emphasis will be placed on coping with negative emotions in conflict situations, and patients will process healthy conflict resolution skills.  Therapeutic Goals: 1. Patient will identify two positive emotions or experiences to reflect on in order to balance out negative emotions 2. Patient will label two or more emotions that they find the most difficult to experience 3. Patient will demonstrate positive conflict resolution skills through discussion and/or role plays  Summary of Patient Progress: Pt was invited to attend group but chose not to attend. CSW will continue to encourage pt to attend group throughout their admission.   Therapeutic Modalities:   Cognitive Behavioral Therapy Feelings Identification Dialectical Behavioral Therapy  Heidi DachKelsey Felipe Paluch, MSW, LCSW 07/26/2017 1:22 PM

## 2017-07-26 NOTE — Progress Notes (Signed)
Recreation Therapy Notes  Date: 01.02.2018  Time: 3:00pm  Location: Craft room  Behavioral response: Appropriate  Group Type: Craft  Participation level: Active  Communication: Patient was social with peers and staff.  Comments: N/A  [name] LRT/CTRS        Henry Shaffer 07/26/2017 4:06 PM 

## 2017-07-26 NOTE — Progress Notes (Signed)
Recreation Therapy Notes  Date: 01.02.2019   Time: 9:30 am   Location: Craft Room   Behavioral response: Appropriate   Intervention Topic: Anger   Discussion/Intervention: Group content on today was focused on anger management. The group defined anger and reasons they become angry. Individuals expressed negative way they have dealt with anger in the past. Patients stated some positive ways they could deal with anger in the future. The group described how anger can affect your health and daily plans. Individuals participated in the intervention "Score your anger" where they had a chance to answer questions about themselves and get a score of their anger.  Clinical Observations/Feedback:  Patient came to group and defined anger as rage. He stated that when he gets angry things get out of control. Individual described that he does small things to people to get back at the people who makes him angry. Patient participated in the intervention and was social with peers and staff during group.  Antonique Langford LRT/CTRS         Marquite Attwood 07/26/2017 11:36 AM

## 2017-07-26 NOTE — Progress Notes (Signed)
Patient is alert and assertive in group, participation is adequate, cooperating with medication regimen, patient contract for safety of self and other, denies any suicidal ideations, sleep long hours at a time without any interruptions, 15 minute checks is in progress no respiratory distress noted.

## 2017-07-26 NOTE — Progress Notes (Signed)
[facility] MD Progress Note  07/26/2017 2:35 PM LADARIOUS Shaffer  MRN:  [id number] Subjective:  Pt states that he is doing well. HE states that his mood has improved. He denies feeling sad or depressed. He was sleeping when I went into his room today. He states that he is sleeping much better and trying to catch up on sleep. He is tolerating Risperdal well. Pt has been much more organized in conversation and much less paranoia. He denies AH, VH. Denies SI or HI.  I spoke with his brother, Henry Shaffer. He states that pt was taking a toll on his mother because he "was in a manic state." He was up all night and not sleeping. He states that pt has a problem with remembering to take medications. HE was discharged from [facility] and continued to have manic symptoms. He states that his mom is feeling better and resting better. They are happy to hear that he will be getting LAI. He states that he will relay this information to his mother.  Principal Problem: Schizoaffective disorder, depressive type (HCC) Diagnosis:   Patient Active Problem List   Diagnosis Date Noted  . Schizoaffective disorder, depressive type (HCC) [F25.1] 07/22/2017    Priority: High  . Asthma [J45.909] 02/14/2017  . HTN (hypertension) [I10] 02/13/2017  . Dyslipidemia [E78.5] 02/13/2017  . Diabetes (HCC) [E11.9] 02/13/2017  . Tobacco use disorder [F17.200] 02/13/2017  . Schizoaffective disorder, bipolar type (HCC) [F25.0] 06/29/2012  . Decreased vision [H54.7] 05/08/2012  . 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 Hypertension     Past Surgical History:  Procedure Laterality Date  . CARDIAC CATHETERIZATION     Family History: History reviewed. No pertinent family history. Family Psychiatric  History: See H7P Social History:  Social History   Substance and Sexual Activity   Alcohol Use Yes  . Alcohol/week: 0.6 oz  . Types: 1 Cans of beer per week   Comment: occassionally     Social History   Substance and Sexual Activity  Drug Use Yes  . Types: Marijuana   Comment: liquid marijuana 2 weeks ago    Social History   Socioeconomic History  . Marital status: Single    Spouse name: None  . Number of children: None  . Years of education: None  . Highest education level: None  Social Needs  . Financial resource strain: None  . Food insecurity - worry: None  . Food insecurity - inability: None  . Transportation needs - medical: None  . Transportation needs - non-medical: None  Occupational History  . None  Tobacco Use  . Smoking status: Current Every Day Smoker    Packs/day: 2.00  . Smokeless tobacco: Never Used  Substance and Sexual Activity  . Alcohol use: Yes    Alcohol/week: 0.6 oz    Types: 1 Cans of beer per week    Comment: occassionally  . Drug use: Yes    Types: Marijuana    Comment: liquid marijuana 2 weeks ago  . Sexual activity: No  Other Topics Concern  . None  Social History Narrative  . None   Additional Social History:                         Sleep: Good  Appetite:  Good  Current Medications: Current Facility-Administered Medications  Medication Dose Route Frequency Provider Last Rate Last Dose  . acetaminophen (TYLENOL) tablet 650 mg  650 mg Oral Q6H PRN Clapacs, Jackquline DenmarkJohn T, MD   650 mg at 07/25/17 1407  . albuterol (PROVENTIL HFA;VENTOLIN HFA) 108 (90 Base) MCG/ACT inhaler 2 puff  2 puff Inhalation Q6H Pucilowska, Jolanta B, MD   2 puff at 07/26/17 1428  . alum & mag hydroxide-simeth (MAALOX/MYLANTA) 200-200-20 MG/5ML suspension 30 mL  30 mL Oral Q4H PRN Clapacs, John T, MD      . amLODipine (NORVASC) tablet 10 mg  10 mg Oral Daily Clapacs, Jackquline DenmarkJohn T, MD   10 mg at 07/26/17 [id number]  . aspirin EC tablet 81 mg  81 mg Oral Daily Clapacs, John T, MD   81 mg at 07/26/17 0812  . atorvastatin (LIPITOR) tablet 80 mg  80 mg  Oral q1800 Clapacs, Jackquline DenmarkJohn T, MD   80 mg at 07/25/17 1657  . cloNIDine (CATAPRES) tablet 0.1 mg  0.1 mg Oral BID PRN McNew, Ileene HutchinsonHolly R, MD      . divalproex (DEPAKOTE ER) 24 hr tablet 1,000 mg  1,000 mg Oral BID AC & HS Pucilowska, Jolanta B, MD   1,000 mg at 07/26/17 0814  . fluvoxaMINE (LUVOX) tablet 50 mg  50 mg Oral QHS Pucilowska, Jolanta B, MD   50 mg at 07/25/17 2114  . hydrOXYzine (ATARAX/VISTARIL) tablet 50 mg  50 mg Oral TID PRN Clapacs, Jackquline DenmarkJohn T, MD   50 mg at 07/23/17 1937  . lisinopril (PRINIVIL,ZESTRIL) tablet 40 mg  40 mg Oral Daily Clapacs, Jackquline DenmarkJohn T, MD   40 mg at 07/26/17 0907  . magnesium hydroxide (MILK OF MAGNESIA) suspension 30 mL  30 mL Oral Daily PRN Clapacs, John T, MD      . metFORMIN (GLUCOPHAGE) tablet 1,000 mg  1,000 mg Oral BID WC Clapacs, John T, MD   1,000 mg at 07/26/17 [id number]  . nicotine (NICODERM CQ - dosed in mg/24 hours) patch 21 mg  21 mg Transdermal Daily Clapacs, Jackquline DenmarkJohn T, MD   21 mg at 07/26/17 [id number]  . [START ON 07/31/2017] paliperidone (INVEGA SUSTENNA) injection 156 mg  156 mg Intramuscular Once McNew, Holly R, MD      . paliperidone (INVEGA SUSTENNA) injection 234 mg  234 mg Intramuscular Once McNew, Holly R, MD      . risperiDONE (RISPERDAL) tablet 2 mg  2 mg Oral BID Pucilowska, Jolanta B, MD   2 mg at 07/26/17 0812  . traZODone (DESYREL) tablet 50 mg  50 mg Oral QHS PRN McNew, Ileene HutchinsonHolly R, MD        Lab Results: No results found for this or any previous visit (from the past 48 hour(s)).  Blood Alcohol level:  Lab Results  Component Value Date   ETH <10 07/21/2017   ETH <5 02/11/2017    Metabolic Disorder Labs: Lab Results  Component Value Date   HGBA1C 7.5 (H) 07/22/2017   MPG 168.55 07/22/2017   MPG 209 02/15/2017   No results found for: PROLACTIN Lab Results  Component Value Date   CHOL 68 07/22/2017   TRIG 88 07/22/2017   HDL 27 (L) 07/22/2017   CHOLHDL 2.5 07/22/2017   VLDL 18 07/22/2017   LDLCALC 23 07/22/2017   LDLCALC 26 02/15/2017     Physical Findings: AIMS: Facial and Oral Movements Muscles of Facial Expression: None, normal Lips and Perioral Area: None, normal Jaw: None, normal Tongue: None, normal,Extremity Movements Upper (arms, wrists, hands, fingers): None, normal Lower (legs,  knees, ankles, toes): None, normal, Trunk Movements Neck, shoulders, hips: None, normal, Overall Severity Severity of abnormal movements (highest score from questions above): None, normal Incapacitation due to abnormal movements: None, normal Patient's awareness of abnormal movements (rate only patient's report): No Awareness, Dental Status Current problems with teeth and/or dentures?: No Does patient usually wear dentures?: No  CIWA:  CIWA-Ar Total: 1 COWS:  COWS Total Score: 1  Musculoskeletal: Strength & Muscle Tone: within normal limits Gait & Station: normal Patient leans: N/A  Psychiatric Specialty Exam: Physical Exam  Nursing note and vitals reviewed.   Review of Systems  All other systems reviewed and are negative.   Blood pressure (!) 156/100, pulse 95, temperature (!) 97.5 F (36.4 C), temperature source Oral, resp. rate 18, height 5\' 10"  (1.778 m), weight 99.8 kg (220 lb), SpO2 97 %.Body mass index is 31.57 kg/m.  General Appearance: Casual  Eye Contact:  Fair  Speech:  Clear and Coherent  Volume:  Normal  Mood:  Euthymic  Affect:  Appropriate  Thought Process:  Coherent and Goal Directed  Orientation:  Full (Time, Place, and Person)  Thought Content:  Negative  Suicidal Thoughts:  No  Homicidal Thoughts:  No  Memory:  Immediate;   Fair  Judgement:  Fair  Insight:  Fair  Psychomotor Activity:  Normal  Concentration:  Concentration: Fair  Recall:  [employer] of Knowledge:  Fair  Language:  Fair  Akathisia:  No      Assets:  Communication Skills Desire for Improvement Housing Resilience  ADL's:  Intact  Cognition:  WNL  Sleep:  Number of Hours: 6.45     Treatment Plan Summary: 42 yo male  admitted due to acute manic episode. Mania is resolving and he is sleeping much better. He is tolerating Risperdal and plan is to give initial dose of [name] today and second dose on Monday.   Plan:  Mood and psychosis -Will given Invega Sustenna 234 mg today and next dose on Monday -Will d/c oral Risperdal tomorrow -Continue Depakote 1000 mg BID. Will check level on Friday  Anxiety -Continue Luvox 50 mg qhs  Insomnia-resolving -prn trazodone  DM -Continue Metformin 1000 mg BID  HTN -BP elevated. BP was also elevated at [facility] and hospitalist was consulted and recommended prn medications and follow up with his cardiologist which he has in January -Continue Norvasc and Lisinopril -will add prn Clonidine  Dispo -Pt will return home with his mother on discharge. I spoke with his brother today   Haskell Riling, MD 07/26/2017, 2:35 PM

## 2017-07-26 NOTE — Progress Notes (Signed)
Invega injection given to patient in the R) deltoid. Education provided to the patient at the time of injection. No adverse reaction noted at this time.

## 2017-07-27 ENCOUNTER — Encounter: Payer: Self-pay | Admitting: Internal Medicine

## 2017-07-27 MED ORDER — DOXYCYCLINE HYCLATE 100 MG PO TABS
100.0000 mg | ORAL_TABLET | Freq: Two times a day (BID) | ORAL | Status: DC
  Administered 2017-07-27 – 2017-08-01 (×10): 100 mg via ORAL
  Filled 2017-07-27 (×13): qty 1

## 2017-07-27 MED ORDER — IBUPROFEN 600 MG PO TABS
600.0000 mg | ORAL_TABLET | ORAL | Status: DC | PRN
  Administered 2017-07-27 – 2017-07-31 (×9): 600 mg via ORAL
  Filled 2017-07-27 (×11): qty 1

## 2017-07-27 NOTE — Progress Notes (Signed)
Patient ID: Massie Kluverlliot R Chandler, male   DOB: 06/26/1976, 42 y.o.   MRN: [id number] Fixated on the rash on the back (posterior) left ear, a dime size of clustered, localized red rash; not appeared to be "Viviann SpareSteven Johnson's", MAR reviewed for indication of rash, will monitor for Depakote SE, measure the size spread increase and provide timely feedback. Rash anxiety related was relieved with re-assurance. Denied pain, denied SI/HI/AVH.

## 2017-07-27 NOTE — [facility] Group Notes (Signed)
[facility] Group Notes:  (Nursing/MHT/Case Management/Adjunct)  Date:  07/27/2017  Time:  2:22 AM  Type of Therapy:  Psychoeducational Skills  Participation Level:  Active  Participation Quality:  Appropriate and Attentive  Affect:  Appropriate  Cognitive:  Alert and Appropriate  Insight:  Appropriate and Good  Engagement in Group:  Engaged  Modes of Intervention:  Discussion, Socialization and Support  Summary of Progress/Problems:  Henry SingerJustin  [name] 07/27/2017, 2:22 AM

## 2017-07-27 NOTE — Progress Notes (Addendum)
[facility] MD Progress Note  07/27/2017 2:02 PM Henry Shaffer  MRN:  [id number] Subjective:  Pt states that he is in a lot of pain today due to rash on his ear and scalp. HE was not able to sleep because of this rash. Otherwise, he states that he is doing okay. Mood is improving. HE is much more organized in conversation and sleeping better. Denies AH, VH, SI, HI.  Principal Problem: Schizoaffective disorder, depressive type (HCC) Diagnosis:   Patient Active Problem List   Diagnosis Date Noted  . Schizoaffective disorder, depressive type (HCC) [F25.1] 07/22/2017    Priority: High  . Asthma [J45.909] 02/14/2017  . HTN (hypertension) [I10] 02/13/2017  . Dyslipidemia [E78.5] 02/13/2017  . Diabetes (HCC) [E11.9] 02/13/2017  . Tobacco use disorder [F17.200] 02/13/2017  . Schizoaffective disorder, bipolar type (HCC) [F25.0] 06/29/2012  . Decreased vision [H54.7] 05/08/2012  . 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 Hypertension     Past Surgical History:  Procedure Laterality Date  . CARDIAC CATHETERIZATION     Family History: History reviewed. No pertinent family history. Family Psychiatric  History: See H&P Social History:  Social History   Substance and Sexual Activity  Alcohol Use Yes  . Alcohol/week: 0.6 oz  . Types: 1 Cans of beer per week   Comment: occassionally     Social History   Substance and Sexual Activity  Drug Use Yes  . Types: Marijuana   Comment: liquid marijuana 2 weeks ago    Social History   Socioeconomic History  . Marital status: Single    Spouse name: None  . Number of children: None  . Years of education: None  . Highest education level: None  Social Needs  . Financial resource strain: None  . Food insecurity - worry: None  . Food insecurity - inability: None  . Transportation needs -  medical: None  . Transportation needs - non-medical: None  Occupational History  . None  Tobacco Use  . Smoking status: Current Every Day Smoker    Packs/day: 2.00  . Smokeless tobacco: Never Used  Substance and Sexual Activity  . Alcohol use: Yes    Alcohol/week: 0.6 oz    Types: 1 Cans of beer per week    Comment: occassionally  . Drug use: Yes    Types: Marijuana    Comment: liquid marijuana 2 weeks ago  . Sexual activity: No  Other Topics Concern  . None  Social History Narrative  . None   Additional Social History:                         Sleep: Good  Appetite:  Fair  Current Medications: Current Facility-Administered Medications  Medication Dose Route Frequency Provider Last Rate Last Dose  . acetaminophen (TYLENOL) tablet 650 mg  650 mg Oral Q6H PRN Clapacs, Jackquline Denmark, MD   650 mg at 07/27/17 0905  . albuterol (PROVENTIL HFA;VENTOLIN HFA) 108 (90 Base) MCG/ACT inhaler 2 puff  2 puff Inhalation Q6H Pucilowska, Jolanta B, MD   2 puff at 07/27/17 0900  . alum & mag hydroxide-simeth (MAALOX/MYLANTA) 200-200-20 MG/5ML suspension 30 mL  30 mL Oral Q4H PRN Clapacs, John T, MD      . amLODipine (NORVASC) tablet 10 mg  10 mg Oral Daily Clapacs, John T,  MD   10 mg at 07/27/17 0900  . aspirin EC tablet 81 mg  81 mg Oral Daily Clapacs, Jackquline DenmarkJohn T, MD   81 mg at 07/27/17 0859  . atorvastatin (LIPITOR) tablet 80 mg  80 mg Oral q1800 Clapacs, Jackquline DenmarkJohn T, MD   80 mg at 07/26/17 1711  . cloNIDine (CATAPRES) tablet 0.1 mg  0.1 mg Oral Q6H PRN Milianna Ericsson R, MD      . divalproex (DEPAKOTE ER) 24 hr tablet 1,000 mg  1,000 mg Oral BID AC & HS Pucilowska, Jolanta B, MD   1,000 mg at 07/27/17 0900  . fluvoxaMINE (LUVOX) tablet 50 mg  50 mg Oral QHS Pucilowska, Jolanta B, MD   50 mg at 07/26/17 2131  . hydrOXYzine (ATARAX/VISTARIL) tablet 50 mg  50 mg Oral TID PRN Clapacs, Jackquline DenmarkJohn T, MD   50 mg at 07/23/17 1937  . ibuprofen (ADVIL,MOTRIN) tablet 600 mg  600 mg Oral Q4H PRN Haskell RilingMcNew, Tarren Sabree R, MD    600 mg at 07/27/17 1302  . lisinopril (PRINIVIL,ZESTRIL) tablet 40 mg  40 mg Oral Daily Clapacs, John T, MD   40 mg at 07/27/17 0900  . magnesium hydroxide (MILK OF MAGNESIA) suspension 30 mL  30 mL Oral Daily PRN Clapacs, John T, MD      . metFORMIN (GLUCOPHAGE) tablet 1,000 mg  1,000 mg Oral BID WC Clapacs, John T, MD   1,000 mg at 07/27/17 0900  . nicotine (NICODERM CQ - dosed in mg/24 hours) patch 21 mg  21 mg Transdermal Daily Clapacs, John T, MD   21 mg at 07/27/17 0900  . [START ON 07/31/2017] paliperidone (INVEGA SUSTENNA) injection 156 mg  156 mg Intramuscular Once Dorthula Bier R, MD      . risperiDONE (RISPERDAL) tablet 2 mg  2 mg Oral BID Pucilowska, Jolanta B, MD   2 mg at 07/27/17 0900  . traZODone (DESYREL) tablet 50 mg  50 mg Oral QHS PRN Nabiha Planck, Ileene HutchinsonHolly R, MD        Lab Results: No results found for this or any previous visit (from the past 48 hour(s)).  Blood Alcohol level:  Lab Results  Component Value Date   ETH <10 07/21/2017   ETH <5 02/11/2017    Metabolic Disorder Labs: Lab Results  Component Value Date   HGBA1C 7.5 (H) 07/22/2017   MPG 168.55 07/22/2017   MPG 209 02/15/2017   No results found for: PROLACTIN Lab Results  Component Value Date   CHOL 68 07/22/2017   TRIG 88 07/22/2017   HDL 27 (L) 07/22/2017   CHOLHDL 2.5 07/22/2017   VLDL 18 07/22/2017   LDLCALC 23 07/22/2017   LDLCALC 26 02/15/2017    Physical Findings: AIMS: Facial and Oral Movements Muscles of Facial Expression: None, normal Lips and Perioral Area: None, normal Jaw: None, normal Tongue: None, normal,Extremity Movements Upper (arms, wrists, hands, fingers): None, normal Lower (legs, knees, ankles, toes): None, normal, Trunk Movements Neck, shoulders, hips: None, normal, Overall Severity Severity of abnormal movements (highest score from questions above): None, normal Incapacitation due to abnormal movements: None, normal Patient's awareness of abnormal movements (rate only patient's  report): No Awareness, Dental Status Current problems with teeth and/or dentures?: No Does patient usually wear dentures?: No  CIWA:  CIWA-Ar Total: 1 COWS:  COWS Total Score: 1  Musculoskeletal: Strength & Muscle Tone: within normal limits Gait & Station: normal Patient leans: N/A  Psychiatric Specialty Exam: Physical Exam  Nursing note and vitals reviewed. Skin: Rash  noted.  Rash with multiple pustular areas, very painful to touch  Review of Systems  Skin: Positive for rash.  All other systems reviewed and are negative.   Blood pressure (!) 144/96, pulse 79, temperature (!) 97.5 F (36.4 C), temperature source Oral, resp. rate 18, height 5\' 10"  (1.778 m), weight 99.8 kg (220 lb), SpO2 97 %.Body mass index is 31.57 kg/m.  General Appearance: Disheveled  Eye Contact:  Fair  Speech:  Clear and Coherent  Volume:  Normal  Mood:  Euthymic  Affect:  Appropriate and Congruent  Thought Process:  Coherent and Goal Directed  Orientation:  Full (Time, Place, and Person)  Thought Content:  Negative  Suicidal Thoughts:  No  Homicidal Thoughts:  No  Memory:  Immediate;   Fair  Judgement:  Fair  Insight:  Fair  Psychomotor Activity:  Normal  Concentration:  Concentration: Fair  Recall:  [employer] of Knowledge:  Fair  Language:  Fair  Akathisia:  No      Assets:  Communication Skills Desire for Improvement Housing  ADL's:  Intact  Cognition:  WNL  Sleep:  Number of Hours: 7.45     Treatment Plan Summary: 42 yo male admitted due to psychosis and mania. Symptoms are improving and much more organized in thinking. HE received [name] initial injection yesterday and tolerated this well. He has severe pain due to a rash on his left ear and scalp. Concerning for possibly shingles. I have consulted internal medicine to take a look at it.   Plan:  Mood and Psychosis -Pt was given initial dose of Invega Sustenna yesterday. Second injection scheduled for Monday -Will d/c  oral Risperdal -Continue Depakote 1000 mg BID and check level tomorrow  Rash -Consult Internal medicine. Appreciate their recommendations  Anxiety -Continue Luvox 50 mg daily  Insomnia -resolving  DM _Continue Metformin 1000 mg BID  HTN -BP slightly better today. Will continue prn clonidine  Dispo -Pt will return home with his mother on discharge. I spoke with his brother yesterday  Haskell Riling, MD 07/27/2017, 2:02 PM

## 2017-07-27 NOTE — Plan of Care (Signed)
Patient slept for Estimated Hours of 7.45; Precautionary checks every 15 minutes for safety maintained, room free of safety hazards, patient sustains no injury or falls during this shift.  

## 2017-07-27 NOTE — B[facility] Group Notes (Signed)
07/27/2017 1PM  Type of Therapy/Topic:  Group Therapy:  Balance in Life  Participation Level:  Active  Description of Group:   This group will address the concept of balance and how it feels and looks when one is unbalanced. Patients will be encouraged to process areas in their lives that are out of balance and identify reasons for remaining unbalanced. Facilitators will guide patients in utilizing problem-solving interventions to address and correct the stressor making their life unbalanced. Understanding and applying boundaries will be explored and addressed for obtaining and maintaining a balanced life. Patients will be encouraged to explore ways to assertively make their unbalanced needs known to significant others in their lives, using other group members and facilitator for support and feedback.  Therapeutic Goals: 1. Patient will identify two or more emotions or situations they have that consume much of in their lives. 2. Patient will identify signs/triggers that life has become out of balance:  3. Patient will identify two ways to set boundaries in order to achieve balance in their lives:  4. Patient will demonstrate ability to communicate their needs through discussion and/or role plays  Summary of Patient Progress:  Actively and appropriately engaged in the group. Patient was able to provide support and validation to other group members.Patient practiced active listening when interacting with the facilitator and other group members Patient in still in the process of obtaining treatment goals. Henry Shaffer says "my emotions is something that has consumed much of my life. I know when my life has become off balance when I begin to do weird things like witting on the walls." Henry Shaffer then continued to identify behaviors that led him to call 911 and get him some help before coming into the hospital.      Therapeutic Modalities:   Cognitive Behavioral Therapy Solution-Focused  Therapy Assertiveness Training  Johny Shearsassandra  Davelyn Gwinn, LCSW

## 2017-07-27 NOTE — Consult Note (Signed)
[facility] - [place] at [facility]   PATIENT NAME: Henry Shaffer    MR#:  [id number]  DATE OF BIRTH:  16-Mar-1976  DATE OF ADMISSION:  07/22/2017  PRIMARY CARE PHYSICIAN: Anselm Jungling, MD   REQUESTING/REFERRING PHYSICIAN: Dr. Uvaldo Rising  CHIEF COMPLAINT:  No chief complaint on file.   HISTORY OF PRESENT ILLNESS:  Henry Shaffer  is a 42 y.o. male with a known history of paranoid schizophrenia with depression and anxiety, hypertension, diabetes mellitus and hyperlipidemia presents from home secondary to disorganized and bizarre behavior. He has been admitted to behavioral medicine unit and is being treated for his schizophrenia and depression. Patient states he has been feeling much better since admission. He had bilateral ear surgeries done and has follow rules behind both his years. He noticed redness behind his right ear with no open wounds for 2 days now. There is a spot on the posterior skull as a patch of erythema with significant tenderness. No abscess noted. No open wounds noted. Since the redness and pain have been worsening, medical consult has been requested. Patient denies any fevers or chills. No nausea or vomiting or other symptoms.  PAST MEDICAL HISTORY:   Past Medical History:  Diagnosis Date  . Anxiety   . Depression   . Diabetes (HCC)   . Hyperlipidemia   . Hypertension   . Schizophrenia (HCC)     PAST SURGICAL HISTOIRY:   Past Surgical History:  Procedure Laterality Date  . CARDIAC CATHETERIZATION      SOCIAL HISTORY:   Social History   Tobacco Use  . Smoking status: Current Every Day Smoker    Packs/day: 2.00  . Smokeless tobacco: Never Used  Substance Use Topics  . Alcohol use: Yes    Alcohol/week: 0.6 oz    Types: 1 Cans of beer per week    Comment: occassionally    FAMILY HISTORY:   Family History  Problem Relation Age of Onset  . Diabetes Mother   . CAD Mother   . Heart failure Father   . Diabetes Father      DRUG ALLERGIES:   Allergies  Allergen Reactions  . Codeine     Unknown. Pt's father reported this in the past, but pt doesn't know his reaction.  . Erythromycin Other (See Comments)    Unknown. Pt's father reported this in the past, but pt doesn't know his reaction.  . Sulfa Antibiotics     Unknown. Pt's father reported this in the past, but pt doesn't know his reaction.    REVIEW OF SYSTEMS:   Review of Systems  Constitutional: Negative for chills, fever, malaise/fatigue and weight loss.  HENT: Negative for ear discharge, ear pain, hearing loss and nosebleeds.   Eyes: Negative for blurred vision, double vision and photophobia.  Respiratory: Negative for cough, hemoptysis, shortness of breath and wheezing.   Cardiovascular: Negative for chest pain, palpitations, orthopnea and leg swelling.  Gastrointestinal: Negative for abdominal pain, constipation, diarrhea, heartburn, melena, nausea and vomiting.  Genitourinary: Negative for dysuria, frequency and urgency.  Musculoskeletal: Positive for myalgias. Negative for back pain and neck pain.  Skin: Negative for rash.  Neurological: Negative for dizziness, tremors, sensory change, speech change and focal weakness.  Endo/Heme/Allergies: Does not bruise/bleed easily.  Psychiatric/Behavioral: Positive for depression.      MEDICATIONS AT HOME:   Prior to Admission medications   Medication Sig Start Date End Date Taking? Authorizing Provider  albuterol (PROVENTIL HFA;VENTOLIN HFA) 108 (90 Base) MCG/ACT inhaler Inhale  2 puffs into the lungs every 6 (six) hours as needed for wheezing or shortness of breath.     [provider]  amLODipine (NORVASC) 10 MG tablet Take 1 tablet (10 mg total) by mouth daily. 02/19/17   Jimmy Footman, MD  ARIPiprazole (ABILIFY) 10 MG tablet Take 1 tablet (10 mg total) by mouth daily. 02/20/17   Jimmy Footman, MD  aspirin 81 MG chewable tablet Chew 81 mg by mouth daily.      [provider]  atorvastatin (LIPITOR) 80 MG tablet Take 1 tablet (80 mg total) by mouth daily. 02/19/17   Jimmy Footman, MD  divalproex (DEPAKOTE ER) 500 MG 24 hr tablet Take 3 tablets (1,500 mg total) by mouth daily. 02/20/17   Jimmy Footman, MD  lisinopril (PRINIVIL,ZESTRIL) 20 MG tablet Take 1 tablet (20 mg total) by mouth daily. 02/20/17   Jimmy Footman, MD  metFORMIN (GLUCOPHAGE) 1000 MG tablet Take 1 tablet (1,000 mg total) by mouth 2 (two) times daily. 02/19/17   Jimmy Footman, MD  traZODone (DESYREL) 100 MG tablet Take 1 tablet (100 mg total) by mouth at bedtime. 02/19/17   Jimmy Footman, MD      VITAL SIGNS:  Blood pressure (!) 144/96, pulse 79, temperature (!) 97.5 F (36.4 C), temperature source Oral, resp. rate 18, height 5\' 10"  (1.778 m), weight 99.8 kg (220 lb), SpO2 97 %.  PHYSICAL EXAMINATION:   Physical Exam  HENT:  Head:    A patch of erythema noted on posterior scalp close to the right ear. Redness behind the right ear noted with significant pain   GENERAL:  42 y.o.-year-old patient sitting in the chair with no acute distress.  EYES: Pupils equal, round, reactive to light and accommodation. No scleral icterus. Extraocular muscles intact.  HEENT: Head atraumatic, normocephalic. Oropharynx and nasopharynx clear.  NECK:  Supple, no jugular venous distention. No thyroid enlargement, no tenderness. No cervical lymphadenopathy on exam LUNGS: Normal breath sounds bilaterally, no wheezing, rales,rhonchi or crepitation. No use of accessory muscles of respiration.  CARDIOVASCULAR: S1, S2 normal. No murmurs, rubs, or gallops.  ABDOMEN: Soft, nontender, nondistended. Bowel sounds present. No organomegaly or mass.  EXTREMITIES: No pedal edema, cyanosis, or clubbing.  NEUROLOGIC: Cranial nerves II through XII are intact. Muscle strength 5/5 in all extremities. Sensation intact. Gait not checked.  PSYCHIATRIC:  The patient is alert and oriented x 3. Flat affect noted. SKIN: No obvious rash, lesion, or ulcer.   LABORATORY PANEL:   CBC Recent Labs  Lab 07/21/17 1835  WBC 10.7*  HGB 14.3  HCT 42.5  PLT 254   ------------------------------------------------------------------------------------------------------------------  Chemistries  Recent Labs  Lab 07/21/17 1835  NA 134*  K 3.6  CL 104  CO2 22  GLUCOSE 131*  BUN 15  CREATININE 1.28*  CALCIUM 8.9  AST 25  ALT 22  ALKPHOS 73  BILITOT 1.0   ------------------------------------------------------------------------------------------------------------------  Cardiac Enzymes No results for input(s): TROPONINI in the last 168 hours. ------------------------------------------------------------------------------------------------------------------  RADIOLOGY:  No results found.  EKG:   Orders placed or performed during the hospital encounter of 07/22/17  . EKG 12-Lead  . EKG 12-Lead  . EKG 12-Lead  . EKG 12-Lead  . EKG 12-Lead  . EKG 12-Lead  . EKG 12-Lead  . EKG 12-Lead  . EKG 12-Lead  . EKG 12-Lead    IMPRESSION AND PLAN:   Henry Shaffer  is a 42 y.o. male with a known history of paranoid schizophrenia with depression and anxiety, hypertension, diabetes mellitus and  hyperlipidemia presents from home secondary to disorganized and bizarre behavior. Medical consult requested for rash on the scalp.  1. Scalp cellulitis- from behind the right ear and likely from a scalp wound - does not look like shingles - Stat doxycycline as patient has allergies to sulfa antibiotics -Pain control and monitor. No fluctuant area noted. Will hold off on imaging studies at this time.  2. Diabetes mellitus-A1c of 7.5 - on metformin  3. HTN- on lisinopril and norvasc  4. Schizophrenia with depression- management per psych - Continue Depakote, Luvox, invega and risperidone  5. Tobacco use disorder-on nicotine patch  6. DVT  prophylaxis-patient is ambulatory    All the records are reviewed and case discussed with Consulting provider. Management plans discussed with the patient, family and they are in agreement.  CODE STATUS: Full Code  TOTAL TIME TAKING CARE OF THIS PATIENT: 50 minutes.    Henry Shaffer,Emersyn Wyss M.D on 07/27/2017 at 2:58 PM  Between 7am to 6pm - Pager - [phone]  After 6pm go to [email or web address] - password [email or web address] [facility]  [facility]  Office  [phone]  CC: Primary care Physician: Anselm JunglingShaheen, Amy, MD

## 2017-07-27 NOTE — Progress Notes (Signed)
D- Patient alert and oriented. Patient presents in a pleasant mood on assessment stating that "I'm fine, just in pain" rating his pain level at a "7/10" stating that he has a rash on the left side of his neck " it burns, I think it's from when my nephew cut my hair". Patient denies any signs/symptoms of depression/anxiety at this time. Patient also denies SI, HI, AVH, at this time. Patient's goal for today is "getting involved in outpatient activity later" and he will accomplish this by "talk to my doctor".  A- Scheduled medications administered to patient, per MD orders. Support and encouragement provided.  Routine safety checks conducted every 15 minutes.  Patient informed to notify staff with problems or concerns.  R- No adverse drug reactions noted. Patient contracts for safety at this time. Patient compliant with medications and treatment plan. Patient receptive, calm, and cooperative. Patient interacts well with others on the unit.  Patient remains safe at this time.

## 2017-07-27 NOTE — BHH Group Notes (Signed)
LCSW Group Therapy Note 07/27/2017 9:00 AM  Type of Therapy and Topic:  Group Therapy:  Setting Goals  Participation Level:  Did Not Attend  Description of Group: In this process group, patients discussed using strengths to work toward goals and address challenges.  Patients identified two positive things about themselves and one goal they were working on.  Patients were given the opportunity to share openly and support each other's plan for self-empowerment.  The group discussed the value of gratitude and were encouraged to have a daily reflection of positive characteristics or circumstances.  Patients were encouraged to identify a plan to utilize their strengths to work on current challenges and goals.  Therapeutic Goals 1. Patient will verbalize personal strengths/positive qualities and relate how these can assist with achieving desired personal goals 2. Patients will verbalize affirmation of peers plans for personal change and goal setting 3. Patients will explore the value of gratitude and positive focus as related to successful achievement of goals 4. Patients will verbalize a plan for regular reinforcement of personal positive qualities and circumstances.  Summary of Patient Progress:       Therapeutic Modalities Cognitive Behavioral Therapy Motivational Interviewing    Alease FrameSonya S Shyniece Scripter, LCSW 07/27/2017 5:14 PM

## 2017-07-27 NOTE — Progress Notes (Signed)
Recreation Therapy Notes  Date: 01.03.2019  Time: 9:30 am  Location: Craft Room  Behavioral response: N/A  Intervention Topic: Values  Discussion/Intervention: Patient did not attend group. Clinical Observations/Feedback:  Patient did not attend group.  Abhiram Criado LRT/CTRS         Pranika Finks 07/27/2017 10:39 AM

## 2017-07-27 NOTE — B[facility] Group Notes (Signed)
B[facility] Group Notes:  (Nursing/MHT/Case Management/Adjunct)  Date:  07/27/2017  Time:  9:43 PM  Type of Therapy:  Group Therapy  Participation Level:  Active  Participation Quality:  Appropriate  Affect:  Appropriate  Cognitive:  Alert  Insight:  Good  Engagement in Group:  Engaged  Modes of Intervention:  Support  Summary of Progress/Problems:  Henry Shaffer 07/27/2017, 9:43 PM

## 2017-07-27 NOTE — Plan of Care (Signed)
Patient verbalizes understanding of the general information that's been provided to him and has no further questions or concerns at this time. Patient denies signs/symptoms of depression/anxiety as well as SI/HI/AVH at this time. Patient verbalizes understanding of his prescribed therapeutic/medication regimen and has no further questions/concerns at this time. Patient has been in compliance with his medication regimen thus far. Patient  has remained free from injury on the unit and reports that he slept almost eight hours last night. Patient states that he has attended and participated in unit groups whenever possible. Patient is safe on the unit at this time.

## 2017-07-28 MED ORDER — OXYCODONE-ACETAMINOPHEN 5-325 MG PO TABS
1.0000 | ORAL_TABLET | Freq: Once | ORAL | Status: AC
  Administered 2017-07-28: 1 via ORAL
  Filled 2017-07-28: qty 1

## 2017-07-28 NOTE — Progress Notes (Signed)
Patient ID: Henry Shaffer, male   DOB: 11-May-1976, 42 y.o.   MRN: [id number] Less preoccupied about his rash this evening, visible in the milieu socializing with peers, attended the Wrap Up Group, coherent/logical, non tangential; denied SI/HI/AVH.

## 2017-07-28 NOTE — Tx Team (Signed)
Interdisciplinary Treatment and Diagnostic Plan Update  07/28/2017 Time of Session: 1100 Henry Shaffer MRN: [id number]  Principal Diagnosis: Schizoaffective disorder, depressive type (HCC)  Secondary Diagnoses: Principal Problem:   Schizoaffective disorder, depressive type (HCC) Active Problems:   HTN (hypertension)   Dyslipidemia   Diabetes (HCC)   Tobacco use disorder   Current Medications:  Current Facility-Administered Medications  Medication Dose Route Frequency Provider Last Rate Last Dose  . acetaminophen (TYLENOL) tablet 650 mg  650 mg Oral Q6H PRN Clapacs, Jackquline Denmark, MD   650 mg at 07/27/17 0905  . albuterol (PROVENTIL HFA;VENTOLIN HFA) 108 (90 Base) MCG/ACT inhaler 2 puff  2 puff Inhalation Q6H Pucilowska, Jolanta B, MD   2 puff at 07/28/17 0819  . alum & mag hydroxide-simeth (MAALOX/MYLANTA) 200-200-20 MG/5ML suspension 30 mL  30 mL Oral Q4H PRN Clapacs, John T, MD      . amLODipine (NORVASC) tablet 10 mg  10 mg Oral Daily Clapacs, Jackquline Denmark, MD   10 mg at 07/28/17 0819  . aspirin EC tablet 81 mg  81 mg Oral Daily Clapacs, Jackquline Denmark, MD   81 mg at 07/28/17 0819  . atorvastatin (LIPITOR) tablet 80 mg  80 mg Oral q1800 Clapacs, Jackquline Denmark, MD   80 mg at 07/27/17 1732  . cloNIDine (CATAPRES) tablet 0.1 mg  0.1 mg Oral Q6H PRN McNew, Holly R, MD      . divalproex (DEPAKOTE ER) 24 hr tablet 1,000 mg  1,000 mg Oral BID AC & HS Pucilowska, Jolanta B, MD   1,000 mg at 07/28/17 0819  . doxycycline (VIBRA-TABS) tablet 100 mg  100 mg Oral Q12H Enid Baas, MD   100 mg at 07/28/17 0819  . fluvoxaMINE (LUVOX) tablet 50 mg  50 mg Oral QHS Pucilowska, Jolanta B, MD   50 mg at 07/27/17 2130  . hydrOXYzine (ATARAX/VISTARIL) tablet 50 mg  50 mg Oral TID PRN Clapacs, Jackquline Denmark, MD   50 mg at 07/28/17 1012  . ibuprofen (ADVIL,MOTRIN) tablet 600 mg  600 mg Oral Q4H PRN McNew, Ileene Hutchinson, MD   600 mg at 07/28/17 1012  . lisinopril (PRINIVIL,ZESTRIL) tablet 40 mg  40 mg Oral Daily Clapacs, Jackquline Denmark, MD   40  mg at 07/28/17 0819  . magnesium hydroxide (MILK OF MAGNESIA) suspension 30 mL  30 mL Oral Daily PRN Clapacs, John T, MD      . metFORMIN (GLUCOPHAGE) tablet 1,000 mg  1,000 mg Oral BID WC Clapacs, John T, MD   1,000 mg at 07/28/17 0819  . nicotine (NICODERM CQ - dosed in mg/24 hours) patch 21 mg  21 mg Transdermal Daily Clapacs, Jackquline Denmark, MD   21 mg at 07/28/17 0819  . [START ON 07/31/2017] paliperidone (INVEGA SUSTENNA) injection 156 mg  156 mg Intramuscular Once McNew, Holly R, MD      . traZODone (DESYREL) tablet 50 mg  50 mg Oral QHS PRN McNew, Ileene Hutchinson, MD       PTA Medications: Medications Prior to Admission  Medication Sig Dispense Refill Last Dose  . albuterol (PROVENTIL HFA;VENTOLIN HFA) 108 (90 Base) MCG/ACT inhaler Inhale 2 puffs into the lungs every 6 (six) hours as needed for wheezing or shortness of breath.    PRN at PRN  . amLODipine (NORVASC) 10 MG tablet Take 1 tablet (10 mg total) by mouth daily. 30 tablet 0   . ARIPiprazole (ABILIFY) 10 MG tablet Take 1 tablet (10 mg total) by mouth daily. 30 tablet 0   .  aspirin 81 MG chewable tablet Chew 81 mg by mouth daily.    unknown at unknown  . atorvastatin (LIPITOR) 80 MG tablet Take 1 tablet (80 mg total) by mouth daily. 30 tablet 0   . divalproex (DEPAKOTE ER) 500 MG 24 hr tablet Take 3 tablets (1,500 mg total) by mouth daily. 90 tablet 0   . lisinopril (PRINIVIL,ZESTRIL) 20 MG tablet Take 1 tablet (20 mg total) by mouth daily. 30 tablet 0   . metFORMIN (GLUCOPHAGE) 1000 MG tablet Take 1 tablet (1,000 mg total) by mouth 2 (two) times daily. 60 tablet 0   . traZODone (DESYREL) 100 MG tablet Take 1 tablet (100 mg total) by mouth at bedtime. 30 tablet 0     Patient Stressors: Health problems Medication change or noncompliance Other: "my family lineage"  Patient Strengths: Ability for insight Communication skills Motivation for treatment/growth Supportive family/friends  Treatment Modalities: Medication Management, Group therapy,  Case management,  1 to 1 session with clinician, Psychoeducation, Recreational therapy.   Physician Treatment Plan for Primary Diagnosis: Schizoaffective disorder, depressive type (HCC) Long Term Goal(s): Improvement in symptoms so as ready for discharge NA   Short Term Goals: Ability to identify changes in lifestyle to reduce recurrence of condition will improve Ability to verbalize feelings will improve Ability to disclose and discuss suicidal ideas Ability to demonstrate self-control will improve Ability to identify and develop effective coping behaviors will improve Ability to maintain clinical measurements within normal limits will improve Ability to identify triggers associated with substance abuse/mental health issues will improve NA  Medication Management: Evaluate patient's response, side effects, and tolerance of medication regimen.  Therapeutic Interventions: 1 to 1 sessions, Unit Group sessions and Medication administration.  Evaluation of Outcomes: Progressing  Physician Treatment Plan for Secondary Diagnosis: Principal Problem:   Schizoaffective disorder, depressive type (HCC) Active Problems:   HTN (hypertension)   Dyslipidemia   Diabetes (HCC)   Tobacco use disorder  Long Term Goal(s): Improvement in symptoms so as ready for discharge NA   Short Term Goals: Ability to identify changes in lifestyle to reduce recurrence of condition will improve Ability to verbalize feelings will improve Ability to disclose and discuss suicidal ideas Ability to demonstrate self-control will improve Ability to identify and develop effective coping behaviors will improve Ability to maintain clinical measurements within normal limits will improve Ability to identify triggers associated with substance abuse/mental health issues will improve NA     Medication Management: Evaluate patient's response, side effects, and tolerance of medication regimen.  Therapeutic Interventions: 1  to 1 sessions, Unit Group sessions and Medication administration.  Evaluation of Outcomes: Progressing   RN Treatment Plan for Primary Diagnosis: Schizoaffective disorder, depressive type (HCC) Long Term Goal(s): Knowledge of disease and therapeutic regimen to maintain health will improve  Short Term Goals: Ability to verbalize feelings will improve, Ability to identify and develop effective coping behaviors will improve and Compliance with prescribed medications will improve  Medication Management: RN will administer medications as ordered by provider, will assess and evaluate patient's response and provide education to patient for prescribed medication. RN will report any adverse and/or side effects to prescribing provider.  Therapeutic Interventions: 1 on 1 counseling sessions, Psychoeducation, Medication administration, Evaluate responses to treatment, Monitor vital signs and CBGs as ordered, Perform/monitor CIWA, COWS, AIMS and Fall Risk screenings as ordered, Perform wound care treatments as ordered.  Evaluation of Outcomes: Progressing   LCSW Treatment Plan for Primary Diagnosis: Schizoaffective disorder, depressive type (HCC) Long Term Goal(s): Safe  transition to appropriate next level of care at discharge, Engage patient in therapeutic group addressing interpersonal concerns.  Short Term Goals: Engage patient in aftercare planning with referrals and resources, Identify triggers associated with mental health/substance abuse issues and Increase skills for wellness and recovery  Therapeutic Interventions: Assess for all discharge needs, 1 to 1 time with Social worker, Explore available resources and support systems, Assess for adequacy in community support network, Educate family and significant other(s) on suicide prevention, Complete Psychosocial Assessment, Interpersonal group therapy.  Evaluation of Outcomes: Progressing   Progress in Treatment: Attending groups:  Yes. Participating in groups: Yes. Taking medication as prescribed: Yes. Toleration medication: Yes. Family/Significant other contact made: Yes, individual(s) contacted:  attempted contact with pt's mother.  Patient understands diagnosis: Yes. Discussing patient identified problems/goals with staff: Yes. Medical problems stabilized or resolved: Yes. Denies suicidal/homicidal ideation: Yes. Issues/concerns per patient self-inventory: No. Other: None at this time.   New problem(s) identified: No, Describe:  None at this time.  New Short Term/Long Term Goal(s): Pt reported his goal for treatment is to, "be as one with everything around me as I can be. That keeps me calm."   Discharge Plan or Barriers: CSW will continue to assess for appropriate discharge plan. Pt will be discharged to his mother's home and will continue treatment in the outpatient setting upon discharge from [facility].   Reason for Continuation of Hospitalization: Depression Hallucinations Medication stabilization  Estimated Length of Stay: 3-5 days   Recreational Therapy: Patient Stressors: Past history Patient Goal: Patient will identify 3 positive coping skills to decrease depressive symptoms x5 days.   Attendees: Patient:  07/28/2017 11:23 AM  Physician: Dr. Johnella Moloney, MD 07/28/2017 11:23 AM  Nursing: Leonia Reader, RN 07/28/2017 11:23 AM  RN Care Manager: 07/28/2017 11:23 AM  Social Worker: Heidi Dach, LCSW 07/28/2017 11:23 AM  Recreational Therapist: Garret Reddish, CTRS-LRT 07/28/2017 11:23 AM  Other: Johny Shears, LCSWA 07/28/2017 11:23 AM  Other:  07/28/2017 11:23 AM  Other: 07/28/2017 11:23 AM    Scribe for Treatment Team: Heidi Dach, LCSW 07/28/2017 11:23 AM

## 2017-07-28 NOTE — Plan of Care (Signed)
Affect brighter.  Denies SI/HI/AVH.  Denies any depression.  Verbalizes that he feels great.  Medication and group compliant.  Visible in the milieu.  Interacting with peers and staff appropriately.

## 2017-07-28 NOTE — Plan of Care (Signed)
Patient slept for Estimated Hours of 8; Precautionary checks every 15 minutes for safety maintained, room free of safety hazards, patient sustains no injury or falls during this shift.  

## 2017-07-28 NOTE — Progress Notes (Addendum)
[facility] - [place] [facility]   PATIENT NAME: Henry Shaffer    MR#:  [id number]  DATE OF BIRTH:  06-Apr-1976  SUBJECTIVE:  CHIEF COMPLAINT:  No chief complaint on file.   Admitted with psychotic behavior, medical consult called in for external ear infection and cellulitis. Patient feels better today.  REVIEW OF SYSTEMS:  CONSTITUTIONAL: No fever, fatigue or weakness.  EYES: No blurred or double vision.  EARS, NOSE, AND THROAT: No tinnitus or ear pain.  RESPIRATORY: No cough, shortness of breath, wheezing or hemoptysis.  CARDIOVASCULAR: No chest pain, orthopnea, edema.  GASTROINTESTINAL: No nausea, vomiting, diarrhea or abdominal pain.  GENITOURINARY: No dysuria, hematuria.  ENDOCRINE: No polyuria, nocturia,  HEMATOLOGY: No anemia, easy bruising or bleeding SKIN: No rash or lesion. MUSCULOSKELETAL: No joint pain or arthritis.   NEUROLOGIC: No tingling, numbness, weakness.  PSYCHIATRY: No anxiety or depression.   ROS  DRUG ALLERGIES:   Allergies  Allergen Reactions  . Codeine     Unknown. Pt's father reported this in the past, but pt doesn't know his reaction.  . Erythromycin Other (See Comments)    Unknown. Pt's father reported this in the past, but pt doesn't know his reaction.  . Sulfa Antibiotics     Unknown. Pt's father reported this in the past, but pt doesn't know his reaction.    VITALS:  Blood pressure (!) 141/96, pulse 86, temperature 97.7 F (36.5 C), temperature source Oral, resp. rate 18, height 5\' 10"  (1.778 m), weight 99.8 kg (220 lb), SpO2 97 %.  PHYSICAL EXAMINATION:  GENERAL:  42 y.o.-year-old patient lying in the bed with no acute distress.  EYES: Pupils equal, round, reactive to light and accommodation. No scleral icterus. Extraocular muscles intact.  HEENT: Head atraumatic, normocephalic. Oropharynx and nasopharynx clear. Left external ear on posterior side has redness and swelling, also has some redness on his scalp in nearby  area. No fluctuating swelling. NECK:  Supple, no jugular venous distention. No thyroid enlargement, no tenderness.  LUNGS: Normal breath sounds bilaterally, no wheezing, rales,rhonchi or crepitation. No use of accessory muscles of respiration.  CARDIOVASCULAR: S1, S2 normal. No murmurs, rubs, or gallops.  ABDOMEN: Soft, nontender, nondistended. Bowel sounds present. No organomegaly or mass.  EXTREMITIES: No pedal edema, cyanosis, or clubbing.  NEUROLOGIC: Cranial nerves II through XII are intact. Muscle strength 5/5 in all extremities. Sensation intact. Gait not checked.  PSYCHIATRIC: The patient is alert and oriented x 3.  SKIN: No obvious rash, lesion, or ulcer.   Physical Exam LABORATORY PANEL:   CBC Recent Labs  Lab 07/21/17 1835  WBC 10.7*  HGB 14.3  HCT 42.5  PLT 254   ------------------------------------------------------------------------------------------------------------------  Chemistries  Recent Labs  Lab 07/21/17 1835  NA 134*  K 3.6  CL 104  CO2 22  GLUCOSE 131*  BUN 15  CREATININE 1.28*  CALCIUM 8.9  AST 25  ALT 22  ALKPHOS 73  BILITOT 1.0   ------------------------------------------------------------------------------------------------------------------  Cardiac Enzymes No results for input(s): TROPONINI in the last 168 hours. ------------------------------------------------------------------------------------------------------------------  RADIOLOGY:  No results found.  ASSESSMENT AND PLAN:   Principal Problem:   Schizoaffective disorder, depressive type (HCC) Active Problems:   HTN (hypertension)   Dyslipidemia   Diabetes (HCC)   Tobacco use disorder   Henry Shaffer  is a 42 y.o. male with a known history of paranoid schizophrenia with depression and anxiety, hypertension, diabetes mellitus and hyperlipidemia presents from home secondary to disorganized and bizarre behavior. Medical consult requested for  rash on the scalp.  1. Scalp  cellulitis- from behind the right ear and likely from a scalp wound - does not look like shingles -  doxycycline as patient has allergies to sulfa antibiotics - Pain control and monitor. No fluctuant area noted. Will hold off on imaging studies at this time. - Advise to give doxy for total 10 days then stop.  2. Diabetes mellitus-A1c of 7.5 - on metformin  3. HTN- on lisinopril and norvasc  4. Schizophrenia with depression- management per psych - Continue Depakote, Luvox, invega and risperidone  5. Tobacco use disorder-on nicotine patch   Counseled to quit smoking for 4 min.  6. DVT prophylaxis-patient is ambulatory  As pt is stable, will sign off, please call with further questions. Please provide him prescription of Doxycycline 100 mg BID on discharge- the course of total treatment should be 10 days, so count remaining days on discharge.  All the records are reviewed and case discussed with Care Management/Social Workerr. Management plans discussed with the patient, family and they are in agreement.  CODE STATUS: Full.  TOTAL TIME TAKING CARE OF THIS PATIENT: 35 minutes.     POSSIBLE D/C IN 2-3 DAYS, DEPENDING ON CLINICAL CONDITION.   Altamese Dilling M.D on 07/28/2017   Between 7am to 6pm - Pager - [phone]  After 6pm go to [email or web address] - Social research officer, government  [facility]  Office  [phone]  CC: Primary care physician; Anselm Jungling, MD  Note: This dictation was prepared with Dragon dictation along with smaller phrase technology. Any transcriptional errors that result from this process are unintentional.

## 2017-07-28 NOTE — Plan of Care (Signed)
Patient oriented to unit. Patient denies SI/HI. Patient's safety is maintained on unit.   Progressing Education: Knowledge of [facility] Education information/materials will improve 07/28/2017 2009 - Progressing by Addison Naegelieynolds, Harshil Cavallaro I, RN Safety: Periods of time without injury will increase 07/28/2017 2009 - Progressing by Berkley Harveyeynolds, Tauna Macfarlane I, RN

## 2017-07-28 NOTE — Progress Notes (Signed)

## 2017-07-28 NOTE — [facility] Group Notes (Signed)
[facility] Group Notes:  (Nursing/MHT/Case Management/Adjunct)  Date:  07/28/2017  Time:  9:28 PM  Type of Therapy:  Group Therapy  Participation Level:  Active  Participation Quality:  Appropriate  Affect:  Appropriate  Cognitive:  Appropriate  Insight:  Good  Engagement in Group:  Engaged  Modes of Intervention:  Support  Summary of Progress/Problems:  Henry NeerJackie L Virgel Shaffer 07/28/2017, 9:28 PM

## 2017-07-28 NOTE — Progress Notes (Signed)
Recreation Therapy Notes  Date: 01.04.2019  Time: 9:30 am  Location: Craft Room  Behavioral response: Appropriate  Intervention Topic: Communication  Discussion/Intervention: Group content today was focused on communication. The group defined communication and ways to communicate with others. Individuals stated reason why communication is important and some reasons to communicate with others. Patients expressed if they thought they were good at communicating with others and ways they could improve their communication skills. The group identified important parts of communication and some experiences they have had in the past with communication. The group participated in the intervention "Words in a Bag", where they had a chance to test out their communication skills and identify ways to improve their communication techniques.  Clinical Observations/Feedback:  Patient came to group and stated communication can happen through facial expressions. He stated some positive ways to communicate is staying calm, being nice and having a positive tone. Individual describe giving others the middle finger and a negative way to communicate. Patient explained that it is important to communicate with others to express your needs to others. Individual continues to make progress towards his goals. Chrislynn Mosely LRT/CTRS         Jamielynn Wigley 07/28/2017 10:24 AM

## 2017-07-28 NOTE — [facility] Group Notes (Signed)
[facility] Group Notes:  (Nursing/MHT/Case Management/Adjunct)  Date:  07/28/2017  Time:  5:09 PM  Type of Therapy:  Psychoeducational Skills  Participation Level:  Did Not Attend  Secilia Apps 07/28/2017, 5:09 PM  

## 2017-07-28 NOTE — Progress Notes (Signed)
[facility] MD Progress Note  07/28/2017 1:40 PM Henry Shaffer  MRN:  [id number]   Subjective:  Pt states that he is in a lot of pain behind his ear. Hospitalist saw him and determined that it is cellulitis and is treating with antibiotics. Pt appears to be in a lot of pain. He states that overall he is feeling better. He is sleeping well. He denies AH, VH, SI or HI. He is tolerating INvega injection. HE is organized and goal directed and has been very calm and pleasant on the unit.   Principal Problem: Schizoaffective disorder, depressive type (HCC) Diagnosis:   Patient Active Problem List   Diagnosis Date Noted  . Schizoaffective disorder, depressive type (HCC) [F25.1] 07/22/2017    Priority: High  . Asthma [J45.909] 02/14/2017  . HTN (hypertension) [I10] 02/13/2017  . Dyslipidemia [E78.5] 02/13/2017  . Diabetes (HCC) [E11.9] 02/13/2017  . Tobacco use disorder [F17.200] 02/13/2017  . Schizoaffective disorder, bipolar type (HCC) [F25.0] 06/29/2012  . Decreased vision [H54.7] 05/08/2012  . 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 Anxiety   . Depression   . Diabetes (HCC)   . Hyperlipidemia   . Hypertension   . Schizophrenia [facility])     Past Surgical History:  Procedure Laterality Date  . CARDIAC CATHETERIZATION     Family History:  Family History  Problem Relation Age of Onset  . Diabetes Mother   . CAD Mother   . Heart failure Father   . Diabetes Father    Family Psychiatric  History: See H&P Social History:  Social History   Substance and Sexual Activity  Alcohol Use Yes  . Alcohol/week: 0.6 oz  . Types: 1 Cans of beer per week   Comment: occassionally     Social History   Substance and Sexual Activity  Drug Use Yes  . Types: Marijuana   Comment: liquid marijuana 2 weeks ago    Social History    Socioeconomic History  . Marital status: Single    Spouse name: None  . Number of children: None  . Years of education: None  . Highest education level: None  Social Needs  . Financial resource strain: None  . Food insecurity - worry: None  . Food insecurity - inability: None  . Transportation needs - medical: None  . Transportation needs - non-medical: None  Occupational History  . None  Tobacco Use  . Smoking status: Current Every Day Smoker    Packs/day: 2.00  . Smokeless tobacco: Never Used  Substance and Sexual Activity  . Alcohol use: Yes    Alcohol/week: 0.6 oz    Types: 1 Cans of beer per week    Comment: occassionally  . Drug use: Yes    Types: Marijuana    Comment: liquid marijuana 2 weeks ago  . Sexual activity: No  Other Topics Concern  . None  Social History Narrative   Independent at baseline   Additional Social History:                         Sleep: Good  Appetite:  Good  Current Medications: Current Facility-Administered Medications  Medication Dose Route Frequency Provider Last Rate Last Dose  . albuterol (PROVENTIL HFA;VENTOLIN HFA) 108 (90 Base) MCG/ACT inhaler 2 puff  2 puff Inhalation Q6H Pucilowska, Jolanta  B, MD   2 puff at 07/28/17 1231  . alum & mag hydroxide-simeth (MAALOX/MYLANTA) 200-200-20 MG/5ML suspension 30 mL  30 mL Oral Q4H PRN Clapacs, John T, MD      . amLODipine (NORVASC) tablet 10 mg  10 mg Oral Daily Clapacs, Jackquline DenmarkJohn T, MD   10 mg at 07/28/17 0819  . aspirin EC tablet 81 mg  81 mg Oral Daily Clapacs, Jackquline DenmarkJohn T, MD   81 mg at 07/28/17 0819  . atorvastatin (LIPITOR) tablet 80 mg  80 mg Oral q1800 Clapacs, Jackquline DenmarkJohn T, MD   80 mg at 07/27/17 1732  . cloNIDine (CATAPRES) tablet 0.1 mg  0.1 mg Oral Q6H PRN Osten Janek R, MD      . divalproex (DEPAKOTE ER) 24 hr tablet 1,000 mg  1,000 mg Oral BID AC & HS Pucilowska, Jolanta B, MD   1,000 mg at 07/28/17 0819  . doxycycline (VIBRA-TABS) tablet 100 mg  100 mg Oral Q12H Enid BaasKalisetti,  Radhika, MD   100 mg at 07/28/17 0819  . fluvoxaMINE (LUVOX) tablet 50 mg  50 mg Oral QHS Pucilowska, Jolanta B, MD   50 mg at 07/27/17 2130  . hydrOXYzine (ATARAX/VISTARIL) tablet 50 mg  50 mg Oral TID PRN Clapacs, Jackquline DenmarkJohn T, MD   50 mg at 07/28/17 1012  . ibuprofen (ADVIL,MOTRIN) tablet 600 mg  600 mg Oral Q4H PRN Brigetta Beckstrom, Ileene HutchinsonHolly R, MD   600 mg at 07/28/17 1012  . lisinopril (PRINIVIL,ZESTRIL) tablet 40 mg  40 mg Oral Daily Clapacs, Jackquline DenmarkJohn T, MD   40 mg at 07/28/17 0819  . magnesium hydroxide (MILK OF MAGNESIA) suspension 30 mL  30 mL Oral Daily PRN Clapacs, John T, MD      . metFORMIN (GLUCOPHAGE) tablet 1,000 mg  1,000 mg Oral BID WC Clapacs, John T, MD   1,000 mg at 07/28/17 0819  . nicotine (NICODERM CQ - dosed in mg/24 hours) patch 21 mg  21 mg Transdermal Daily Clapacs, Jackquline DenmarkJohn T, MD   21 mg at 07/28/17 0819  . [START ON 07/31/2017] paliperidone (INVEGA SUSTENNA) injection 156 mg  156 mg Intramuscular Once Perrin Gens, Ileene HutchinsonHolly R, MD      . traZODone (DESYREL) tablet 50 mg  50 mg Oral QHS PRN Joanathan Affeldt, Ileene HutchinsonHolly R, MD        Lab Results: No results found for this or any previous visit (from the past 48 hour(s)).  Blood Alcohol level:  Lab Results  Component Value Date   ETH <10 07/21/2017   ETH <5 02/11/2017    Metabolic Disorder Labs: Lab Results  Component Value Date   HGBA1C 7.5 (H) 07/22/2017   MPG 168.55 07/22/2017   MPG 209 02/15/2017   No results found for: PROLACTIN Lab Results  Component Value Date   CHOL 68 07/22/2017   TRIG 88 07/22/2017   HDL 27 (L) 07/22/2017   CHOLHDL 2.5 07/22/2017   VLDL 18 07/22/2017   LDLCALC 23 07/22/2017   LDLCALC 26 02/15/2017    Physical Findings: AIMS: Facial and Oral Movements Muscles of Facial Expression: None, normal Lips and Perioral Area: None, normal Jaw: None, normal Tongue: None, normal,Extremity Movements Upper (arms, wrists, hands, fingers): None, normal Lower (legs, knees, ankles, toes): None, normal, Trunk Movements Neck, shoulders, hips:  None, normal, Overall Severity Severity of abnormal movements (highest score from questions above): None, normal Incapacitation due to abnormal movements: None, normal Patient's awareness of abnormal movements (rate only patient's report): No Awareness, Dental Status Current problems with teeth and/or dentures?:  No Does patient usually wear dentures?: No  CIWA:  CIWA-Ar Total: 1 COWS:  COWS Total Score: 1  Musculoskeletal: Strength & Muscle Tone: within normal limits Gait & Station: normal Patient leans: N?A  Psychiatric Specialty Exam: Physical Exam  Nursing note and vitals reviewed. Skin: Rash noted.  edema around jaw,  p  ROS Pain around ear and jaw  Review of Systems  All other systems reviewed and are negative.   Blood pressure (!) 141/96, pulse 86, temperature 97.7 F (36.5 C), temperature source Oral, resp. rate 18, height 5\' 10"  (1.778 m), weight 99.8 kg (220 lb), SpO2 97 %.Body mass index is 31.57 kg/m.  General Appearance: Casual  Eye Contact:  Minimal  Speech:  Clear and Coherent  Volume:  Normal  Mood:  Euthymic  Affect:  Congruent  Thought Process:  Coherent and Goal Directed  Orientation:  Full (Time, Place, and Person)  Thought Content:  Negative  Suicidal Thoughts:  No  Homicidal Thoughts:  No  Memory:  Fair  Judgement:  Fair  Insight:  Fair  Psychomotor Activity:  Normal  Concentration:  Concentration: Fair  Recall:  Fair  Fund of Knowledge:  Fair  Language:  Fair  Akathisia:  No      Assets:  Communication Skills Desire for Improvement Housing Social Support Talents/Skills  ADL's:  Intact  Cognition:  WNL  Sleep:  Number of Hours: 8     Treatment Plan Summary: 42 yo male admitted after psychotic and manic episode. He has been doing really well. HE is due for second INvega injection on Monday.  Plan:  Mood and Psychosis -Second INvega Injection scheduled for Monday -Continue Depakote 1000 mg BID. Depakote level was not drawn. Will  reorder  Anxiety -Continue Luvox 50 mg daily  DM -Continue Metformin 1000 mg BID  HTN -BP better today -Continue prn Clonidine  Ear Cellulitis _IM following -He was started on doxycycline for 7-10 days  Dispo -He will return home with his mother on discharge. Likely discharge on Tuesday   Haskell Riling, MD 07/28/2017, 1:40 PM

## 2017-07-29 LAB — GLUCOSE, CAPILLARY
Glucose-Capillary: 252 mg/dL — ABNORMAL HIGH (ref 65–99)
Glucose-Capillary: 232 mg/dL — ABNORMAL HIGH (ref 65–99)

## 2017-07-29 LAB — VALPROIC ACID LEVEL: Valproic Acid Lvl: 90 ug/mL (ref 50.0–100.0)

## 2017-07-29 MED ORDER — INSULIN ASPART 100 UNIT/ML ~~LOC~~ SOLN
0.0000 [IU] | Freq: Three times a day (TID) | SUBCUTANEOUS | Status: DC
  Administered 2017-07-29: 5 [IU] via SUBCUTANEOUS
  Administered 2017-07-29: 3 [IU] via SUBCUTANEOUS
  Administered 2017-07-30 (×2): 2 [IU] via SUBCUTANEOUS
  Administered 2017-07-31: 3 [IU] via SUBCUTANEOUS
  Administered 2017-07-31: 5 [IU] via SUBCUTANEOUS
  Administered 2017-07-31 – 2017-08-01 (×2): 3 [IU] via SUBCUTANEOUS
  Filled 2017-07-29 (×3): qty 1

## 2017-07-29 NOTE — Progress Notes (Signed)
D- Patient alert and oriented. Patient presents in a pleasant mood on assessment stating that he slept "so-so because of my pain" Patient rates his pain level a "6/10" in his head/neck area where he has a rash and patient did request medication from this Clinical research associatewriter. Patient states that he doesn't have much anxiety right now, but he rates it as a "2/10" because of his pain. Patient denies SI, HI, AVH, as well as depression at this time. Patient's goal for today is "outpatient and group", which he will accomplish by "go to group".  A- Scheduled medications administered to patient, per MD orders. Support and encouragement provided.  Routine safety checks conducted every 15 minutes.  Patient informed to notify staff with problems or concerns.  R- No adverse drug reactions noted. Patient contracts for safety at this time. Patient compliant with medications and treatment plan. Patient receptive, calm, and cooperative. Patient interacts well with others on the unit.  Patient remains safe at this time.

## 2017-07-29 NOTE — Plan of Care (Signed)
Patient verbalizes understanding of general information provided to him without any further questions or concerns at this time. Patient denies SI/HI/AVH as well as any signs/symptoms of depression at this time. Patient has been free from injury thus far on the unit. Patient verbalizes understanding of his prescribed therapeutic regimen and has been in compliance thus far without any further questions or concerns at this time. Patient has shown interest in leisure activities on the unit. Patient was observed going outside to the courtyard to get some fresh air. Patient states that he slept "so-so" last night because "I was in pain". Patient has been observed reading the Bible in his room throughout the day, which appears to be helping the patient cope. Patient has been participating in unit groups thus far. Patient is safe on the unit at this time.
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
## 2017-07-29 NOTE — [facility] Group Notes (Signed)
LCSW Group Therapy Note   07/29/2017 1:15pm   Type of Therapy and Topic:  Group Therapy:  Trust and Honesty  Participation Level:  Did Not Attend  Description of Group:    In this group patients will be asked to explore the value of being honest.  Patients will be guided to discuss their thoughts, feelings, and behaviors related to honesty and trusting in others. Patients will process together how trust and honesty relate to forming relationships with peers, family members, and self. Each patient will be challenged to identify and express feelings of being vulnerable. Patients will discuss reasons why people are dishonest and identify alternative outcomes if one was truthful (to self or others). This group will be process-oriented, with patients participating in exploration of their own experiences, giving and receiving support, and processing challenge from other group members.   Therapeutic Goals: 1. Patient will identify why honesty is important to relationships and how honesty overall affects relationships.  2. Patient will identify a situation where they lied or were lied too and the  feelings, thought process, and behaviors surrounding the situation 3. Patient will identify the meaning of being vulnerable, how that feels, and how that correlates to being honest with self and others. 4. Patient will identify situations where they could have told the truth, but instead lied and explain reasons of dishonesty.   Summary of Patient Progress    Therapeutic Modalities:   Cognitive Behavioral Therapy Solution Focused Therapy Motivational Interviewing Brief Therapy  Shreyas Piatkowski  CUEBAS-COLON, LCSW 07/29/2017 9:38 AM  

## 2017-07-30 LAB — GLUCOSE, CAPILLARY
GLUCOSE-CAPILLARY: 180 mg/dL — AB (ref 65–99)
GLUCOSE-CAPILLARY: 197 mg/dL — AB (ref 65–99)
Glucose-Capillary: 214 mg/dL — ABNORMAL HIGH (ref 65–99)

## 2017-07-30 NOTE — Progress Notes (Addendum)
Patient found in room, having a BM upon my arrival. Patient is cooperative and friendly throughout assessment. Patient is talkative and expresses happiness regarding impending discharge Tuesday. Patient reports he and his mother live together and are supportive of each other. States, "I miss her." Reports pain 7/10 from wound to left scalp. Wound is clean, red, and dry upon inspection. Given Ibuprofen with relief reported. Patient's BP is elevated, given Clonidine with positive results. Given Trazodone for sleep with positive results. Reports eating and voiding adequately. CBG 214, no coverage ordered for HS. Compliant with HS medications. Visible and social throughout the evening. Reports attending all groups as well as wrapup group tonight. Ate 100% of snack this evening. Q 15 minute checks maintained. Will continue to monitor throughout the shift.  Patient reports sleeping adequately. Complains of scalp pain 5/10, given ibuprofen with positive results. Will endorse care to oncoming shift.

## 2017-07-30 NOTE — Progress Notes (Addendum)
Patient ID: Henry Shaffer, male   DOB: July 20, 1976, 42 y.o.   MRN: [id number]   D: Pt has been very flat and depressed on the unit today. Pt remained in the bed most of the day, reported that he was just tired. Pt did complain of some pain and anxiety this morning, medication was given pt reported that medication helped. Pt took all medication without any problems. Pt remains very depressed and hopeless. Pt reported being negative SI/HI, no AH/VH noted. A: 15 min checks continued for patient safety. R: Pt safety maintained.

## 2017-07-30 NOTE — Progress Notes (Signed)
Patient is calm and cooperative, compliance with his medications, patient participate regularly in scheduled activities, patient have instances of impulsive behaviors. Poor night sleep is described as not continuous and not completely certified. Patient report feeling  apprehensive but endorses for safety of self and others, support and encouragement is rendered as patient remain safe without harm in the unit.

## 2017-07-30 NOTE — [facility] Group Notes (Signed)
LCSW Group Therapy Note 07/30/2017 1:15pm  Type of Therapy and Topic: Group Therapy: Feelings Around Returning Home & Establishing a Supportive Framework and Supporting Oneself When Supports Not Available  Participation Level: Active  Description of Group:  Patients first processed thoughts and feelings about upcoming discharge. These included fears of upcoming changes, lack of change, new living environments, judgements and expectations from others and overall stigma of mental health issues. The group then discussed the definition of a supportive framework, what that looks and feels like, and how do to discern it from an unhealthy non-supportive network. The group identified different types of supports as well as what to do when your family/friends are less than helpful or unavailable  Therapeutic Goals  1. Patient will identify one healthy supportive network that they can use at discharge. 2. Patient will identify one factor of a supportive framework and how to tell it from an unhealthy network. 3. Patient able to identify one coping skill to use when they do not have positive supports from others. 4. Patient will demonstrate ability to communicate their needs through discussion and/or role plays.  Summary of Patient Progress:  Pt scored his mood a 10 (10 best). Pt engaged during group session. As patients processed their anxiety about discharge and described healthy supports patient shared he is excited about going back home. Patients identified at least one self-care tool they were willing to use after discharge.   Therapeutic Modalities Cognitive Behavioral Therapy Motivational Interviewing   Jeannifer Drakeford  CUEBAS-COLON, LCSW 07/30/2017 9:59 AM

## 2017-07-30 NOTE — [facility] Group Notes (Signed)
[facility] Group Notes:  (Nursing/MHT/Case Management/Adjunct)  Date:  07/30/2017  Time:  11:13 PM  Type of Therapy:  Group Therapy  Participation Level:  Active  Participation Quality:  Appropriate  Affect:  Appropriate  Cognitive:  Appropriate  Insight:  Appropriate  Engagement in Group:  Engaged  Modes of Intervention:  Discussion  Summary of Progress/Problems:  Henry Shaffer 07/30/2017, 11:13 PM

## 2017-07-30 NOTE — Plan of Care (Signed)
  Progressing Education: Knowledge of [facility] Education information/materials will improve 07/30/2017 2333 - Progressing by [name], Afsana Liera E, RN Emotional status will improve 07/30/2017 2333 - Progressing by [name], Gable Odonohue E, RN Mental status will improve 07/30/2017 2333 - Progressing by [name], Eliezer Khawaja E, RN Verbalization of understanding the information provided will improve 07/30/2017 2333 - Progressing by [name], Dajane Valli E, RN Safety: Periods of time without injury will increase 07/30/2017 2333 - Progressing by [name], Syncere Eble E, RN Education: Will be free of psychotic symptoms 07/30/2017 2333 - Progressing by [name], Kenniyah Sasaki E, RN Knowledge of the prescribed therapeutic regimen will improve 07/30/2017 2333 - Progressing by [name], Rachel Rison E, RN Activity: Interest or engagement in leisure activities will improve 07/30/2017 2333 - Progressing by [name], Jamaya Sleeth E, RN Imbalance in normal sleep/wake cycle will improve 07/30/2017 2333 - Progressing by [name], Shlok Raz E, RN Education: Utilization of techniques to improve thought processes will improve 07/30/2017 2333 - Progressing by [name], Dameshia Seybold E, RN Knowledge of the prescribed therapeutic regimen will improve 07/30/2017 2333 - Progressing by [name], Nayeli Calvert E, RN Coping: Ability to cope will improve 07/30/2017 2333 - Progressing by [name], Maely Clements E, RN Ability to verbalize feelings will improve 07/30/2017 2333 - Progressing by [name], Shonna Deiter E, RN [facility] in Recreation Therapeutic Interventions STG-Other Recreation Therapy Goal (Specify) Description Patient will identify 3 positive coping skills to decrease depressive symptoms x5 days.   07/30/2017 2333 - Progressing by [name], Dvon Jiles E, RN Activity: Sleeping patterns will improve 07/30/2017 2333 - Progressing by [name], Lety Cullens E, RN
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
## 2017-07-31 LAB — GLUCOSE, CAPILLARY
GLUCOSE-CAPILLARY: 175 mg/dL — AB (ref 65–99)
Glucose-Capillary: 239 mg/dL — ABNORMAL HIGH (ref 65–99)
Glucose-Capillary: 261 mg/dL — ABNORMAL HIGH (ref 65–99)
Glucose-Capillary: 243 mg/dL — ABNORMAL HIGH (ref 65–99)

## 2017-07-31 MED ORDER — PALIPERIDONE PALMITATE 156 MG/ML IM SUSP: 156.0000 mg | Freq: Once | INTRAMUSCULAR | 0 refills | Status: DC

## 2017-07-31 MED ORDER — TRAZODONE HCL 50 MG PO TABS: 50.0000 mg | ORAL_TABLET | Freq: Every evening | ORAL | 0 refills | Status: DC | PRN

## 2017-07-31 MED ORDER — LISINOPRIL 20 MG PO TABS: 40.0000 mg | ORAL_TABLET | Freq: Every day | ORAL | 0 refills | Status: DC

## 2017-07-31 MED ORDER — FLUVOXAMINE MALEATE 50 MG PO TABS: 50.0000 mg | ORAL_TABLET | Freq: Every day | ORAL | 0 refills | Status: DC

## 2017-07-31 MED ORDER — DOXYCYCLINE HYCLATE 100 MG PO TABS: 100.0000 mg | ORAL_TABLET | Freq: Two times a day (BID) | ORAL | 0 refills | Status: AC

## 2017-07-31 MED ORDER — METFORMIN HCL 1000 MG PO TABS: 1000.0000 mg | ORAL_TABLET | Freq: Two times a day (BID) | ORAL | 0 refills | Status: DC

## 2017-07-31 MED ORDER — AMLODIPINE BESYLATE 10 MG PO TABS: 10.0000 mg | ORAL_TABLET | Freq: Every day | ORAL | 0 refills | Status: DC

## 2017-07-31 MED ORDER — DIVALPROEX SODIUM ER 500 MG PO TB24: 1000.0000 mg | ORAL_TABLET | Freq: Two times a day (BID) | ORAL | 0 refills | Status: DC

## 2017-07-31 MED ORDER — ATORVASTATIN CALCIUM 80 MG PO TABS: 80.0000 mg | ORAL_TABLET | Freq: Every day | ORAL | 0 refills | Status: DC

## 2017-07-31 MED ORDER — PALIPERIDONE PALMITATE 156 MG/ML IM SUSP: 156.0000 mg | Freq: Once | INTRAMUSCULAR | Status: DC

## 2017-07-31 NOTE — Plan of Care (Signed)
Pleasant and cooperative.  Denies SI/HI/AVH.  Denies any depression.  Verbalized that he feels a little sleepy this am.  Visible in the milieu. Interacts appropriately with peers and staff.  Medication compliant.  Verbalizes feelings and concerns without any difficulty.  Attends group.  Safety rounds maintained for safety.  Support and encouragement offered.

## 2017-07-31 NOTE — Progress Notes (Signed)
Recreation Therapy Notes  Date: 01.07.2019  Time: 9:30 am  Location: Craft Room  Behavioral response: Appropriate  Intervention Topic: Problem Solving  Discussion/Intervention: Group content on today was focused on problem solving. The group described what problem solving is. Patients expressed how problems affect them and how they deal with problems. Individuals identified healthy ways to deal with problems. Patients explained what normally happens to them when they do not deal with problems. The group expressed reoccurring problems for them. The group participated in the intervention "Put the story together" with their peers and worked together to put a story that was broken up in the correct order. Clinical Observations/Feedback:  Patient came to group and define problem solving as doing an analysis to figure out the right solution. Individual stated to the way he solves his problems is to look at the problem from different angles. He expressed that if he does not solve his problems it will drives him crazy. Patient participated in the intervention and continues to work on his goal.  Adult nursehaverence Humberto Addo LRT/CTRS         Mars Scheaffer 07/31/2017 11:22 AM

## 2017-07-31 NOTE — Progress Notes (Signed)
CSW contacted pt's brother, Derald MacleodRaymond Scheidt at [phone], to alert him of pt's discharge and ensure that he would be able to pick pt up tomorrow. Pt's brother reported he will pick pt up tomorrow, 08/01/17 at 1:00PM. CSW will follow up as needed.   Heidi DachKelsey Crystalina Stodghill, MSW, LCSW 07/31/2017 9:41 AM

## 2017-07-31 NOTE — Progress Notes (Signed)
[facility] MD Progress Note  07/31/2017 2:15 PM Henry Shaffer  MRN:  [id number] Subjective:  Pt states that he is doing really well. Mood has improved. He is sleeping much better and eating well. His scalp infection is improving and pain is much better. He has been caring for his ADLs. He is organized and goal directed. He denies SI, HI, AH, VH> I spoke with his mother who feels he is doing much better and feels like he is back to his old self. She is very thankful for the care he received here.   Principal Problem: Schizoaffective disorder, depressive type (HCC) Diagnosis:   Patient Active Problem List   Diagnosis Date Noted  . Schizoaffective disorder, depressive type (HCC) [F25.1] 07/22/2017    Priority: High  . Asthma [J45.909] 02/14/2017  . HTN (hypertension) [I10] 02/13/2017  . Dyslipidemia [E78.5] 02/13/2017  . Diabetes (HCC) [E11.9] 02/13/2017  . Tobacco use disorder [F17.200] 02/13/2017  . Schizoaffective disorder, bipolar type (HCC) [F25.0] 06/29/2012  . Decreased vision [H54.7] 05/08/2012  . 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 Anxiety   . Depression   . Diabetes (HCC)   . Hyperlipidemia   . Hypertension   . Schizophrenia [facility])     Past Surgical History:  Procedure Laterality Date  . CARDIAC CATHETERIZATION     Family History:  Family History  Problem Relation Age of Onset  . Diabetes Mother   . CAD Mother   . Heart failure Father   . Diabetes Father    Family Psychiatric  History: See H&P Social History:  Social History   Substance and Sexual Activity  Alcohol Use Yes  . Alcohol/week: 0.6 oz  . Types: 1 Cans of beer per week   Comment: occassionally     Social History   Substance and Sexual Activity  Drug Use Yes  . Types: Marijuana   Comment: liquid marijuana 2 weeks ago    Social  History   Socioeconomic History  . Marital status: Single    Spouse name: None  . Number of children: None  . Years of education: None  . Highest education level: None  Social Needs  . Financial resource strain: None  . Food insecurity - worry: None  . Food insecurity - inability: None  . Transportation needs - medical: None  . Transportation needs - non-medical: None  Occupational History  . None  Tobacco Use  . Smoking status: Current Every Day Smoker    Packs/day: 2.00  . Smokeless tobacco: Never Used  Substance and Sexual Activity  . Alcohol use: Yes    Alcohol/week: 0.6 oz    Types: 1 Cans of beer per week    Comment: occassionally  . Drug use: Yes    Types: Marijuana    Comment: liquid marijuana 2 weeks ago  . Sexual activity: No  Other Topics Concern  . None  Social History Narrative   Independent at baseline   Additional Social History:                         Sleep: Good  Appetite:  Good  Current Medications: Current Facility-Administered Medications  Medication Dose Route Frequency Provider Last Rate Last Dose  . albuterol (PROVENTIL HFA;VENTOLIN HFA) 108 (90 Base) MCG/ACT inhaler 2 puff  2 puff Inhalation Q6H  Shari Prows, MD   2 puff at 07/31/17 0824  . alum & mag hydroxide-simeth (MAALOX/MYLANTA) 200-200-20 MG/5ML suspension 30 mL  30 mL Oral Q4H PRN Clapacs, Jackquline Denmark, MD   30 mL at 07/28/17 1759  . amLODipine (NORVASC) tablet 10 mg  10 mg Oral Daily Clapacs, Jackquline Denmark, MD   10 mg at 07/31/17 0825  . aspirin EC tablet 81 mg  81 mg Oral Daily Clapacs, Jackquline Denmark, MD   81 mg at 07/31/17 0825  . atorvastatin (LIPITOR) tablet 80 mg  80 mg Oral q1800 Clapacs, Jackquline Denmark, MD   80 mg at 07/30/17 1642  . cloNIDine (CATAPRES) tablet 0.1 mg  0.1 mg Oral Q6H PRN Haskell Riling, MD   0.1 mg at 07/30/17 2014  . divalproex (DEPAKOTE ER) 24 hr tablet 1,000 mg  1,000 mg Oral BID AC & HS Pucilowska, Jolanta B, MD   1,000 mg at 07/31/17 0825  . doxycycline  (VIBRA-TABS) tablet 100 mg  100 mg Oral Q12H Enid Baas, MD   100 mg at 07/31/17 0825  . fluvoxaMINE (LUVOX) tablet 50 mg  50 mg Oral QHS Pucilowska, Jolanta B, MD   50 mg at 07/30/17 2145  . hydrOXYzine (ATARAX/VISTARIL) tablet 50 mg  50 mg Oral TID PRN Clapacs, Jackquline Denmark, MD   50 mg at 07/30/17 1607  . ibuprofen (ADVIL,MOTRIN) tablet 600 mg  600 mg Oral Q4H PRN Haskell Riling, MD   600 mg at 07/31/17 1610  . insulin aspart (novoLOG) injection 0-9 Units  0-9 Units Subcutaneous TID WC Darliss Ridgel, MD   5 Units at 07/31/17 1213  . lisinopril (PRINIVIL,ZESTRIL) tablet 40 mg  40 mg Oral Daily Clapacs, Jackquline Denmark, MD   40 mg at 07/31/17 0825  . magnesium hydroxide (MILK OF MAGNESIA) suspension 30 mL  30 mL Oral Daily PRN Clapacs, John T, MD      . metFORMIN (GLUCOPHAGE) tablet 1,000 mg  1,000 mg Oral BID WC Clapacs, John T, MD   1,000 mg at 07/31/17 0825  . nicotine (NICODERM CQ - dosed in mg/24 hours) patch 21 mg  21 mg Transdermal Daily Clapacs, Jackquline Denmark, MD   21 mg at 07/31/17 0825  . traZODone (DESYREL) tablet 50 mg  50 mg Oral QHS PRN Trilby Way, Ileene Hutchinson, MD   50 mg at 07/30/17 2146    Lab Results:  Results for orders placed or performed during the hospital encounter of 07/22/17 (from the past 48 hour(s))  Glucose, capillary     Status: Abnormal   Collection Time: 07/29/17  5:11 PM  Result Value Ref Range   Glucose-Capillary 232 (H) 65 - 99 mg/dL  Glucose, capillary     Status: Abnormal   Collection Time: 07/30/17 11:16 AM  Result Value Ref Range   Glucose-Capillary 180 (H) 65 - 99 mg/dL  Glucose, capillary     Status: Abnormal   Collection Time: 07/30/17  4:06 PM  Result Value Ref Range   Glucose-Capillary 197 (H) 65 - 99 mg/dL  Glucose, capillary     Status: Abnormal   Collection Time: 07/30/17  8:09 PM  Result Value Ref Range   Glucose-Capillary 214 (H) 65 - 99 mg/dL   Comment 1 Notify RN   Glucose, capillary     Status: Abnormal   Collection Time: 07/31/17  7:05 AM  Result Value  Ref Range   Glucose-Capillary 239 (H) 65 - 99 mg/dL   Comment 1 Notify RN   Glucose, capillary  Status: Abnormal   Collection Time: 07/31/17 11:43 AM  Result Value Ref Range   Glucose-Capillary 261 (H) 65 - 99 mg/dL   Comment 1 Notify RN     Blood Alcohol level:  Lab Results  Component Value Date   ETH <10 07/21/2017   ETH <5 02/11/2017    Metabolic Disorder Labs: Lab Results  Component Value Date   HGBA1C 7.5 (H) 07/22/2017   MPG 168.55 07/22/2017   MPG 209 02/15/2017   No results found for: PROLACTIN Lab Results  Component Value Date   CHOL 68 07/22/2017   TRIG 88 07/22/2017   HDL 27 (L) 07/22/2017   CHOLHDL 2.5 07/22/2017   VLDL 18 07/22/2017   LDLCALC 23 07/22/2017   LDLCALC 26 02/15/2017    Physical Findings: AIMS: Facial and Oral Movements Muscles of Facial Expression: None, normal Lips and Perioral Area: None, normal Jaw: None, normal Tongue: None, normal,Extremity Movements Upper (arms, wrists, hands, fingers): None, normal Lower (legs, knees, ankles, toes): None, normal, Trunk Movements Neck, shoulders, hips: None, normal, Overall Severity Severity of abnormal movements (highest score from questions above): None, normal Incapacitation due to abnormal movements: None, normal Patient's awareness of abnormal movements (rate only patient's report): No Awareness, Dental Status Current problems with teeth and/or dentures?: No Does patient usually wear dentures?: No  CIWA:  CIWA-Ar Total: 1 COWS:  COWS Total Score: 1  Musculoskeletal: Strength & Muscle Tone: within normal limits Gait & Station: normal Patient leans: N/A  Psychiatric Specialty Exam: Physical Exam  Nursing note and vitals reviewed.   Review of Systems  All other systems reviewed and are negative.   Blood pressure (!) 142/84, pulse 91, temperature 98 F (36.7 C), temperature source Oral, resp. rate 20, height 5\' 10"  (1.778 m), weight 99.8 kg (220 lb), SpO2 98 %.Body mass index is  31.57 kg/m.  General Appearance: Casual  Eye Contact:  Good  Speech:  Clear and Coherent  Volume:  Normal  Mood:  Euthymic  Affect:  Congruent  Thought Process:  Coherent and Goal Directed  Orientation:  Full (Time, Place, and Person)  Thought Content:  Negative  Suicidal Thoughts:  No  Homicidal Thoughts:  No  Memory:  Immediate;   Good  Judgement:  Fair  Insight:  Fair  Psychomotor Activity:  Normal  Concentration:  Concentration: Good  Recall:  Good  Fund of Knowledge:  Good  Language:  Fair  Akathisia:  No      Assets:  Communication Skills Desire for Improvement Housing Resilience Social Support  ADL's:  Intact  Cognition:  WNL  Sleep:  Number of Hours: 7.5     Treatment Plan Summary: 42 yo male admitted due to mania and psychosis. Symptoms have improved significant and manic symptoms and psychosis has resolved. HE received 2nd injection of Invega today and tolerated this well. His mother feels he is back to baseline.  Schizoaffective disorder -Pt received second Invega injection today. Next due on 08/28/17 -Continue Depakote 1000 mg BID. Depakote level 90 -Continue Luvox for depression  DM -Continue Metformin 1000 mg BID  HTN -BP better overall but still elevated -Continue Norvasc 10 mg daily  HLD _Continue Lipitor 80 mg daily  Ear Cellulitis -Continue Doxycycline for total of 10 days  Dispo -He will return home with his mother. I spoke with his mother today and she feels safe with him discharging tomorrow. Discharge tomorrow. He will follow up with [facility]  Haskell RilingHolly R Nafeesah Lapaglia, MD 07/31/2017, 2:15 PM

## 2017-07-31 NOTE — Care Management (Addendum)
RNCM consult received for skilled nursing placement. RNCM will follow. Please assess need for PT evaluation. RNCM will follow for home health and DME needs.  RNCM attempted to meet with patient but he was not in his room when I rounded.

## 2017-07-31 NOTE — [facility] Group Notes (Signed)
07/31/2017  Time: 1:00PM    Type of Therapy and Topic:  Group Therapy:  Overcoming Obstacles   Participation Level:  Active   Description of Group:   In this group patients will be encouraged to explore what they see as obstacles to their own wellness and recovery. They will be guided to discuss their thoughts, feelings, and behaviors related to these obstacles. The group will process together ways to cope with barriers, with attention given to specific choices patients can make. Each patient will be challenged to identify changes they are motivated to make in order to overcome their obstacles. This group will be process-oriented, with patients participating in exploration of their own experiences, giving and receiving support, and processing challenge from other group members.   Therapeutic Goals: 1. Patient will identify personal and current obstacles as they relate to admission. 2. Patient will identify barriers that currently interfere with their wellness or overcoming obstacles.  3. Patient will identify feelings, thought process and behaviors related to these barriers. 4. Patient will identify two changes they are willing to make to overcome these obstacles:      Summary of Patient Progress  Pt continues to work towards their tx goals but has not yet reached them. Pt was able to appropriately participate in group discussion, and was able to offer support/validation to other group members. Pt reported the obstacle that brought him into the hospital was, "writing my family history on the walls and feeling like there was somebody watching me." Pt reported two steps he can take to overcome this obstacle are, "telling myself that it's not real and distracting myself." Pt reported a long term obstacle that he has to manage is, "fear of being rejected." Pt reported two ways he can overcome this obstacle are, "taking more chances and reframing my thoughts to something more positive."    Therapeutic  Modalities:   Cognitive Behavioral Therapy Solution Focused Therapy Motivational Interviewing Relapse Prevention Therapy  Henry DachKelsey Esai Shaffer, MSW, LCSW 07/31/2017 1:46 PM

## 2017-08-01 LAB — GLUCOSE, CAPILLARY: GLUCOSE-CAPILLARY: 247 mg/dL — AB (ref 65–99)

## 2017-08-01 NOTE — Progress Notes (Signed)
Inpatient Diabetes Program Recommendations  AACE/ADA: New Consensus Statement on Inpatient Glycemic Control (2015)  Target Ranges:  Prepandial:   less than 140 mg/dL      Peak postprandial:   less than 180 mg/dL (1-2 hours)      Critically ill patients:  140 - 180 mg/dL   Lab Results  Component Value Date   GLUCAP 247 (H) 08/01/2017   HGBA1C 7.5 (H) 07/22/2017     Inpatient Diabetes Program Recommendations:  Spoke to Dr. Johnella MoloneyMcNew by phone regarding discharge plans for diabetes medications- patient will continue to take Metformin 1000 mg bid when he leaves- he has an MD to follow up with.    Henry RacerJulie Meldon Hanzlik, RN, BA, MHA, CDE Diabetes Coordinator Inpatient Diabetes Program  [phone] (Team Pager) [phone] [facility] Office) 08/01/2017 11:16 AM

## 2017-08-01 NOTE — Discharge Summary (Signed)
Physician Discharge Summary Note  Patient:  Henry Shaffer is an 42 y.o., male MRN:  [id number] DOB:  Dec 14, 1975 Patient phone:  [phone] (home)  Patient address:   [address] [address],  Total Time spent with patient: 15 minutes Plus 20 minutes of medication reconciliation, discharge planning, and discharge documentation  Date of Admission:  07/22/2017 Date of Discharge: 08/01/17  Reason for Admission:  Manic and psychotic behaviors  Principal Problem: Schizoaffective disorder, depressive type [facility]) Discharge Diagnoses: Patient Active Problem List   Diagnosis Date Noted  . Schizoaffective disorder, depressive type (HCC) [F25.1] 07/22/2017    Priority: High  . Asthma [J45.909] 02/14/2017  . HTN (hypertension) [I10] 02/13/2017  . Dyslipidemia [E78.5] 02/13/2017  . Diabetes (HCC) [E11.9] 02/13/2017  . Tobacco use disorder [F17.200] 02/13/2017  . Schizoaffective disorder, bipolar type (HCC) [F25.0] 06/29/2012  . Decreased vision [H54.7] 05/08/2012  . 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 Anxiety   . Depression   . Diabetes (HCC)   . Hyperlipidemia   . Hypertension   . Schizophrenia [facility])     Past Surgical History:  Procedure Laterality Date  . CARDIAC CATHETERIZATION     Family History:  Family History  Problem Relation Age of Onset  . Diabetes Mother   . CAD Mother   . Heart failure Father   . Diabetes Father    Family Psychiatric  History: See H&P Social History:  Social History   Substance and Sexual Activity  Alcohol Use Yes  . Alcohol/week: 0.6 oz  . Types: 1 Cans of beer per week   Comment: occassionally     Social History   Substance and Sexual Activity  Drug Use Yes  . Types: Marijuana   Comment: liquid marijuana 2 weeks ago    Social History   Socioeconomic History  . Marital status: Single    Spouse name:  None  . Number of children: None  . Years of education: None  . Highest education level: None  Social Needs  . Financial resource strain: None  . Food insecurity - worry: None  . Food insecurity - inability: None  . Transportation needs - medical: None  . Transportation needs - non-medical: None  Occupational History  . None  Tobacco Use  . Smoking status: Current Every Day Smoker    Packs/day: 2.00  . Smokeless tobacco: Never Used  Substance and Sexual Activity  . Alcohol use: Yes    Alcohol/week: 0.6 oz    Types: 1 Cans of beer per week    Comment: occassionally  . Drug use: Yes    Types: Marijuana    Comment: liquid marijuana 2 weeks ago  . Sexual activity: No  Other Topics Concern  . None  Social History Narrative   Independent at baseline    Hospital Course:  Pt was admitted after he was displaying bizarre and manic symptoms. Depakote was increased to 1,000 mg BID and Depakote level checked 5 days level was 90. Abilify was discontinued and started on oral Risperdal. He was then transitioned to [name] injections. He received both initial doses while in the hospital. The second injection was given on 07/31/17. He was also started on Luvox on initial presentation for anxiety, depression, and OCD like symptoms. He was found to have cellulitis on ear and scalp and hospitalist was consulted and he was started on  10 day course of doxycycline. On day of discharge, pt was feeling much better. HE was sleeping great and appetite was good. He was much more organized and goal directed. HE was not displaying any bizarre behaviors on the unit. He was very calm and pleasant on the unit. He denied SI during the course of the hospitalization including on day of discharge. He denied HI, AH, VH. I spoke with his mother and she felt that he was back to his baseline.   The patient is at low risk of imminent suicide. Patient denied thoughts, intent, or plan for harm to self or others, expressed  significant future orientation, and expressed an ability to mobilize assistance for his needs. he is presently void of any contributing psychiatric symptoms, cognitive difficulties, or substance use which would elevate his risk for lethality.  At this time,a cute risk for lethality is low and he is stable for ongoing outpatient management.   Modifiable risk factors were addressed during this hospitalization through appropriate pharmacotherapy and establishment of outpatient follow-up treatment. Some risk factors for suicide are situational (i.e. Unstable housing) or related personality pathology (i.e. Poor coping mechanisms) and thus cannot be further mitigated by continued hospitalization in this setting.    Physical Findings: AIMS: Facial and Oral Movements Muscles of Facial Expression: None, normal Lips and Perioral Area: None, normal Jaw: None, normal Tongue: None, normal,Extremity Movements Upper (arms, wrists, hands, fingers): None, normal Lower (legs, knees, ankles, toes): None, normal, Trunk Movements Neck, shoulders, hips: None, normal, Overall Severity Severity of abnormal movements (highest score from questions above): None, normal Incapacitation due to abnormal movements: None, normal Patient's awareness of abnormal movements (rate only patient's report): No Awareness, Dental Status Current problems with teeth and/or dentures?: No Does patient usually wear dentures?: No  CIWA:  CIWA-Ar Total: 1 COWS:  COWS Total Score: 1  Musculoskeletal: Strength & Muscle Tone: within normal limits Gait & Station: normal Patient leans: N/A  Psychiatric Specialty Exam: Physical Exam  Nursing note and vitals reviewed.   Review of Systems  All other systems reviewed and are negative.   Blood pressure 139/77, pulse 81, temperature 98.1 F (36.7 C), temperature source Oral, resp. rate 18, height 5\' 10"  (1.778 m), weight 99.8 kg (220 lb), SpO2 98 %.Body mass index is 31.57 kg/m.  General  Appearance: Casual  Eye Contact:  Good  Speech:  Clear and Coherent  Volume:  Normal  Mood:  Euthymic  Affect:  Congruent  Thought Process:  Coherent and Goal Directed  Orientation:  Full (Time, Place, and Person)  Thought Content:  Logical  Suicidal Thoughts:  No  Homicidal Thoughts:  No  Memory:  Immediate;   Fair  Judgement:  Fair  Insight:  Fair  Psychomotor Activity:  Normal  Concentration:  Concentration: Fair  Recall:  [employer]  Fund of Knowledge:  Fair  Language:  Fair  Akathisia:  No      Assets:  Communication Skills Housing Resilience Social Support  ADL's:  Intact  Cognition:  WNL  Sleep:  Number of Hours: 6.5     Have you used any form of tobacco in the last 30 days? (Cigarettes, Smokeless Tobacco, Cigars, and/or Pipes): Yes  Has this patient used any form of tobacco in the last 30 days? (Cigarettes, Smokeless Tobacco, Cigars, and/or Pipes) Yes, Yes, A prescription for an FDA-approved tobacco cessation medication was offered at discharge and the patient refused  Blood Alcohol level:  Lab Results  Component Value Date   ETH <10  07/21/2017   ETH <5 02/11/2017    Metabolic Disorder Labs:  Lab Results  Component Value Date   HGBA1C 7.5 (H) 07/22/2017   MPG 168.55 07/22/2017   MPG 209 02/15/2017   No results found for: PROLACTIN Lab Results  Component Value Date   CHOL 68 07/22/2017   TRIG 88 07/22/2017   HDL 27 (L) 07/22/2017   CHOLHDL 2.5 07/22/2017   VLDL 18 07/22/2017   LDLCALC 23 07/22/2017   LDLCALC 26 02/15/2017    See Psychiatric Specialty Exam and Suicide Risk Assessment completed by Attending Physician prior to discharge.  Discharge destination:  Home  Is patient on multiple antipsychotic therapies at discharge:  No   Has Patient had three or more failed trials of antipsychotic monotherapy by history:  No  Recommended Plan for Multiple Antipsychotic Therapies: NA   Allergies as of 08/01/2017      Reactions   Codeine    Unknown.  Pt's father reported this in the past, but pt doesn't know his reaction.   Erythromycin Other (See Comments)   Unknown. Pt's father reported this in the past, but pt doesn't know his reaction.   Sulfa Antibiotics    Unknown. Pt's father reported this in the past, but pt doesn't know his reaction.      Medication List    STOP taking these medications   ARIPiprazole 10 MG tablet Commonly known as:  ABILIFY     TAKE these medications     Indication  albuterol 108 (90 Base) MCG/ACT inhaler Commonly known as:  PROVENTIL HFA;VENTOLIN HFA Inhale 2 puffs into the lungs every 6 (six) hours as needed for wheezing or shortness of breath.  Indication:  Disease Involving Spasms of the Bronchus   amLODipine 10 MG tablet Commonly known as:  NORVASC Take 1 tablet (10 mg total) by mouth daily.  Indication:  High Blood Pressure Disorder   aspirin 81 MG chewable tablet Chew 81 mg by mouth daily.  Indication:  hart disease   atorvastatin 80 MG tablet Commonly known as:  LIPITOR Take 1 tablet (80 mg total) by mouth daily.  Indication:  High Amount of Fats in the Blood   divalproex 500 MG 24 hr tablet Commonly known as:  DEPAKOTE ER Take 2 tablets (1,000 mg total) by mouth 2 (two) times daily. What changed:    how much to take  when to take this  Indication:  schizoaffective   doxycycline 100 MG tablet Commonly known as:  VIBRA-TABS Take 1 tablet (100 mg total) by mouth every 12 (twelve) hours for 11 doses.  Indication:  cellulitis   fluvoxaMINE 50 MG tablet Commonly known as:  LUVOX Take 1 tablet (50 mg total) by mouth at bedtime.  Indication:  anxiety   lisinopril 20 MG tablet Commonly known as:  PRINIVIL,ZESTRIL Take 2 tablets (40 mg total) by mouth daily. What changed:  how much to take  Indication:  High Blood Pressure Disorder   metFORMIN 1000 MG tablet Commonly known as:  GLUCOPHAGE Take 1 tablet (1,000 mg total) by mouth 2 (two) times daily.  Indication:  Type 2  Diabetes   paliperidone 156 MG/ML Susp injection Commonly known as:  INVEGA SUSTENNA Inject 1 mL (156 mg total) into the muscle once for 1 dose. Next injection due 08/28/17 Start taking on:  08/28/2017  Indication:  Schizoaffective Disorder   traZODone 50 MG tablet Commonly known as:  DESYREL Take 1 tablet (50 mg total) by mouth at bedtime as needed for sleep.  What changed:    medication strength  how much to take  when to take this  reasons to take this  Indication:  Trouble Sleeping      Follow-up Information    [facility] Go on 08/03/2017.   Why:  Please go to your hospital follow up on Thursday, 08/03/17 at 10:00AM. Thank you! Contact information: [address] [place] [address] [phone]           Follow-up recommendations: Follow up with [facility]. Next Invega dose is 156 mg due on 08/28/17  Signed: Haskell Riling, MD 08/01/2017, 8:16 AM

## 2017-08-01 NOTE — Progress Notes (Signed)
Recreation Therapy Notes  Date: 01.08.2019  Time: 9:30 am  Location: Craft Room  Behavioral response: N/A  Intervention Topic: Team work  Discussion/Intervention: Patient did not attend group. Clinical Observations/Feedback:  Patient did not attend group.  Tomaz Janis LRT/CTRS          [name] Munford 08/01/2017 10:41 AM

## 2017-08-01 NOTE — BHH Suicide Risk Assessment (Signed)
[facility]   Principal Problem: Schizoaffective disorder, depressive type [facility](HCC) Discharge Diagnoses:  Patient Active Problem List   Diagnosis Date Noted  . Schizoaffective disorder, depressive type (HCC) [F25.1] 07/22/2017    Priority: High  . Asthma [J45.909] 02/14/2017  . HTN (hypertension) [I10] 02/13/2017  . Dyslipidemia [E78.5] 02/13/2017  . Diabetes (HCC) [E11.9] 02/13/2017  . Tobacco use disorder [F17.200] 02/13/2017  . Schizoaffective disorder, bipolar type (HCC) [F25.0] 06/29/2012  . Decreased vision [H54.7] 05/08/2012  . 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 Go on 08/03/2017.   Why:  Please go to your hospital follow up on Thursday, 08/03/17 at 10:00AM. Thank you! Contact information: [address] [place] [address] [phone]           Plan Of Care/Follow-up recommendations:  Follow up with [facility], Next Gean BirchwoodINvega Sustenna Injection due on 08/28/17  Haskell RilingHolly R Rickayla Wieland, MD 08/01/2017, 8:14 AM

## 2017-08-01 NOTE — Progress Notes (Signed)
Patient denies SI/HI, denies A/V hallucinations. Patient verbalizes understanding of discharge instructions, follow up care and prescriptions. Patient given all belongings from  locker. Patient escorted out by staff, transported by family. 

## 2017-08-01 NOTE — Progress Notes (Signed)
Recreation Therapy Notes  INPATIENT RECREATION TR PLAN  Patient Details Name: MALOSI HEMSTREET MRN: [id number] DOB: 11-29-1975 Today's Date: 08/01/2017  Rec Therapy Plan Is patient appropriate for Therapeutic Recreation?: Yes Treatment times per week: at least 3 Estimated Length of Stay: 5-7 days TR Treatment/Interventions: Group participation (Comment)  Discharge Criteria Pt will be discharged from therapy if:: Discharged Treatment plan/goals/alternatives discussed and agreed upon by:: Patient/family  Discharge Summary Short term goals set: Patient will identify 3 positive coping skills to decrease depressive symptoms x5 days.  Short term goals met: Complete Progress toward goals comments: Groups attended Which groups?: Coping skills, Anger management, Other (Comment)(Communication, Problem Solving) Reason goals not met: N/A Therapeutic equipment acquired: N/A Reason patient discharged from therapy: Discharge from hospital Pt/family agrees with progress & goals achieved: Yes Date patient discharged from therapy: 08/01/17   Louine Tenpenny 08/01/2017, 2:35 PM

## 2017-08-01 NOTE — Progress Notes (Addendum)
Patient found in day room upon my arrival. Patient is visible and social throughout the evening. Reports anticipatory anxiety regarding discharge tomorrow due to his missing his mother. Reports feeling ready for discharge. Reports he wants to start taking better care of himself through diet and exercise after discharge. States, "I want to do more of the things I'm supposed to do. I spend a lot of time reading the Bible but sometimes it makes it so I get in over my head because I can't understand everything. I think I should do more things and not just that." Reports eating and voiding adequately. CBG 175, no coverage ordered @HS . Denies pain. Wound is clean, dry, and intact. Denies SI, HI, AVH, and depression. BP elevated @2000 , given Clonidine with positive results. Given Trazodone for sleep but remained awake @2250 . Given Vistaril with positive results. Patient had anticipated difficulty sleeping due to excitement regarding return to home as well as some anxiety regarding peer outbursts in the milieu. Compliant with HS medications and staff direction. Q 15 minute checks maintained. Will continue to monitor throughout the shift. Patient slept 6.5 hours. VSS. Patient awakened happy and looking forward to returning home. Denies complaints. Will endorse care to oncoming shift.

## 2017-08-01 NOTE — Progress Notes (Signed)
  [facility] :  Will you be returning to the same living situation after discharge:  Yes,  returning to mother's home. At discharge, do you have transportation home?: Yes,  pt's brother is transporting pt home.  Do you have the ability to pay for your medications: Yes,  insurance.  Release of information consent forms completed and in the chart;  Patient's signature needed at discharge.  Patient to Follow up at: Follow-up Information    [facility] Go on 08/03/2017.   Why:  Please go to your hospital follow up on Thursday, 08/03/17 at 10:00AM. Thank you! Contact information: [address] [place] [address] [phone]           Next level of care provider has access to [facility] Link:no  Safety Planning and Suicide Prevention discussed: Yes,  with pt's brother  Have you used any form of tobacco in the last 30 days? (Cigarettes, Smokeless Tobacco, Cigars, and/or Pipes): Yes  Has patient been referred to the Quitline?: Patient refused referral  Patient has been referred for addiction treatment: N/A  Heidi DachKelsey Jamiere Gulas, LCSW 08/01/2017, 7:56 AM

## 2017-08-01 NOTE — Plan of Care (Signed)
  Progressing Education: Knowledge of [facility] Education information/materials will improve 08/01/2017 0114 - Progressing by [name], Tre Sanker E, RN Emotional status will improve 08/01/2017 0114 - Progressing by [name], Shiven Junious E, RN Mental status will improve 08/01/2017 0114 - Progressing by [name], Gay Moncivais E, RN Verbalization of understanding the information provided will improve 08/01/2017 0114 - Progressing by [name], Kirstin Kugler E, RN Safety: Periods of time without injury will increase 08/01/2017 0114 - Progressing by [name], Thuy Atilano E, RN Education: Will be free of psychotic symptoms 08/01/2017 0114 - Progressing by [name], Maayan Jenning E, RN Knowledge of the prescribed therapeutic regimen will improve 08/01/2017 0114 - Progressing by [name], Irelynn Schermerhorn E, RN Activity: Interest or engagement in leisure activities will improve 08/01/2017 0114 - Progressing by [name], Rolen Conger E, RN Imbalance in normal sleep/wake cycle will improve 08/01/2017 0114 - Progressing by [name], Brion Sossamon E, RN Education: Utilization of techniques to improve thought processes will improve 08/01/2017 0114 - Progressing by [name], Hayli Milligan E, RN Knowledge of the prescribed therapeutic regimen will improve 08/01/2017 0114 - Progressing by [name], Amayah Staheli E, RN Coping: Ability to cope will improve 08/01/2017 0114 - Progressing by [name], Jamea Robicheaux E, RN Ability to verbalize feelings will improve 08/01/2017 0114 - Progressing by [name], Augusto Deckman E, RN [facility]ation in Recreation Therapeutic Interventions ST[facility]-Other Recreation Therapy Goal (Specify) Description Patient will identify 3 positive coping skills to decrease depressive symptoms x5 days.   08/01/2017 0114 - Progressing by [name], Lyndon Chapel E, RN Activity: Sleeping patterns will improve 08/01/2017 0114 - Progressing by [name], Sallee Hogrefe E, RN

## 2017-11-07 ENCOUNTER — Encounter: Payer: Self-pay | Admitting: Emergency Medicine

## 2017-11-07 ENCOUNTER — Emergency Department: Payer: Medicare HMO

## 2017-11-07 ENCOUNTER — Observation Stay
Admission: EM | Admit: 2017-11-07 | Discharge: 2017-11-08 | Disposition: A | Discharge status: Home Health | Payer: Medicare HMO | Attending: Internal Medicine | Admitting: Internal Medicine

## 2017-11-07 ENCOUNTER — Other Ambulatory Visit: Payer: Self-pay

## 2017-11-07 DIAGNOSIS — Z7982 Long term (current) use of aspirin: Secondary | ICD-10-CM | POA: Insufficient documentation

## 2017-11-07 DIAGNOSIS — E119 Type 2 diabetes mellitus without complications: Secondary | ICD-10-CM | POA: Insufficient documentation

## 2017-11-07 DIAGNOSIS — Z7984 Long term (current) use of oral hypoglycemic drugs: Secondary | ICD-10-CM | POA: Diagnosis not present

## 2017-11-07 DIAGNOSIS — I160 Hypertensive urgency: Secondary | ICD-10-CM | POA: Diagnosis present

## 2017-11-07 DIAGNOSIS — Z883 Allergy status to other anti-infective agents status: Secondary | ICD-10-CM | POA: Insufficient documentation

## 2017-11-07 DIAGNOSIS — R42 Dizziness and giddiness: Principal | ICD-10-CM | POA: Insufficient documentation

## 2017-11-07 DIAGNOSIS — E785 Hyperlipidemia, unspecified: Secondary | ICD-10-CM | POA: Insufficient documentation

## 2017-11-07 DIAGNOSIS — F319 Bipolar disorder, unspecified: Secondary | ICD-10-CM | POA: Insufficient documentation

## 2017-11-07 DIAGNOSIS — F17200 Nicotine dependence, unspecified, uncomplicated: Secondary | ICD-10-CM | POA: Insufficient documentation

## 2017-11-07 DIAGNOSIS — Z79899 Other long term (current) drug therapy: Secondary | ICD-10-CM | POA: Insufficient documentation

## 2017-11-07 DIAGNOSIS — Z881 Allergy status to other antibiotic agents status: Secondary | ICD-10-CM | POA: Diagnosis not present

## 2017-11-07 DIAGNOSIS — Z885 Allergy status to narcotic agent status: Secondary | ICD-10-CM | POA: Diagnosis not present

## 2017-11-07 DIAGNOSIS — F209 Schizophrenia, unspecified: Secondary | ICD-10-CM | POA: Diagnosis not present

## 2017-11-07 LAB — BASIC METABOLIC PANEL
ANION GAP: 9 (ref 5–15)
BUN: 12 mg/dL (ref 6–20)
CHLORIDE: 96 mmol/L — AB (ref 101–111)
CO2: 26 mmol/L (ref 22–32)
Calcium: 9.2 mg/dL (ref 8.9–10.3)
Creatinine, Ser: 1.36 mg/dL — ABNORMAL HIGH (ref 0.61–1.24)
GFR calc Af Amer: >60 mL/min (ref >60)
GLUCOSE: 262 mg/dL — AB (ref 65–99)
POTASSIUM: 4.1 mmol/L (ref 3.5–5.1)
Sodium: 131 mmol/L — ABNORMAL LOW (ref 135–145)

## 2017-11-07 LAB — URINALYSIS, COMPLETE (UACMP) WITH MICROSCOPIC
BACTERIA UA: NONE SEEN
BILIRUBIN URINE: NEGATIVE
Glucose, UA: >=500 mg/dL — AB
Hgb urine dipstick: NEGATIVE
KETONES UR: NEGATIVE mg/dL
LEUKOCYTES UA: NEGATIVE
Nitrite: NEGATIVE
PH: 7 (ref 5.0–8.0)
PROTEIN: 30 mg/dL — AB
RBC / HPF: NONE SEEN RBC/hpf (ref 0–5)
Specific Gravity, Urine: 1.009 (ref 1.005–1.030)
Squamous Epithelial / LPF: NONE SEEN

## 2017-11-07 LAB — CBC
HEMATOCRIT: 45.8 % (ref 40.0–52.0)
HEMOGLOBIN: 15.2 g/dL (ref 13.0–18.0)
MCH: 28.5 pg (ref 26.0–34.0)
MCHC: 33.2 g/dL (ref 32.0–36.0)
MCV: 86 fL (ref 80.0–100.0)
PLATELETS: 218 10*3/uL (ref 150–440)
RBC: 5.33 MIL/uL (ref 4.40–5.90)
RDW: 14.2 % (ref 11.5–14.5)
WBC: 12.6 10*3/uL — AB (ref 3.8–10.6)

## 2017-11-07 LAB — GLUCOSE, CAPILLARY
GLUCOSE-CAPILLARY: 224 mg/dL — AB (ref 65–99)
GLUCOSE-CAPILLARY: 175 mg/dL — AB (ref 65–99)
Glucose-Capillary: 305 mg/dL — ABNORMAL HIGH (ref 65–99)
Glucose-Capillary: 178 mg/dL — ABNORMAL HIGH (ref 65–99)

## 2017-11-07 LAB — HEMOGLOBIN A1C
Hgb A1c MFr Bld: 9 % — ABNORMAL HIGH (ref 4.8–5.6)
Mean Plasma Glucose: 211.6 mg/dL

## 2017-11-07 LAB — TROPONIN I
Troponin I: 0.06 ng/mL (ref <0.03)
Troponin I: 0.07 ng/mL (ref <0.03)
Troponin I: 0.06 ng/mL (ref <0.03)

## 2017-11-07 LAB — VALPROIC ACID LEVEL: VALPROIC ACID LVL: 13 ug/mL — AB (ref 50.0–100.0)

## 2017-11-07 MED ORDER — SODIUM CHLORIDE 0.9 % IV BOLUS
500.0000 mL | Freq: Once | INTRAVENOUS | Status: AC
  Administered 2017-11-07: 500 mL via INTRAVENOUS

## 2017-11-07 MED ORDER — ENOXAPARIN SODIUM 40 MG/0.4ML ~~LOC~~ SOLN
40.0000 mg | SUBCUTANEOUS | Status: DC
  Administered 2017-11-07 – 2017-11-08 (×2): 40 mg via SUBCUTANEOUS
  Filled 2017-11-07 (×2): qty 0.4

## 2017-11-07 MED ORDER — ALBUTEROL SULFATE (2.5 MG/3ML) 0.083% IN NEBU
2.5000 mg | INHALATION_SOLUTION | Freq: Four times a day (QID) | RESPIRATORY_TRACT | Status: DC | PRN
  Administered 2017-11-07: 2.5 mg via RESPIRATORY_TRACT
  Filled 2017-11-07: qty 3

## 2017-11-07 MED ORDER — DIVALPROEX SODIUM ER 500 MG PO TB24
1000.0000 mg | ORAL_TABLET | Freq: Two times a day (BID) | ORAL | Status: DC
  Administered 2017-11-07 – 2017-11-08 (×3): 1000 mg via ORAL
  Filled 2017-11-07 (×4): qty 2

## 2017-11-07 MED ORDER — BISACODYL 5 MG PO TBEC: 5.0000 mg | DELAYED_RELEASE_TABLET | Freq: Every day | ORAL | Status: DC | PRN

## 2017-11-07 MED ORDER — INSULIN ASPART 100 UNIT/ML ~~LOC~~ SOLN
0.0000 [IU] | Freq: Three times a day (TID) | SUBCUTANEOUS | Status: DC
  Administered 2017-11-07: 3 [IU] via SUBCUTANEOUS
  Administered 2017-11-07: 5 [IU] via SUBCUTANEOUS
  Administered 2017-11-07: 11 [IU] via SUBCUTANEOUS
  Administered 2017-11-08 (×2): 5 [IU] via SUBCUTANEOUS
  Filled 2017-11-07 (×4): qty 1

## 2017-11-07 MED ORDER — TRAZODONE HCL 50 MG PO TABS
50.0000 mg | ORAL_TABLET | Freq: Every evening | ORAL | Status: DC | PRN
Start: 2017-11-07 — End: 2017-11-07

## 2017-11-07 MED ORDER — ATORVASTATIN CALCIUM 20 MG PO TABS
80.0000 mg | ORAL_TABLET | Freq: Every day | ORAL | Status: DC
  Administered 2017-11-07 – 2017-11-08 (×2): 80 mg via ORAL
  Filled 2017-11-07 (×2): qty 4

## 2017-11-07 MED ORDER — AMLODIPINE BESYLATE 10 MG PO TABS
10.0000 mg | ORAL_TABLET | Freq: Every day | ORAL | Status: DC
  Administered 2017-11-07 – 2017-11-08 (×2): 10 mg via ORAL
  Filled 2017-11-07 (×2): qty 1

## 2017-11-07 MED ORDER — ACETAMINOPHEN 325 MG PO TABS
650.0000 mg | ORAL_TABLET | Freq: Four times a day (QID) | ORAL | Status: DC | PRN
  Administered 2017-11-07 (×2): 650 mg via ORAL
  Filled 2017-11-07 (×2): qty 2

## 2017-11-07 MED ORDER — CLONIDINE HCL 0.1 MG PO TABS
0.1000 mg | ORAL_TABLET | Freq: Every day | ORAL | Status: DC
  Administered 2017-11-07 – 2017-11-08 (×2): 0.1 mg via ORAL
  Filled 2017-11-07 (×2): qty 1

## 2017-11-07 MED ORDER — SALINE SPRAY 0.65 % NA SOLN
1.0000 | NASAL | Status: DC | PRN
  Administered 2017-11-07: 1 via NASAL
  Filled 2017-11-07: qty 44

## 2017-11-07 MED ORDER — ASPIRIN EC 81 MG PO TBEC
81.0000 mg | DELAYED_RELEASE_TABLET | Freq: Every day | ORAL | Status: DC
  Administered 2017-11-07 – 2017-11-08 (×2): 81 mg via ORAL
  Filled 2017-11-07 (×2): qty 1

## 2017-11-07 MED ORDER — CLONIDINE HCL 0.1 MG PO TABS
0.2000 mg | ORAL_TABLET | ORAL | Status: AC
  Administered 2017-11-07: 0.2 mg via ORAL
  Filled 2017-11-07: qty 2

## 2017-11-07 MED ORDER — TRAZODONE HCL 50 MG PO TABS
50.0000 mg | ORAL_TABLET | Freq: Every day | ORAL | Status: DC
  Administered 2017-11-07: 50 mg via ORAL
  Filled 2017-11-07: qty 1

## 2017-11-07 MED ORDER — LISINOPRIL 20 MG PO TABS
40.0000 mg | ORAL_TABLET | Freq: Every day | ORAL | Status: DC
  Administered 2017-11-07 – 2017-11-08 (×2): 40 mg via ORAL
  Filled 2017-11-07 (×2): qty 2

## 2017-11-07 MED ORDER — ONDANSETRON HCL 4 MG/2ML IJ SOLN: 4.0000 mg | Freq: Four times a day (QID) | INTRAMUSCULAR | Status: DC | PRN

## 2017-11-07 MED ORDER — ARIPIPRAZOLE 10 MG PO TABS
10.0000 mg | ORAL_TABLET | Freq: Every day | ORAL | Status: DC
  Administered 2017-11-07 – 2017-11-08 (×2): 10 mg via ORAL
  Filled 2017-11-07 (×2): qty 1

## 2017-11-07 MED ORDER — FLUVOXAMINE MALEATE 50 MG PO TABS
50.0000 mg | ORAL_TABLET | Freq: Every day | ORAL | Status: DC
  Filled 2017-11-07: qty 1

## 2017-11-07 MED ORDER — HYDRALAZINE HCL 20 MG/ML IJ SOLN
10.0000 mg | Freq: Once | INTRAMUSCULAR | Status: AC
Start: 2017-11-07 — End: 2017-11-07
  Administered 2017-11-07: 10 mg via INTRAVENOUS
  Filled 2017-11-07: qty 1

## 2017-11-07 MED ORDER — HYDRALAZINE HCL 20 MG/ML IJ SOLN
10.0000 mg | Freq: Four times a day (QID) | INTRAMUSCULAR | Status: DC | PRN
  Administered 2017-11-07: 10 mg via INTRAVENOUS
  Filled 2017-11-07: qty 1

## 2017-11-07 MED ORDER — ACETAMINOPHEN 650 MG RE SUPP: 650.0000 mg | Freq: Four times a day (QID) | RECTAL | Status: DC | PRN

## 2017-11-07 MED ORDER — IPRATROPIUM-ALBUTEROL 0.5-2.5 (3) MG/3ML IN SOLN
3.0000 mL | Freq: Once | RESPIRATORY_TRACT | Status: AC
  Administered 2017-11-07: 3 mL via RESPIRATORY_TRACT
  Filled 2017-11-07: qty 3

## 2017-11-07 MED ORDER — SODIUM CHLORIDE 0.9 % IV SOLN
INTRAVENOUS | Status: AC
  Administered 2017-11-07: 100 mL/h via INTRAVENOUS

## 2017-11-07 MED ORDER — INSULIN GLARGINE 100 UNIT/ML ~~LOC~~ SOLN
20.0000 [IU] | Freq: Every day | SUBCUTANEOUS | Status: DC
  Administered 2017-11-07 – 2017-11-08 (×2): 20 [IU] via SUBCUTANEOUS
  Filled 2017-11-07 (×3): qty 0.2

## 2017-11-07 MED ORDER — METOPROLOL TARTRATE 5 MG/5ML IV SOLN
5.0000 mg | INTRAVENOUS | Status: AC
  Administered 2017-11-07: 5 mg via INTRAVENOUS
  Filled 2017-11-07: qty 5

## 2017-11-07 MED ORDER — METOPROLOL TARTRATE 5 MG/5ML IV SOLN
5.0000 mg | Freq: Four times a day (QID) | INTRAVENOUS | Status: DC | PRN
  Filled 2017-11-07: qty 5

## 2017-11-07 MED ORDER — HYDRALAZINE HCL 20 MG/ML IJ SOLN
10.0000 mg | Freq: Once | INTRAMUSCULAR | Status: AC
  Administered 2017-11-07: 10 mg via INTRAVENOUS
  Filled 2017-11-07: qty 1

## 2017-11-07 MED ORDER — ONDANSETRON HCL 4 MG PO TABS: 4.0000 mg | ORAL_TABLET | Freq: Four times a day (QID) | ORAL | Status: DC | PRN

## 2017-11-07 MED ORDER — SENNOSIDES-DOCUSATE SODIUM 8.6-50 MG PO TABS: 1.0000 | ORAL_TABLET | Freq: Every evening | ORAL | Status: DC | PRN

## 2017-11-07 NOTE — Progress Notes (Signed)
Admitted this morning for dizziness and found to have malignant hypertension with BP around 200 2/131.  Patient told me that he is ran out of his BP medicines 2 weeks ago and noncompliant with medicine.  Patient still has high BP and denies any complaints now. Labs, lab medications reviewed Assessment and plan #1 malignant hypertension: Restarted on amlodipine 10 mg daily, lisinopril 20 mg daily, added clonidine 0.1 mg daily and give 0.2 mg 1 dose stat now, his as needed hydralazine for blood pressure control, will give him 1 dose of IV metoprolol 5 mg one time because BP still 190/110. 2.  Diabetes mellitus type 2: Uncontrolled; noncompliance with medicines at home, started on Lantus, sliding scale insulin with coverage, seen by diabetic coordinator, 3.  Bipolar disorder: Continue Depakote. Time spent 20 minutes Discontinue IV fluids

## 2017-11-07 NOTE — H&P (Signed)
[employer] - [place] [facility]   PATIENT NAME: Henry Shaffer    MR#:  [id number]  DATE OF BIRTH:  07-Mar-1976  DATE OF ADMISSION:  11/07/2017  PRIMARY CARE PHYSICIAN: Anselm Jungling, MD   REQUESTING/REFERRING PHYSICIAN: [facility]  CHIEF COMPLAINT:   Chief Complaint  Patient presents with  . Dizziness    HISTORY OF PRESENT ILLNESS:  Henry Shaffer  is a 42 y.o. male with a known history of T2NIDDM, HTN, HLD, non-obstructive CAD (seen on 06/22/2017 cath), tobacco use D/O, asthma, IBS, schizoaffective D/O who p/w hypertensive urgency. Pt attempts to provide narrative involving misplacing his antihypertensive medications, but does not provide coherent explanation as to why he has been off his medications. He is unable to tell me for how long he had been off his medications. He states he found and resumed taking his antihypertensives ~2wks ago, but states that, "they aren't doing anything". He states that he started to experience lightheadedness last night (Monday 11/06/2017). He states that he developed transient pain in his left arm, followed by lightheadedness and dysequilibrium. He states that he was at a birthday celebration for his nephew when he started feeling lightheaded, and he says he stumbled and fell into a wall, but caught himself and did not sustain any injuries. After this incident, he states his lightheadedness continued to get progressively worse, and he started to also develop fatigue/malaise/lethargy/tiredness and decreased exercise tolerance. He states he called EMS, and was brought to the hospital by ambulance. BP was 202/131 on initial measurement in the ED. Pt endorses 2d Hx intermittent blurred vision and frontal headache over the temples. He states these symptoms have resolved by the time of my Hx/examination (at which time his BP was 156/96). He denies F/C/N/V/D/AP, CP/SOB, palpitations, diaphoresis, night sweats, rigors, vertigo, changes in  hearing/vision, LOC, urinary symptoms.  PAST MEDICAL HISTORY:   Past Medical History:  Diagnosis Date  . Anxiety   . Depression   . Diabetes (HCC)   . Hyperlipidemia   . Hypertension   . Schizophrenia (HCC)     PAST SURGICAL HISTORY:   Past Surgical History:  Procedure Laterality Date  . CARDIAC CATHETERIZATION      SOCIAL HISTORY:   Social History   Tobacco Use  . Smoking status: Current Every Day Smoker    Packs/day: 2.00  . Smokeless tobacco: Never Used  Substance Use Topics  . Alcohol use: Yes    Alcohol/week: 0.6 oz    Types: 1 Cans of beer per week    Comment: occassionally    FAMILY HISTORY:   Family History  Problem Relation Age of Onset  . Diabetes Mother   . CAD Mother   . Heart failure Father   . Diabetes Father     DRUG ALLERGIES:   Allergies  Allergen Reactions  . Codeine     Unknown. Pt's father reported this in the past, but pt doesn't know his reaction.  . Erythromycin Other (See Comments)    Unknown. Pt's father reported this in the past, but pt doesn't know his reaction.  . Sulfa Antibiotics     Unknown. Pt's father reported this in the past, but pt doesn't know his reaction.    REVIEW OF SYSTEMS:   Review of Systems  Constitutional: Positive for malaise/fatigue. Negative for chills, diaphoresis, fever and weight loss.  HENT: Negative for congestion, hearing loss, nosebleeds, sore throat and tinnitus.   Eyes: Positive for blurred vision. Negative for double vision and photophobia.  Respiratory: Negative for cough, hemoptysis, sputum production, shortness of breath and wheezing.   Cardiovascular: Negative for chest pain, palpitations, orthopnea, claudication, leg swelling and PND.  Gastrointestinal: Negative for abdominal pain, blood in stool, constipation, diarrhea, heartburn, melena, nausea and vomiting.  Genitourinary: Negative for dysuria, frequency, hematuria and urgency.  Musculoskeletal: Negative for back pain, joint pain and  neck pain.  Skin: Negative for itching and rash.  Neurological: Positive for dizziness, weakness and headaches. Negative for tingling, tremors, sensory change, speech change, focal weakness, seizures and loss of consciousness.    MEDICATIONS AT HOME:   Prior to Admission medications   Medication Sig Start Date End Date Taking? Authorizing Provider  albuterol (PROVENTIL HFA;VENTOLIN HFA) 108 (90 Base) MCG/ACT inhaler Inhale 2 puffs into the lungs every 6 (six) hours as needed for wheezing or shortness of breath.     [provider]  amLODipine (NORVASC) 10 MG tablet Take 1 tablet (10 mg total) by mouth daily. 07/31/17   McNew, Ileene Hutchinson, MD  aspirin 81 MG chewable tablet Chew 81 mg by mouth daily.     [provider]  atorvastatin (LIPITOR) 80 MG tablet Take 1 tablet (80 mg total) by mouth daily. 07/31/17   McNew, Ileene Hutchinson, MD  divalproex (DEPAKOTE ER) 500 MG 24 hr tablet Take 2 tablets (1,000 mg total) by mouth 2 (two) times daily. 07/31/17 08/30/17  McNew, Ileene Hutchinson, MD  fluvoxaMINE (LUVOX) 50 MG tablet Take 1 tablet (50 mg total) by mouth at bedtime. 07/31/17   McNew, Ileene Hutchinson, MD  lisinopril (PRINIVIL,ZESTRIL) 20 MG tablet Take 2 tablets (40 mg total) by mouth daily. 07/31/17   McNew, Ileene Hutchinson, MD  metFORMIN (GLUCOPHAGE) 1000 MG tablet Take 1 tablet (1,000 mg total) by mouth 2 (two) times daily. 07/31/17   McNew, Ileene Hutchinson, MD  paliperidone (INVEGA SUSTENNA) 156 MG/ML SUSP injection Inject 1 mL (156 mg total) into the muscle once for 1 dose. Next injection due 08/28/17 08/28/17 08/28/17  McNew, Ileene Hutchinson, MD  traZODone (DESYREL) 50 MG tablet Take 1 tablet (50 mg total) by mouth at bedtime as needed for sleep. 07/31/17   McNew, Ileene Hutchinson, MD      VITAL SIGNS:  Blood pressure (!) 156/96, pulse 91, temperature 98.6 F (37 C), temperature source Oral, resp. rate 14, height 6' (1.829 m), weight 99.8 kg (220 lb), SpO2 99 %.  PHYSICAL EXAMINATION:  Physical Exam  Constitutional: He is oriented to person,  place, and time. He appears well-developed and well-nourished. No distress.  HENT:  Head: Normocephalic and atraumatic.  Mouth/Throat: No oropharyngeal exudate.  Eyes: Pupils are equal, round, and reactive to light. EOM are normal. No scleral icterus.  Neck: Neck supple. No JVD present. No thyromegaly present.  Cardiovascular: Normal rate, regular rhythm and normal heart sounds. Exam reveals no gallop and no friction rub.  No murmur heard. Pulmonary/Chest: Effort normal and breath sounds normal. No stridor. No respiratory distress. He has no wheezes. He has no rales.  Abdominal: Soft. Bowel sounds are normal. He exhibits no distension. There is no tenderness. There is no rebound and no guarding.  Musculoskeletal: Normal range of motion. He exhibits no edema.  Lymphadenopathy:    He has no cervical adenopathy.  Neurological: He is alert and oriented to person, place, and time. He displays normal reflexes. No cranial nerve deficit or sensory deficit. He exhibits normal muscle tone. Coordination normal.  Skin: Skin is warm and dry. No rash noted. He is not diaphoretic. No erythema.  Psychiatric: Thought content normal. His mood appears not anxious. His affect is blunt. His affect is not angry, not labile and not inappropriate. His speech is not rapid and/or pressured, not delayed, not tangential and not slurred. He is slowed. He is not agitated, not aggressive, not hyperactive, not withdrawn, not actively hallucinating and not combative. Thought content is not paranoid and not delusional. Cognition and memory are not impaired. He does not express impulsivity or inappropriate judgment. He does not exhibit a depressed mood. He is communicative. He exhibits normal recent memory and normal remote memory. He is attentive.    LABORATORY PANEL:   CBC Recent Labs  Lab 11/07/17 0021  WBC 12.6*  HGB 15.2  HCT 45.8  PLT 218    ------------------------------------------------------------------------------------------------------------------  Chemistries  Recent Labs  Lab 11/07/17 0021  NA 131*  K 4.1  CL 96*  CO2 26  GLUCOSE 262*  BUN 12  CREATININE 1.36*  CALCIUM 9.2   ------------------------------------------------------------------------------------------------------------------  Cardiac Enzymes Recent Labs  Lab 11/07/17 0440  TROPONINI 0.07*   ------------------------------------------------------------------------------------------------------------------  RADIOLOGY:  Dg Chest 2 View  Result Date: 11/07/2017 CLINICAL DATA:  Acute onset of left arm pain and dizziness. Generalized weakness. EXAM: CHEST - 2 VIEW COMPARISON:  Chest radiograph performed 02/13/2017 FINDINGS: The lungs are well-aerated. Pulmonary vascularity is at the upper limits of normal. There is no evidence of focal opacification, pleural effusion or pneumothorax. The heart is normal in size; the mediastinal contour is within normal limits. No acute osseous abnormalities are seen. IMPRESSION: No acute cardiopulmonary process seen. Electronically Signed   By: Roanna RaiderJeffery  Chang M.D.   On: 11/07/2017 02:25   Ct Head Wo Contrast  Result Date: 11/07/2017 CLINICAL DATA:  Acute onset of dizziness and generalized weakness. Left arm pain. EXAM: CT HEAD WITHOUT CONTRAST TECHNIQUE: Contiguous axial images were obtained from the base of the skull through the vertex without intravenous contrast. COMPARISON:  None. FINDINGS: Brain: No evidence of acute infarction, hemorrhage, hydrocephalus, extra-axial collection or mass lesion/mass effect. The posterior fossa, including the cerebellum, brainstem and fourth ventricle, is within normal limits. The third and lateral ventricles, and basal ganglia are unremarkable in appearance. The cerebral hemispheres are symmetric in appearance, with normal gray-white differentiation. No mass effect or midline shift  is seen. Vascular: No hyperdense vessel or unexpected calcification. Skull: There is no evidence of fracture; visualized osseous structures are unremarkable in appearance. Sinuses/Orbits: The visualized portions of the orbits are within normal limits. The paranasal sinuses and mastoid air cells are well-aerated. Other: No significant soft tissue abnormalities are seen. IMPRESSION: Unremarkable noncontrast CT of the head. Electronically Signed   By: Roanna RaiderJeffery  Chang M.D.   On: 11/07/2017 02:32   IMPRESSION AND PLAN:   A/P: 90M HTN urgency, hyponatremia, renal insufficiency, troponin leak, leukocytosis, hyperglycemia/glycosuria.  1.) HTN Urgency: BP 202/131 on admission. HTN urgency in the setting of medication non-adherence. No clear evidence of acute end-organ damage. Not clinically in cardiogenic shock, lungs CTAB, heart sounds normal. CXR (-) acute intrapulmonary process. Neurologically non-focal. CT head (-) acute intracranial process. Na+ 131 (w/ Cl- 96), likely 2/2 pressure natriuresis. Cr. 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 Cr was 1.28 as of 07/21/2017, and 1.49 as of 02/11/2017, does not appear to be in AKI on present admission. Trop-I 0.06, 0.07, leak 2/2 strain in setting of high afterload, had cardiac cath 06/22/2017 which demonstrated mild non-obstructive CAD. Goal 25% reduction of BP in first 24hrs. Resumed pt's home medications (Amlodipine,  Lisinopril). Metoprolol IV PRN SBP > 200. Tele, cardiac monitoring.  2.) Hyponatremia: Na+ 131 (w/ Cl- 96), likely 2/2 pressure natriuresis. IVF NS. Monitor BMP.  3.) Renal insufficiency: Cr. 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 Cr was 1.28 as of 07/21/2017, and 1.49 as of 02/11/2017, does not appear to be in AKI on present admission. Monitor BMP, avoid nephrotoxins.  4.) Troponin leak/HLD/CAD: Trop-I 0.06, 0.07, leak 2/2 strain in setting of high afterload, had cardiac cath 06/22/2017  which demonstrated mild non-obstructive CAD. BP management as above. C/w ASA, Statin, ACE-I.  5.) Leukocytosis: WBC 12.6. Afebrile, (-) tachyardia/tachypnea/hypoxia at the time of my exam. SIRS (-). No clear etiology. WBC elevated on 01/2017 and 06/2017 admissions as well, may be chronic elevation.  6.) T2NIDDM/hypoglycemia/glycosuria: Glucose 262, urine glucose > 500. Suspect poor diabetic control. MSSI.  7.) Schizoaffective D/O: Depakote levels pending. Depakote + Luvox resumed.  8.) Asthma: PRN nebs.  9.) FEN/GI: Cardiac diabetic diet, IVF NS.  10.) DVT PPx: Lovenox 40mg  SQ qD.  11.) Code status: Full code.  12.) Disposition: Admission, pt expected to stay > 2 midnights.  All the records are reviewed and case discussed with ED provider. Management plans discussed with the patient, family and they are in agreement.  CODE STATUS: Full code.  TOTAL TIME TAKING CARE OF THIS PATIENT: 75 minutes.    Barbaraann Rondo M.D on 11/07/2017 at 6:48 AM  Between 7am to 6pm - Pager - [phone]  After 6pm go to [email or web address] - Scientist, research (life sciences) [facility]  Office  [phone]  CC: Primary care physician; Anselm Jungling, MD   Note: This dictation was prepared with Dragon dictation along with smaller phrase technology. Any transcriptional errors that result from this process are unintentional.

## 2017-11-07 NOTE — Progress Notes (Signed)
Inpatient Diabetes Program Recommendations  AACE/ADA: New Consensus Statement on Inpatient Glycemic Control (2015)  Target Ranges:  Prepandial:   less than 140 mg/dL      Peak postprandial:   less than 180 mg/dL (1-2 hours)      Critically ill patients:  140 - 180 mg/dL   Lab Results  Component Value Date   GLUCAP 224 (H) 11/07/2017   HGBA1C 7.5 (H) 07/22/2017    Review of Glycemic ControlResults for Henry Shaffer, Henry Shaffer (MRN [id number]) as of 11/07/2017 11:18  Ref. Range 11/07/2017 08:22  Glucose-Capillary Latest Ref Range: 65 - 99 mg/dL 409224 (H)    Diabetes history: Type 2 DM Outpatient Diabetes medications:  Metformin 1000 mg bid Current orders for Inpatient glycemic control:  Novolog moderate tid with meals  Inpatient Diabetes Program Recommendations:    Please check A1C to determine pre-hospitalization glycemic control. If fasting blood sugars continue to be greater than 150 mg/dL, consider adding Lantus 20 units daily while in the hospital.   Thanks, Beryl MeagerJenny Nissa Stannard, RN, BC-ADM Inpatient Diabetes Coordinator Pager [phone] (8a-5p)

## 2017-11-07 NOTE — ED Provider Notes (Addendum)
[facility] Emergency Department Provider Note   ____________________________________________   First MD Initiated Contact with Patient 11/07/17 [phone]     (approximate)  I have reviewed the triage vital signs and the nursing notes.   HISTORY  Chief Complaint Dizziness    HPI Henry Shaffer is a 42 y.o. male who comes into the hospital today with some dizziness and elevated blood pressure.  The patient reports that his blood pressure has been elevated in his left arm was feeling uncomfortable.  The patient states that this is been going on for a while.  The symptoms started after he misplaced his blood pressure medicines.  He reports that he started them back but then he ran out.  His blood pressure has been elevated for a while but the dizziness did start today.  He had restarted his medications but was out of it for a day.  He thought he could hold off on taking more meds but then he started feeling bad.  The patient denies any chest pain and has had some mild shortness of breath with a cough and a headache.  The patient has been taking lisinopril.  The patient was concerned about the symptoms especially with his blood pressure so he decided to come into the hospital today for evaluation.   Past Medical History:  Diagnosis Date  . Anxiety   . Depression   . Diabetes (HCC)   . Hyperlipidemia   . Hypertension   . Schizophrenia [facility])     Patient Active Problem List   Diagnosis Date Noted  . Hypertensive urgency 11/07/2017  . Schizoaffective disorder, depressive type (HCC) 07/22/2017  . Asthma 02/14/2017  . HTN (hypertension) 02/13/2017  . Dyslipidemia 02/13/2017  . Diabetes (HCC) 02/13/2017  . Tobacco use disorder 02/13/2017  . Schizoaffective disorder, bipolar type (HCC) 06/29/2012  . Decreased vision 05/08/2012  . Conductive hearing loss, middle ear 11/02/2011    Past Surgical History:  Procedure Laterality Date  . CARDIAC CATHETERIZATION        Prior to Admission medications   Medication Sig Start Date End Date Taking? Authorizing Provider  albuterol (PROVENTIL HFA;VENTOLIN HFA) 108 (90 Base) MCG/ACT inhaler Inhale 2 puffs into the lungs every 6 (six) hours as needed for wheezing or shortness of breath.    Yes [provider]  divalproex (DEPAKOTE) 500 MG DR tablet Take 500 mg by mouth daily. 10/11/17  Yes [provider]  amLODipine (NORVASC) 10 MG tablet Take 1 tablet (10 mg total) by mouth daily. 11/08/17   Katha Hamming, MD  ARIPiprazole (ABILIFY) 10 MG tablet Take 1 tablet (10 mg total) by mouth daily. 11/08/17   Katha Hamming, MD  atorvastatin (LIPITOR) 80 MG tablet Take 1 tablet (80 mg total) by mouth daily. 11/08/17   Katha Hamming, MD  cloNIDine (CATAPRES) 0.1 MG tablet Take 1 tablet (0.1 mg total) by mouth daily. 11/09/17   Katha Hamming, MD  fluvoxaMINE (LUVOX) 50 MG tablet Take 1 tablet (50 mg total) by mouth at bedtime. Patient not taking: Reported on 11/07/2017 07/31/17   Haskell Riling, MD  lisinopril (PRINIVIL,ZESTRIL) 20 MG tablet Take 2 tablets (40 mg total) by mouth daily. 11/08/17   Katha Hamming, MD  metFORMIN (GLUCOPHAGE) 1000 MG tablet Take 1 tablet (1,000 mg total) by mouth 2 (two) times daily. 11/08/17   Katha Hamming, MD  traZODone (DESYREL) 50 MG tablet Take 1 tablet (50 mg total) by mouth at bedtime. 11/08/17   Katha Hamming, MD  Allergies Codeine; Erythromycin; and Sulfa antibiotics  Family History  Problem Relation Age of Onset  . Diabetes Mother   . CAD Mother   . Heart failure Father   . Diabetes Father     Social History Social History   Tobacco Use  . Smoking status: Current Every Day Smoker    Packs/day: 2.00  . Smokeless tobacco: Never Used  Substance Use Topics  . Alcohol use: Yes    Alcohol/week: 0.6 oz    Types: 1 Cans of beer per week    Comment: occassionally  . Drug use: Yes    Types: Marijuana    Comment: liquid  marijuana 2 weeks ago    Review of Systems  Constitutional: No fever/chills Eyes: No visual changes. ENT: No sore throat. Cardiovascular: Denies chest pain. Respiratory:  shortness of breath. Gastrointestinal: No abdominal pain.  No nausea, no vomiting.   Genitourinary: Negative for dysuria. Musculoskeletal: Left arm pain Skin: Negative for rash. Neurological: Headache, dizziness   ____________________________________________   PHYSICAL EXAM:  VITAL SIGNS: ED Triage Vitals  Enc Vitals Group     BP 11/07/17 0020 (!) 202/131     Pulse Rate 11/07/17 0020 97     Resp 11/07/17 0020 18     Temp 11/07/17 0020 98.6 F (37 C)     Temp Source 11/07/17 0020 Oral     SpO2 11/07/17 0020 96 %     Weight 11/07/17 0018 220 lb (99.8 kg)     Height 11/07/17 0018 6' (1.829 m)     Head Circumference --      Peak Flow --      Pain Score 11/07/17 0018 3     Pain Loc --      Pain Edu? --      Excl. in GC? --     Constitutional: Alert and oriented. Ill appearing and in moderate distress. Eyes: Conjunctivae are normal. PERRL. EOMI. Head: Atraumatic. Nose: No congestion/rhinnorhea. Mouth/Throat: Mucous membranes are moist.  Oropharynx non-erythematous. Cardiovascular: Normal rate, regular rhythm. Grossly normal heart sounds.  Good peripheral circulation. Respiratory: Normal respiratory effort.  No retractions. Mild, expiratory wheezing. Gastrointestinal: Soft and nontender. No distention.  Positive bowel sounds Musculoskeletal: No lower extremity tenderness nor edema.   Neurologic:  Normal speech and language.  Cranial nerves II through XII are grossly intact with no focal motor neuro deficit Skin:  Skin is warm, dry and intact.  Psychiatric: Mood and affect are normal.   ____________________________________________   LABS (all labs ordered are listed, but only abnormal results are displayed)  Labs Reviewed  CBC - Abnormal; Notable for the following components:      Result Value     WBC 12.6 (*)    All other components within normal limits  BASIC METABOLIC PANEL - Abnormal; Notable for the following components:   Sodium 131 (*)    Chloride 96 (*)    Glucose, Bld 262 (*)    Creatinine, Ser 1.36 (*)    All other components within normal limits  TROPONIN I - Abnormal; Notable for the following components:   Troponin I 0.06 (*)    All other components within normal limits  URINALYSIS, COMPLETE (UACMP) WITH MICROSCOPIC - Abnormal; Notable for the following components:   Color, Urine YELLOW (*)    APPearance CLEAR (*)    Glucose, UA >=500 (*)    Protein, ur 30 (*)    All other components within normal limits  TROPONIN I - Abnormal; Notable for the  following components:   Troponin I 0.07 (*)    All other components within normal limits  TROPONIN I - Abnormal; Notable for the following components:   Troponin I 0.06 (*)    All other components within normal limits  VALPROIC ACID LEVEL, FREE - Abnormal; Notable for the following components:   Valproic Acid, Free 2.7 (*)    All other components within normal limits  VALPROIC ACID LEVEL - Abnormal; Notable for the following components:   Valproic Acid Lvl 13 (*)    All other components within normal limits  GLUCOSE, CAPILLARY - Abnormal; Notable for the following components:   Glucose-Capillary 224 (*)    All other components within normal limits  GLUCOSE, CAPILLARY - Abnormal; Notable for the following components:   Glucose-Capillary 305 (*)    All other components within normal limits  HEMOGLOBIN A1C - Abnormal; Notable for the following components:   Hgb A1c MFr Bld 9.0 (*)    All other components within normal limits  GLUCOSE, CAPILLARY - Abnormal; Notable for the following components:   Glucose-Capillary 178 (*)    All other components within normal limits  BASIC METABOLIC PANEL - Abnormal; Notable for the following components:   Sodium 133 (*)    Glucose, Bld 188 (*)    Calcium 8.3 (*)    All other  components within normal limits  CBC - Abnormal; Notable for the following components:   WBC 11.2 (*)    RDW 14.8 (*)    All other components within normal limits  GLUCOSE, CAPILLARY - Abnormal; Notable for the following components:   Glucose-Capillary 175 (*)    All other components within normal limits  GLUCOSE, CAPILLARY - Abnormal; Notable for the following components:   Glucose-Capillary 219 (*)    All other components within normal limits  GLUCOSE, CAPILLARY - Abnormal; Notable for the following components:   Glucose-Capillary 220 (*)    All other components within normal limits  HIV ANTIBODY (ROUTINE TESTING)   ____________________________________________  EKG  ED ECG REPORT I, Rebecka Apley, the attending physician, personally viewed and interpreted this ECG.   Date: 11/07/2017  EKG Time: 0023  Rate: 101  Rhythm: sinus tachycardia  Axis: Normal  Intervals:none  ST&T Change: ST segment depression in leads II, III, aVF, V5 and V6, seen on previous EKG from December 2018.  ____________________________________________  RADIOLOGY  ED MD interpretation:  CXR: No acute disease  CT head: Unremarkable  Official radiology report(s): No results found.  ____________________________________________   PROCEDURES  Procedure(s) performed: please, see procedure note(s).  .Critical Care Performed by: Rebecka Apley, MD Authorized by: Rebecka Apley, MD   Critical care provider statement:    Critical care time (minutes):  30   Critical care start time:  11/21/2017 6:18 AM   Critical care end time:  11/21/2017 7:30 AM   Critical care time was exclusive of:  Separately billable procedures and treating other patients   Critical care was necessary to treat or prevent imminent or life-threatening deterioration of the following conditions: hypertensive urgency.   Critical care was time spent personally by me on the following activities:  Development of treatment  plan with patient or surrogate, discussions with consultants, evaluation of patient's response to treatment, examination of patient, obtaining history from patient or surrogate, ordering and performing treatments and interventions, ordering and review of laboratory studies, ordering and review of radiographic studies, pulse oximetry, re-evaluation of patient's condition and review of old charts  I assumed direction of critical care for this patient from another provider in my specialty: no      Critical Care performed: Yes, see critical care note(s)  ____________________________________________   INITIAL IMPRESSION / ASSESSMENT AND PLAN / ED COURSE  As part of my medical decision making, I reviewed the following data within the electronic MEDICAL RECORD NUMBER Notes from prior ED visits and [place] Controlled Substance Database   This is a 42 year old male who comes into the hospital today with some elevated blood pressure and dizziness.    My differential diagnosis includes intracranial hemorrhage, hypertensive urgency And acute coronary syndrome.  I did check some blood on the patient.  His white blood cell count is 12.6 with a BMP that shows a glucose is 262.  The patient's initial troponin was 0.06.  The patient had a CT of his head that was unremarkable as well as an unremarkable chest x-ray.  The patient's blood pressure was elevated so I did give him a dose of hydralazine as well as a 500 mm bolus of normal saline.  He also received a DuoNeb for some wheezing.  Given the patient's complaint as well as his blood work results I feel that the patient needs to be admitted to the hospital for hypertensive urgency.  The patient received a second dose of hydralazine which did improve his blood pressure to the 150 systolic.  He will be admitted.      ____________________________________________   FINAL CLINICAL IMPRESSION(S) / ED DIAGNOSES  Final diagnoses:  Dizziness  Hypertensive urgency      ED Discharge Orders        Ordered    cloNIDine (CATAPRES) 0.1 MG tablet  Daily     11/08/17 1045    amLODipine (NORVASC) 10 MG tablet  Daily     11/08/17 1045    atorvastatin (LIPITOR) 80 MG tablet  Daily     11/08/17 1045    lisinopril (PRINIVIL,ZESTRIL) 20 MG tablet  Daily     11/08/17 1045    traZODone (DESYREL) 50 MG tablet  Daily at bedtime     11/08/17 1045    ARIPiprazole (ABILIFY) 10 MG tablet  Daily     11/08/17 1045    metFORMIN (GLUCOPHAGE) 1000 MG tablet  2 times daily     11/08/17 1045    Home Health     11/08/17 1045    Face-to-face encounter (required for Medicare/Medicaid patients)    Comments:  I Katha HammingSnehalatha Konidena certify that this patient is under my care and that I, or a nurse practitioner or physician's assistant working with me, had a face-to-face encounter that meets the physician face-to-face encounter requirements with this patient on 11/08/2017. The encounter with the patient was in whole, or in part for the following medical condition(s) which is the primary reason for home Uncontrolled htn DMII Bipolar disorder   11/08/17 1045       Note:  This document was prepared using Dragon voice recognition software and may include unintentional dictation errors.    Rebecka ApleyWebster, Jonquil Stubbe P, MD 11/07/17 [phone]    Rebecka ApleyWebster, Ryane Konieczny P, MD 11/21/17 [phone]

## 2017-11-07 NOTE — ED Triage Notes (Signed)
Pt to triage via w/c with no distress noted; pt c/o left arm pain, dizziness and weakness for several days; has been off BP meds "for long time", recently restarting without relief of HTN

## 2017-11-08 LAB — CBC
HEMATOCRIT: 41.7 % (ref 40.0–52.0)
HEMOGLOBIN: 13.9 g/dL (ref 13.0–18.0)
MCH: 29 pg (ref 26.0–34.0)
MCHC: 33.4 g/dL (ref 32.0–36.0)
MCV: 86.9 fL (ref 80.0–100.0)
Platelets: 195 10*3/uL (ref 150–440)
RBC: 4.79 MIL/uL (ref 4.40–5.90)
RDW: 14.8 % — ABNORMAL HIGH (ref 11.5–14.5)
WBC: 11.2 10*3/uL — ABNORMAL HIGH (ref 3.8–10.6)

## 2017-11-08 LAB — BASIC METABOLIC PANEL
Anion gap: 7 (ref 5–15)
BUN: 17 mg/dL (ref 6–20)
CHLORIDE: 101 mmol/L (ref 101–111)
CO2: 25 mmol/L (ref 22–32)
Calcium: 8.3 mg/dL — ABNORMAL LOW (ref 8.9–10.3)
Creatinine, Ser: 1.09 mg/dL (ref 0.61–1.24)
GFR calc Af Amer: >60 mL/min (ref >60)
GFR calc non Af Amer: >60 mL/min (ref >60)
GLUCOSE: 188 mg/dL — AB (ref 65–99)
POTASSIUM: 3.8 mmol/L (ref 3.5–5.1)
Sodium: 133 mmol/L — ABNORMAL LOW (ref 135–145)

## 2017-11-08 LAB — GLUCOSE, CAPILLARY
Glucose-Capillary: 219 mg/dL — ABNORMAL HIGH (ref 65–99)
Glucose-Capillary: 220 mg/dL — ABNORMAL HIGH (ref 65–99)

## 2017-11-08 LAB — VALPROIC ACID LEVEL, FREE: VALPROIC ACID FREE: 2.7 ug/mL — AB (ref 6.0–22.0)

## 2017-11-08 LAB — HIV ANTIBODY (ROUTINE TESTING W REFLEX): HIV SCREEN 4TH GENERATION: NONREACTIVE

## 2017-11-08 MED ORDER — CLONIDINE HCL 0.1 MG PO TABS: 0.1000 mg | ORAL_TABLET | Freq: Every day | ORAL | 0 refills | Status: DC

## 2017-11-08 MED ORDER — LISINOPRIL 20 MG PO TABS: 40.0000 mg | ORAL_TABLET | Freq: Every day | ORAL | 0 refills | Status: DC

## 2017-11-08 MED ORDER — METFORMIN HCL 1000 MG PO TABS: 1000.0000 mg | ORAL_TABLET | Freq: Two times a day (BID) | ORAL | 0 refills | Status: DC

## 2017-11-08 MED ORDER — ARIPIPRAZOLE 10 MG PO TABS: 10.0000 mg | ORAL_TABLET | Freq: Every day | ORAL | 0 refills | Status: DC

## 2017-11-08 MED ORDER — TRAZODONE HCL 50 MG PO TABS: 50.0000 mg | ORAL_TABLET | Freq: Every day | ORAL | 0 refills | Status: DC

## 2017-11-08 MED ORDER — ATORVASTATIN CALCIUM 80 MG PO TABS: 80.0000 mg | ORAL_TABLET | Freq: Every day | ORAL | 0 refills | Status: DC

## 2017-11-08 MED ORDER — AMLODIPINE BESYLATE 10 MG PO TABS: 10.0000 mg | ORAL_TABLET | Freq: Every day | ORAL | 0 refills | Status: DC

## 2017-11-08 NOTE — Progress Notes (Signed)
Patient given discharge instructions with mother at bedside. Both verbalized understanding with no further questions. Informed that a home health nurse would be contacting them soon, mother very appreciative. Patient transported to family vehicle in stable condition.

## 2017-11-08 NOTE — Progress Notes (Signed)
IV taken out and tele monitor off. Patient's legal guardian, mother, notified of patient's discharge. Stated she would be here in about an hour. Patient notified. Will continue to monitor patient.

## 2017-11-08 NOTE — Plan of Care (Signed)

## 2017-11-08 NOTE — Care Management Obs Status (Signed)
MEDICARE OBSERVATION STATUS NOTIFICATION   Patient Details  Name: Henry Shaffer MRN: [id number] Date of Birth: September 30, 1975   Medicare Observation Status Notification Given:  Yes    Eber HongGreene, Perkins Molina R, RN 11/08/2017, 4:17 PM

## 2017-11-08 NOTE — Discharge Summary (Signed)
Henry Shaffer, is a 42 y.o. male  DOB [birth year]  MRN [id number].  Admission date:  11/07/2017  Admitting Physician  Cammy Copa, MD  Discharge Date:  11/08/2017   Primary MD  Anselm Jungling, MD  Recommendations for primary care physician for things to follow:   Follow with  PCP in 1 week   Admission Diagnosis  Dizziness [R42] Hypertensive urgency [I16.0]   Discharge Diagnosis  Dizziness [R42] Hypertensive urgency [I16.0]    Active Problems:   Hypertensive urgency      Past Medical History:  Diagnosis Date  . Anxiety   . Depression   . Diabetes (HCC)   . Hyperlipidemia   . Hypertension   . Schizophrenia [facility])     Past Surgical History:  Procedure Laterality Date  . CARDIAC CATHETERIZATION         History of present illness and  Hospital Course:     Kindly see H&P for history of present illness and admission details, please review complete Labs, Consult reports and Test reports for all details in brief  HPI  from the history and physical done on the day of admission 42 year old male patient with history of bipolar disorder, hypertension, diabetes mellitus type 2, anxiety came in because of Dizziness, found to have hypertensive urgency withBP around 202/131 when he came Hospital Course  #1. 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  He had a CT head which is unremarkable.  Admitted to telemetry, received as needed hydralazine in addition to his home dose amlodipine, lisinopril, I have added clonidine to his BP medicines.  Blood pressure improved, patient blood pressure today is 130/92.  Received clonidine 0.2 mg 1 time yesterday. Patient had a cardiac cath which was normal a year ago.  Not on beta-blockers  at home.  Patient does not want to take aspirin.  All medications are printed. #2. diabetes mellitus type 2: Patient received Lantus, sliding scale insulin with coverage but in the hospital, patient can resume metformin 1 g p.o. twice daily.  Has history of diabetic retinopathy. 3.  History of bipolar disorder, patient says that he takes Depakote 500 mg p.o. daily. 4.  History of anxiety Before discharging home with home health nurse for disease process management.  Patient lives with his mom who is 60 year old.  Discharge Condition:    Follow UP  Follow-up Information    Anselm Jungling, MD. Schedule an appointment as soon as possible for a visit in 1 week(s).   Specialty:  Internal Medicine Contact information: [id number] [facility] [place] [address] [phone]             Discharge Instructions  and  Discharge Medications     Discharge Instructions    Face-to-face encounter (required for Medicare/Medicaid patients)   Complete by:  As directed    I Katha Hamming certify that this patient is under my care and that I, or a nurse practitioner or physician's assistant working with me, had a face-to-face encounter that meets the physician face-to-face encounter requirements with this patient on 11/08/2017. The encounter with the patient was in whole, or in part for the following medical condition(s) which is the primary reason for home Uncontrolled htn DMII Bipolar disorder   The encounter with the patient was in whole, or in part, for the following medical condition, which is the primary reason for home health care:  whole   I certify  that, based on my findings, the following services are medically necessary home health services:  Nursing   Reason for Medically Necessary Home Health Services:  Skilled Nursing- Teaching of Disease Process/Symptom Management   My clinical findings support the need for the above services:  Unable to leave home safely  without assistance and/or assistive device   Further, I certify that my clinical findings support that this patient is homebound due to:  Mental confusion   Home Health   Complete by:  As directed    To provide the following care/treatments:  RN     Allergies as of 11/08/2017      Reactions   Codeine    Unknown. Pt's father reported this in the past, but pt doesn't know his reaction.   Erythromycin Other (See Comments)   Unknown. Pt's father reported this in the past, but pt doesn't know his reaction.   Sulfa Antibiotics    Unknown. Pt's father reported this in the past, but pt doesn't know his reaction.      Medication List    STOP taking these medications   aspirin 81 MG chewable tablet   paliperidone 156 MG/ML Susp injection Commonly known as:  INVEGA SUSTENNA     TAKE these medications   albuterol 108 (90 Base) MCG/ACT inhaler Commonly known as:  PROVENTIL HFA;VENTOLIN HFA Inhale 2 puffs into the lungs every 6 (six) hours as needed for wheezing or shortness of breath.   amLODipine 10 MG tablet Commonly known as:  NORVASC Take 1 tablet (10 mg total) by mouth daily.   ARIPiprazole 10 MG tablet Commonly known as:  ABILIFY Take 1 tablet (10 mg total) by mouth daily.   atorvastatin 80 MG tablet Commonly known as:  LIPITOR Take 1 tablet (80 mg total) by mouth daily.   cloNIDine 0.1 MG tablet Commonly known as:  CATAPRES Take 1 tablet (0.1 mg total) by mouth daily. Start taking on:  11/09/2017   divalproex 500 MG DR tablet Commonly known as:  DEPAKOTE Take 500 mg by mouth daily. What changed:  Another medication with the same name was removed. Continue taking this medication, and follow the directions you see here.   fluvoxaMINE 50 MG tablet Commonly known as:  LUVOX Take 1 tablet (50 mg total) by mouth at bedtime.   lisinopril 20 MG tablet Commonly known as:  PRINIVIL,ZESTRIL Take 2 tablets (40 mg total) by mouth daily.   metFORMIN 1000 MG tablet Commonly  known as:  GLUCOPHAGE Take 1 tablet (1,000 mg total) by mouth 2 (two) times daily.   traZODone 50 MG tablet Commonly known as:  DESYREL Take 1 tablet (50 mg total) by mouth at bedtime.         Diet and Activity recommendation: See Discharge Instructions above   Consults obtained -none   Major procedures and Radiology Reports - PLEASE review detailed and final reports for all details, in brief -     Dg Chest 2 View  Result Date: 11/07/2017 CLINICAL DATA:  Acute onset of left arm pain and dizziness. Generalized weakness. EXAM: CHEST - 2 VIEW COMPARISON:  Chest radiograph performed 02/13/2017 FINDINGS: The lungs are well-aerated. Pulmonary vascularity is at the upper limits of normal. There is no evidence of focal opacification, pleural effusion or pneumothorax. The heart is normal in size; the mediastinal contour is within normal limits. No acute osseous abnormalities are seen. IMPRESSION: No acute cardiopulmonary process seen. Electronically Signed   By: Roanna Raider M.D.   On: 11/07/2017  02:25   Ct Head Wo Contrast  Result Date: 11/07/2017 CLINICAL DATA:  Acute onset of dizziness and generalized weakness. Left arm pain. EXAM: CT HEAD WITHOUT CONTRAST TECHNIQUE: Contiguous axial images were obtained from the base of the skull through the vertex without intravenous contrast. COMPARISON:  None. FINDINGS: Brain: No evidence of acute infarction, hemorrhage, hydrocephalus, extra-axial collection or mass lesion/mass effect. The posterior fossa, including the cerebellum, brainstem and fourth ventricle, is within normal limits. The third and lateral ventricles, and basal ganglia are unremarkable in appearance. The cerebral hemispheres are symmetric in appearance, with normal gray-white differentiation. No mass effect or midline shift is seen. Vascular: No hyperdense vessel or unexpected calcification. Skull: There is no evidence of fracture; visualized osseous structures are unremarkable in  appearance. Sinuses/Orbits: The visualized portions of the orbits are within normal limits. The paranasal sinuses and mastoid air cells are well-aerated. Other: No significant soft tissue abnormalities are seen. IMPRESSION: Unremarkable noncontrast CT of the head. Electronically Signed   By: Roanna RaiderJeffery  Chang M.D.   On: 11/07/2017 02:32    Micro Results    No results found for this or any previous visit (from the past 240 hour(s)).     Today   Subjective:   Henry Shaffer today has no headache,no chest abdominal pain,no new weakness tingling or numbness, feels much better wants to go home today.  Objective:   Blood pressure (!) 130/92, pulse 77, temperature (!) 97.5 F (36.4 C), temperature source Oral, resp. rate 18, height 6' (1.829 m), weight 110.2 kg (243 lb), SpO2 95 %.   Intake/Output Summary (Last 24 hours) at 11/08/2017 1250 Last data filed at 11/08/2017 0408 Gross per 24 hour  Intake 750 ml  Output 800 ml  Net -50 ml    Exam Awake Alert, Oriented x 3, No new F.N deficits, Normal affect [place].AT,PERRAL Supple Neck,No JVD, No cervical lymphadenopathy appriciated.  Symmetrical Chest wall movement, Good air movement bilaterally, CTAB RRR,No Gallops,Rubs or new Murmurs, No Parasternal Heave +ve B.Sounds, Abd Soft, Non tender, No organomegaly appriciated, No rebound -guarding or rigidity. No Cyanosis, Clubbing or edema, No new Rash or bruise  Data Review   CBC w Diff:  Lab Results  Component Value Date   WBC 11.2 (H) 11/08/2017   HGB 13.9 11/08/2017   HCT 41.7 11/08/2017   PLT 195 11/08/2017    CMP:  Lab Results  Component Value Date   NA 133 (L) 11/08/2017   K 3.8 11/08/2017   CL 101 11/08/2017   CO2 25 11/08/2017   BUN 17 11/08/2017   CREATININE 1.09 11/08/2017   PROT 7.0 07/21/2017   ALBUMIN 4.3 07/21/2017   BILITOT 1.0 07/21/2017   ALKPHOS 73 07/21/2017   AST 25 07/21/2017   ALT 22 07/21/2017  .   Total Time in preparing paper work, data evaluation  and todays exam - 35 minutes  Katha HammingSnehalatha Adyson Vanburen M.D on 11/08/2017 at 12:50 PM    Note: This dictation was prepared with Dragon dictation along with smaller phrase technology. Any transcriptional errors that result from this process are unintentional.

## 2017-11-08 NOTE — Care Management CC44 (Signed)
Condition Code 44 Documentation Completed  Patient Details  Name: Henry Shaffer MRN: [id number] Date of Birth: 12/29/1975   Condition Code 44 given:  Yes Patient signature on Condition Code 44 notice:  Yes Documentation of 2 MD's agreement:  Yes Code 44 added to claim:  Yes    Eber HongGreene, Corrinna Karapetyan R, RN 11/08/2017, 4:17 PM

## 2017-11-08 NOTE — Care Management (Signed)
Patient for discharge home today and there is an order for home health nurse.  Patient rarely is able to leave his home due to anxiety.  He requires home health nurse to follow for medication management and administration concerns.  Patient lives with his mother who "is getting older" per patient.  Patient may also benefit from SW for community resources and transportation.  Agency preference is [facility]. [facility]

## 2017-11-08 NOTE — Plan of Care (Signed)
  Problem: Clinical Measurements: Goal: Ability to maintain clinical measurements within normal limits will improve Outcome: Progressing Goal: Diagnostic test results will improve Outcome: Progressing   

## 2017-11-10 MED ORDER — ARIPIPRAZOLE 10 MG TABLET
ORAL_TABLET | Freq: Every day | ORAL | 0 refills | 0 days | Status: CP
Start: 2017-11-10 — End: 2017-11-14

## 2017-11-13 ENCOUNTER — Telehealth: Payer: Self-pay

## 2017-11-13 NOTE — Telephone Encounter (Signed)
EMMI Follow-up: Received a VM from Henry GirtPatricia Shaffer on Friday, 11/10/17 and I returned her call this morning.  Explained our new call back system and checking to see if there were any concerns post discharge.  Said Mr. Henry Shaffer was doing okay and follow-up appointmeny had been made for 11/14/17. Explained he would receive a 2nd follow-up call and to respond to prompts if they have any concerns. No other needs at this time.

## 2017-11-14 ENCOUNTER — Encounter: Admit: 2017-11-14 | Discharge: 2017-11-15 | Payer: MEDICARE

## 2017-11-14 ENCOUNTER — Ambulatory Visit: Admit: 2017-11-14 | Discharge: 2017-11-15 | Payer: MEDICARE

## 2017-11-14 DIAGNOSIS — R631 Polydipsia: Principal | ICD-10-CM

## 2017-11-14 DIAGNOSIS — F250 Schizoaffective disorder, bipolar type: Secondary | ICD-10-CM

## 2017-11-14 DIAGNOSIS — M7662 Achilles tendinitis, left leg: Secondary | ICD-10-CM

## 2017-11-14 DIAGNOSIS — E119 Type 2 diabetes mellitus without complications: Principal | ICD-10-CM

## 2017-11-14 DIAGNOSIS — I10 Essential (primary) hypertension: Secondary | ICD-10-CM

## 2017-11-14 MED ORDER — ALBUTEROL SULFATE HFA 90 MCG/ACTUATION AEROSOL INHALER
Freq: Four times a day (QID) | RESPIRATORY_TRACT | 3 refills | 0.00000 days | Status: CP | PRN
Start: 2017-11-14 — End: 2017-12-26

## 2017-11-14 MED ORDER — AMLODIPINE 10 MG TABLET
ORAL_TABLET | Freq: Every day | ORAL | 3 refills | 0 days | Status: CP
Start: 2017-11-14 — End: 2017-12-26

## 2017-11-14 MED ORDER — ATORVASTATIN 80 MG TABLET
ORAL_TABLET | Freq: Every day | ORAL | 3 refills | 0 days | Status: CP
Start: 2017-11-14 — End: 2017-12-26

## 2017-11-14 MED ORDER — FLUTICASONE PROPIONATE 50 MCG/ACTUATION NASAL SPRAY,SUSPENSION
Freq: Every day | NASAL | 0 refills | 0.00000 days | Status: CP
Start: 2017-11-14 — End: 2017-12-11

## 2017-11-14 MED ORDER — LISINOPRIL 40 MG TABLET
ORAL_TABLET | Freq: Every day | ORAL | 3 refills | 0 days | Status: CP
Start: 2017-11-14 — End: 2017-12-26

## 2017-11-14 MED ORDER — ARIPIPRAZOLE 10 MG TABLET
ORAL_TABLET | Freq: Every day | ORAL | 3 refills | 0 days | Status: CP
Start: 2017-11-14 — End: 2017-12-26

## 2017-11-14 MED ORDER — METFORMIN 1,000 MG TABLET
ORAL_TABLET | Freq: Two times a day (BID) | ORAL | 3 refills | 0 days | Status: CP
Start: 2017-11-14 — End: 2017-12-26

## 2017-11-14 MED ORDER — DIVALPROEX ER 500 MG TABLET,EXTENDED RELEASE 24 HR
ORAL_TABLET | Freq: Every day | ORAL | 3 refills | 0 days | Status: CP
Start: 2017-11-14 — End: 2017-12-26

## 2017-11-21 ENCOUNTER — Encounter: Admit: 2017-11-21 | Discharge: 2017-11-22 | Payer: MEDICARE

## 2017-11-21 DIAGNOSIS — M7662 Achilles tendinitis, left leg: Principal | ICD-10-CM

## 2017-12-11 MED ORDER — FLUTICASONE PROPIONATE 50 MCG/ACTUATION NASAL SPRAY,SUSPENSION
5 refills | 0 days | Status: CP
Start: 2017-12-11 — End: 2018-10-22

## 2017-12-26 ENCOUNTER — Encounter: Admit: 2017-12-26 | Discharge: 2017-12-27 | Payer: MEDICARE

## 2017-12-26 DIAGNOSIS — I10 Essential (primary) hypertension: Secondary | ICD-10-CM

## 2017-12-26 DIAGNOSIS — F250 Schizoaffective disorder, bipolar type: Secondary | ICD-10-CM

## 2017-12-26 DIAGNOSIS — E119 Type 2 diabetes mellitus without complications: Secondary | ICD-10-CM

## 2017-12-26 DIAGNOSIS — H9201 Otalgia, right ear: Secondary | ICD-10-CM

## 2017-12-26 DIAGNOSIS — M7662 Achilles tendinitis, left leg: Principal | ICD-10-CM

## 2017-12-26 MED ORDER — ALBUTEROL SULFATE HFA 90 MCG/ACTUATION AEROSOL INHALER
Freq: Four times a day (QID) | RESPIRATORY_TRACT | 3 refills | 0.00000 days | Status: CP | PRN
Start: 2017-12-26 — End: 2019-03-19

## 2017-12-26 MED ORDER — TRAZODONE 50 MG TABLET
ORAL_TABLET | Freq: Every evening | ORAL | 3 refills | 0.00000 days | Status: CP
Start: 2017-12-26 — End: 2018-03-05

## 2017-12-26 MED ORDER — PALIPERIDONE PALMITATE 156 MG/ML INTRAMUSCULAR SYRINGE
INTRAMUSCULAR | 11 refills | 0 days | Status: CP
Start: 2017-12-26 — End: 2018-07-11

## 2017-12-26 MED ORDER — CLONIDINE HCL 0.1 MG TABLET
ORAL_TABLET | Freq: Two times a day (BID) | ORAL | 3 refills | 0.00000 days | Status: CP
Start: 2017-12-26 — End: 2018-06-11

## 2017-12-26 MED ORDER — ATORVASTATIN 80 MG TABLET
ORAL_TABLET | Freq: Every day | ORAL | 3 refills | 0.00000 days | Status: CP
Start: 2017-12-26 — End: 2018-02-26

## 2017-12-26 MED ORDER — OFLOXACIN 0.3 % EAR DROPS
Freq: Every day | OTIC | 0 refills | 0.00000 days | Status: CP
Start: 2017-12-26 — End: 2018-01-09

## 2017-12-26 MED ORDER — ARIPIPRAZOLE 10 MG TABLET
ORAL_TABLET | Freq: Every day | ORAL | 3 refills | 0.00000 days | Status: CP
Start: 2017-12-26 — End: 2018-03-05

## 2017-12-26 MED ORDER — AMLODIPINE 10 MG TABLET
ORAL_TABLET | Freq: Every day | ORAL | 3 refills | 0 days | Status: CP
Start: 2017-12-26 — End: 2018-06-11

## 2017-12-26 MED ORDER — METFORMIN 1,000 MG TABLET
ORAL_TABLET | Freq: Two times a day (BID) | ORAL | 3 refills | 0 days | Status: CP
Start: 2017-12-26 — End: 2018-03-05

## 2017-12-26 MED ORDER — LISINOPRIL 40 MG TABLET
ORAL_TABLET | Freq: Every day | ORAL | 3 refills | 0 days | Status: CP
Start: 2017-12-26 — End: 2018-03-05

## 2017-12-26 MED ORDER — DIVALPROEX ER 500 MG TABLET,EXTENDED RELEASE 24 HR
ORAL_TABLET | Freq: Every day | ORAL | 3 refills | 0 days | Status: CP
Start: 2017-12-26 — End: 2018-03-05

## 2018-01-09 ENCOUNTER — Encounter: Admit: 2018-01-09 | Discharge: 2018-01-10 | Payer: MEDICARE

## 2018-01-09 DIAGNOSIS — M7662 Achilles tendinitis, left leg: Secondary | ICD-10-CM

## 2018-01-09 DIAGNOSIS — H6093 Unspecified otitis externa, bilateral: Secondary | ICD-10-CM

## 2018-01-09 DIAGNOSIS — F250 Schizoaffective disorder, bipolar type: Secondary | ICD-10-CM

## 2018-01-09 DIAGNOSIS — I10 Essential (primary) hypertension: Principal | ICD-10-CM

## 2018-01-09 MED ORDER — OFLOXACIN 0.3 % EAR DROPS
Freq: Every day | OTIC | 0 refills | 0 days | Status: CP
Start: 2018-01-09 — End: 2018-01-19

## 2018-01-30 ENCOUNTER — Encounter: Admit: 2018-01-30 | Discharge: 2018-01-31 | Payer: MEDICARE

## 2018-01-30 DIAGNOSIS — E119 Type 2 diabetes mellitus without complications: Secondary | ICD-10-CM

## 2018-01-30 DIAGNOSIS — F250 Schizoaffective disorder, bipolar type: Secondary | ICD-10-CM

## 2018-01-30 DIAGNOSIS — I10 Essential (primary) hypertension: Principal | ICD-10-CM

## 2018-01-30 DIAGNOSIS — M7662 Achilles tendinitis, left leg: Secondary | ICD-10-CM

## 2018-02-26 MED ORDER — ATORVASTATIN 80 MG TABLET
ORAL_TABLET | Freq: Every day | ORAL | 3 refills | 0.00000 days
Start: 2018-02-26 — End: ?

## 2018-03-05 ENCOUNTER — Encounter: Admit: 2018-03-05 | Discharge: 2018-03-06 | Payer: MEDICARE

## 2018-03-05 DIAGNOSIS — E119 Type 2 diabetes mellitus without complications: Principal | ICD-10-CM

## 2018-03-05 DIAGNOSIS — F250 Schizoaffective disorder, bipolar type: Secondary | ICD-10-CM

## 2018-03-05 DIAGNOSIS — M7662 Achilles tendinitis, left leg: Secondary | ICD-10-CM

## 2018-03-05 DIAGNOSIS — I10 Essential (primary) hypertension: Secondary | ICD-10-CM

## 2018-03-05 MED ORDER — ARIPIPRAZOLE 10 MG TABLET
ORAL_TABLET | Freq: Every day | ORAL | 3 refills | 0.00000 days | Status: CP
Start: 2018-03-05 — End: 2018-04-09

## 2018-03-05 MED ORDER — LISINOPRIL 40 MG TABLET
ORAL_TABLET | Freq: Every day | ORAL | 3 refills | 0.00000 days | Status: CP
Start: 2018-03-05 — End: 2018-04-09

## 2018-03-05 MED ORDER — TRAZODONE 50 MG TABLET
ORAL_TABLET | Freq: Every evening | ORAL | 3 refills | 0 days | Status: SS
Start: 2018-03-05 — End: 2018-06-23

## 2018-03-05 MED ORDER — METFORMIN 1,000 MG TABLET
ORAL_TABLET | Freq: Two times a day (BID) | ORAL | 3 refills | 0 days | Status: CP
Start: 2018-03-05 — End: 2018-04-09

## 2018-03-05 MED ORDER — DIVALPROEX ER 500 MG TABLET,EXTENDED RELEASE 24 HR
ORAL_TABLET | Freq: Every day | ORAL | 3 refills | 0.00000 days | Status: CP
Start: 2018-03-05 — End: 2018-04-09

## 2018-04-09 ENCOUNTER — Encounter: Admit: 2018-04-09 | Discharge: 2018-04-09 | Payer: MEDICARE

## 2018-04-09 DIAGNOSIS — R42 Dizziness and giddiness: Principal | ICD-10-CM

## 2018-04-09 DIAGNOSIS — M7662 Achilles tendinitis, left leg: Secondary | ICD-10-CM

## 2018-04-09 DIAGNOSIS — R300 Dysuria: Secondary | ICD-10-CM

## 2018-04-09 DIAGNOSIS — I10 Essential (primary) hypertension: Secondary | ICD-10-CM

## 2018-04-09 MED ORDER — DIVALPROEX ER 500 MG TABLET,EXTENDED RELEASE 24 HR
ORAL_TABLET | Freq: Every day | ORAL | 3 refills | 0.00000 days | Status: CP
Start: 2018-04-09 — End: 2018-06-11

## 2018-04-09 MED ORDER — METFORMIN 1,000 MG TABLET
ORAL_TABLET | Freq: Two times a day (BID) | ORAL | 3 refills | 0.00000 days | Status: CP
Start: 2018-04-09 — End: 2018-06-11

## 2018-04-09 MED ORDER — ARIPIPRAZOLE 10 MG TABLET
ORAL_TABLET | Freq: Every day | ORAL | 3 refills | 0 days | Status: SS
Start: 2018-04-09 — End: 2018-06-23

## 2018-04-09 MED ORDER — LISINOPRIL 40 MG TABLET
ORAL_TABLET | Freq: Every day | ORAL | 3 refills | 0.00000 days | Status: CP
Start: 2018-04-09 — End: 2018-06-11

## 2018-06-11 ENCOUNTER — Encounter: Admit: 2018-06-11 | Discharge: 2018-06-12 | Payer: MEDICARE

## 2018-06-11 DIAGNOSIS — F250 Schizoaffective disorder, bipolar type: Secondary | ICD-10-CM

## 2018-06-11 DIAGNOSIS — E119 Type 2 diabetes mellitus without complications: Principal | ICD-10-CM

## 2018-06-11 DIAGNOSIS — I10 Essential (primary) hypertension: Secondary | ICD-10-CM

## 2018-06-11 DIAGNOSIS — F17200 Nicotine dependence, unspecified, uncomplicated: Secondary | ICD-10-CM

## 2018-06-11 DIAGNOSIS — R002 Palpitations: Secondary | ICD-10-CM

## 2018-06-11 DIAGNOSIS — R079 Chest pain, unspecified: Secondary | ICD-10-CM

## 2018-06-11 MED ORDER — AMLODIPINE 10 MG TABLET
ORAL_TABLET | Freq: Every day | ORAL | 3 refills | 0 days | Status: CP
Start: 2018-06-11 — End: 2019-06-11

## 2018-06-11 MED ORDER — METFORMIN 1,000 MG TABLET
ORAL_TABLET | Freq: Two times a day (BID) | ORAL | 3 refills | 0 days | Status: CP
Start: 2018-06-11 — End: 2018-07-11

## 2018-06-11 MED ORDER — DIVALPROEX ER 500 MG TABLET,EXTENDED RELEASE 24 HR
ORAL_TABLET | Freq: Every day | ORAL | 3 refills | 0 days | Status: CP
Start: 2018-06-11 — End: 2018-07-11

## 2018-06-11 MED ORDER — CLONIDINE HCL 0.1 MG TABLET
ORAL_TABLET | Freq: Two times a day (BID) | ORAL | 3 refills | 0 days | Status: CP
Start: 2018-06-11 — End: 2018-12-10

## 2018-06-11 MED ORDER — LISINOPRIL 40 MG TABLET
ORAL_TABLET | Freq: Every day | ORAL | 3 refills | 0.00000 days | Status: CP
Start: 2018-06-11 — End: 2018-07-11

## 2018-06-22 ENCOUNTER — Encounter: Admit: 2018-06-22 | Discharge: 2018-06-23 | Payer: MEDICARE

## 2018-06-23 MED ORDER — TRAZODONE 50 MG TABLET
ORAL_TABLET | Freq: Every evening | ORAL | 3 refills | 0 days
Start: 2018-06-23 — End: 2018-07-23

## 2018-06-23 MED ORDER — ARIPIPRAZOLE 10 MG TABLET
ORAL_TABLET | Freq: Every day | ORAL | 3 refills | 0.00000 days
Start: 2018-06-23 — End: 2018-07-11

## 2018-07-02 ENCOUNTER — Encounter: Admit: 2018-07-02 | Discharge: 2018-07-03 | Payer: MEDICARE

## 2018-07-02 DIAGNOSIS — G4700 Insomnia, unspecified: Secondary | ICD-10-CM

## 2018-07-02 DIAGNOSIS — E119 Type 2 diabetes mellitus without complications: Secondary | ICD-10-CM

## 2018-07-02 DIAGNOSIS — F250 Schizoaffective disorder, bipolar type: Secondary | ICD-10-CM

## 2018-07-02 DIAGNOSIS — F22 Delusional disorders: Secondary | ICD-10-CM

## 2018-07-02 DIAGNOSIS — Z09 Encounter for follow-up examination after completed treatment for conditions other than malignant neoplasm: Principal | ICD-10-CM

## 2018-07-02 DIAGNOSIS — I10 Essential (primary) hypertension: Secondary | ICD-10-CM

## 2018-07-02 MED ORDER — LANCETS
11 refills | 0 days | Status: CP
Start: 2018-07-02 — End: 2019-07-02

## 2018-07-02 MED ORDER — BLOOD-GLUCOSE METER KIT
11 refills | 0 days | Status: CP
Start: 2018-07-02 — End: 2019-07-02

## 2018-07-02 MED ORDER — BLOOD SUGAR DIAGNOSTIC STRIPS
ORAL_STRIP | 11 refills | 0 days | Status: CP
Start: 2018-07-02 — End: 2019-07-02

## 2018-07-03 ENCOUNTER — Emergency Department: Payer: Medicare HMO

## 2018-07-03 ENCOUNTER — Emergency Department
Admission: EM | Admit: 2018-07-03 | Discharge: 2018-07-03 | Disposition: A | Discharge status: Other Acute Inpatient Hospital | Payer: Medicare HMO | Attending: Emergency Medicine | Admitting: Emergency Medicine

## 2018-07-03 ENCOUNTER — Inpatient Hospital Stay
Admission: AD | Admit: 2018-07-03 | Discharge: 2018-07-10 | DRG: 885 | Disposition: A | Discharge status: Home / Self Care | Payer: Medicare HMO | Attending: Psychiatry | Admitting: Psychiatry

## 2018-07-03 ENCOUNTER — Other Ambulatory Visit: Payer: Self-pay

## 2018-07-03 DIAGNOSIS — E119 Type 2 diabetes mellitus without complications: Secondary | ICD-10-CM | POA: Diagnosis not present

## 2018-07-03 DIAGNOSIS — G4700 Insomnia, unspecified: Secondary | ICD-10-CM | POA: Diagnosis present

## 2018-07-03 DIAGNOSIS — F17211 Nicotine dependence, cigarettes, in remission: Secondary | ICD-10-CM | POA: Diagnosis present

## 2018-07-03 DIAGNOSIS — Z881 Allergy status to other antibiotic agents status: Secondary | ICD-10-CM

## 2018-07-03 DIAGNOSIS — I2510 Atherosclerotic heart disease of native coronary artery without angina pectoris: Secondary | ICD-10-CM | POA: Diagnosis present

## 2018-07-03 DIAGNOSIS — Z9114 Patient's other noncompliance with medication regimen: Secondary | ICD-10-CM | POA: Diagnosis not present

## 2018-07-03 DIAGNOSIS — H538 Other visual disturbances: Secondary | ICD-10-CM | POA: Diagnosis present

## 2018-07-03 DIAGNOSIS — M79601 Pain in right arm: Secondary | ICD-10-CM | POA: Diagnosis present

## 2018-07-03 DIAGNOSIS — F250 Schizoaffective disorder, bipolar type: Secondary | ICD-10-CM | POA: Diagnosis not present

## 2018-07-03 DIAGNOSIS — E785 Hyperlipidemia, unspecified: Secondary | ICD-10-CM | POA: Diagnosis present

## 2018-07-03 DIAGNOSIS — Z7984 Long term (current) use of oral hypoglycemic drugs: Secondary | ICD-10-CM | POA: Diagnosis not present

## 2018-07-03 DIAGNOSIS — Z885 Allergy status to narcotic agent status: Secondary | ICD-10-CM | POA: Diagnosis not present

## 2018-07-03 DIAGNOSIS — Z833 Family history of diabetes mellitus: Secondary | ICD-10-CM | POA: Diagnosis not present

## 2018-07-03 DIAGNOSIS — I10 Essential (primary) hypertension: Secondary | ICD-10-CM | POA: Diagnosis present

## 2018-07-03 DIAGNOSIS — F418 Other specified anxiety disorders: Secondary | ICD-10-CM | POA: Diagnosis present

## 2018-07-03 DIAGNOSIS — F200 Paranoid schizophrenia: Secondary | ICD-10-CM | POA: Diagnosis not present

## 2018-07-03 DIAGNOSIS — Z79899 Other long term (current) drug therapy: Secondary | ICD-10-CM | POA: Diagnosis not present

## 2018-07-03 DIAGNOSIS — R4182 Altered mental status, unspecified: Secondary | ICD-10-CM | POA: Insufficient documentation

## 2018-07-03 DIAGNOSIS — Z915 Personal history of self-harm: Secondary | ICD-10-CM | POA: Diagnosis not present

## 2018-07-03 DIAGNOSIS — F251 Schizoaffective disorder, depressive type: Secondary | ICD-10-CM

## 2018-07-03 DIAGNOSIS — M7989 Other specified soft tissue disorders: Secondary | ICD-10-CM | POA: Diagnosis present

## 2018-07-03 DIAGNOSIS — Z882 Allergy status to sulfonamides status: Secondary | ICD-10-CM

## 2018-07-03 DIAGNOSIS — F17210 Nicotine dependence, cigarettes, uncomplicated: Secondary | ICD-10-CM | POA: Insufficient documentation

## 2018-07-03 DIAGNOSIS — Z7951 Long term (current) use of inhaled steroids: Secondary | ICD-10-CM | POA: Diagnosis not present

## 2018-07-03 DIAGNOSIS — Z8249 Family history of ischemic heart disease and other diseases of the circulatory system: Secondary | ICD-10-CM

## 2018-07-03 DIAGNOSIS — Z79890 Hormone replacement therapy (postmenopausal): Secondary | ICD-10-CM

## 2018-07-03 DIAGNOSIS — H9190 Unspecified hearing loss, unspecified ear: Secondary | ICD-10-CM | POA: Diagnosis present

## 2018-07-03 DIAGNOSIS — J45909 Unspecified asthma, uncomplicated: Secondary | ICD-10-CM | POA: Insufficient documentation

## 2018-07-03 DIAGNOSIS — E1165 Type 2 diabetes mellitus with hyperglycemia: Secondary | ICD-10-CM | POA: Diagnosis present

## 2018-07-03 DIAGNOSIS — Z046 Encounter for general psychiatric examination, requested by authority: Secondary | ICD-10-CM | POA: Diagnosis present

## 2018-07-03 DIAGNOSIS — R9431 Abnormal electrocardiogram [ECG] [EKG]: Secondary | ICD-10-CM | POA: Diagnosis present

## 2018-07-03 LAB — CBC
HEMATOCRIT: 42.7 % (ref 39.0–52.0)
Hemoglobin: 14.3 g/dL (ref 13.0–17.0)
MCH: 28.5 pg (ref 26.0–34.0)
MCHC: 33.5 g/dL (ref 30.0–36.0)
MCV: 85.1 fL (ref 80.0–100.0)
Platelets: 258 10*3/uL (ref 150–400)
RBC: 5.02 MIL/uL (ref 4.22–5.81)
RDW: 13.1 % (ref 11.5–15.5)
WBC: 9.8 10*3/uL (ref 4.0–10.5)
nRBC: 0 % (ref 0.0–0.2)

## 2018-07-03 LAB — COMPREHENSIVE METABOLIC PANEL
ALK PHOS: 68 U/L (ref 38–126)
ALT: 28 U/L (ref 0–44)
AST: 18 U/L (ref 15–41)
Albumin: 4.7 g/dL (ref 3.5–5.0)
Anion gap: 9 (ref 5–15)
BUN: 13 mg/dL (ref 6–20)
CALCIUM: 9.1 mg/dL (ref 8.9–10.3)
CHLORIDE: 101 mmol/L (ref 98–111)
CO2: 27 mmol/L (ref 22–32)
CREATININE: 1.07 mg/dL (ref 0.61–1.24)
GFR calc Af Amer: >60 mL/min (ref >60)
GFR calc non Af Amer: >60 mL/min (ref >60)
Glucose, Bld: 189 mg/dL — ABNORMAL HIGH (ref 70–99)
Potassium: 4.1 mmol/L (ref 3.5–5.1)
SODIUM: 137 mmol/L (ref 135–145)
Total Bilirubin: 1 mg/dL (ref 0.3–1.2)
Total Protein: 7.7 g/dL (ref 6.5–8.1)

## 2018-07-03 LAB — URINE DRUG SCREEN, QUALITATIVE (ARMC ONLY)
Amphetamines, Ur Screen: NOT DETECTED
BARBITURATES, UR SCREEN: NOT DETECTED
Benzodiazepine, Ur Scrn: NOT DETECTED
CANNABINOID 50 NG, UR ~~LOC~~: NOT DETECTED
COCAINE METABOLITE, UR ~~LOC~~: NOT DETECTED
MDMA (Ecstasy)Ur Screen: NOT DETECTED
Methadone Scn, Ur: NOT DETECTED
OPIATE, UR SCREEN: NOT DETECTED
PHENCYCLIDINE (PCP) UR S: NOT DETECTED
Tricyclic, Ur Screen: NOT DETECTED

## 2018-07-03 LAB — TROPONIN I: Troponin I: 0.03 ng/mL (ref <0.03)

## 2018-07-03 LAB — ETHANOL: Alcohol, Ethyl (B): <10 mg/dL (ref <10)

## 2018-07-03 MED ORDER — TRAZODONE HCL 50 MG PO TABS: 50.0000 mg | ORAL_TABLET | Freq: Every day | ORAL | Status: DC

## 2018-07-03 MED ORDER — FLUVOXAMINE MALEATE 50 MG PO TABS
50.0000 mg | ORAL_TABLET | Freq: Every day | ORAL | Status: DC
  Administered 2018-07-04 – 2018-07-09 (×6): 50 mg via ORAL
  Filled 2018-07-03 (×6): qty 1

## 2018-07-03 MED ORDER — AMLODIPINE BESYLATE 5 MG PO TABS
10.0000 mg | ORAL_TABLET | Freq: Every day | ORAL | Status: DC
  Administered 2018-07-04 – 2018-07-10 (×7): 10 mg via ORAL
  Filled 2018-07-03 (×7): qty 2

## 2018-07-03 MED ORDER — DIVALPROEX SODIUM 500 MG PO DR TAB
500.0000 mg | DELAYED_RELEASE_TABLET | Freq: Every day | ORAL | Status: DC
  Administered 2018-07-04: 500 mg via ORAL
  Filled 2018-07-03: qty 1

## 2018-07-03 MED ORDER — FLUVOXAMINE MALEATE 50 MG PO TABS
50.0000 mg | ORAL_TABLET | Freq: Every day | ORAL | Status: DC
  Administered 2018-07-03: 50 mg via ORAL
  Filled 2018-07-03: qty 1

## 2018-07-03 MED ORDER — HYDROXYZINE HCL 50 MG PO TABS
50.0000 mg | ORAL_TABLET | Freq: Three times a day (TID) | ORAL | Status: DC | PRN
  Administered 2018-07-06 – 2018-07-09 (×5): 50 mg via ORAL
  Filled 2018-07-03 (×5): qty 1

## 2018-07-03 MED ORDER — TRAZODONE HCL 50 MG PO TABS
50.0000 mg | ORAL_TABLET | Freq: Every day | ORAL | Status: DC
  Administered 2018-07-03: 50 mg via ORAL
  Filled 2018-07-03: qty 1

## 2018-07-03 MED ORDER — DIVALPROEX SODIUM 500 MG PO DR TAB: 500.0000 mg | DELAYED_RELEASE_TABLET | Freq: Every day | ORAL | Status: DC

## 2018-07-03 MED ORDER — MAGNESIUM HYDROXIDE 400 MG/5ML PO SUSP
30.0000 mL | Freq: Every day | ORAL | Status: DC | PRN
  Administered 2018-07-06: 30 mL via ORAL
  Filled 2018-07-03: qty 30

## 2018-07-03 MED ORDER — AMLODIPINE BESYLATE 5 MG PO TABS
10.0000 mg | ORAL_TABLET | Freq: Every day | ORAL | Status: DC
  Administered 2018-07-03: 10 mg via ORAL
  Filled 2018-07-03 (×2): qty 2

## 2018-07-03 MED ORDER — LISINOPRIL 20 MG PO TABS
40.0000 mg | ORAL_TABLET | Freq: Every day | ORAL | Status: DC
  Administered 2018-07-04 – 2018-07-10 (×7): 40 mg via ORAL
  Filled 2018-07-03 (×7): qty 2

## 2018-07-03 MED ORDER — ATORVASTATIN CALCIUM 20 MG PO TABS
80.0000 mg | ORAL_TABLET | Freq: Every day | ORAL | Status: DC
  Administered 2018-07-03: 80 mg via ORAL
  Filled 2018-07-03: qty 4

## 2018-07-03 MED ORDER — LISINOPRIL 20 MG PO TABS
40.0000 mg | ORAL_TABLET | Freq: Every day | ORAL | Status: DC
  Administered 2018-07-03: 40 mg via ORAL
  Filled 2018-07-03 (×2): qty 4

## 2018-07-03 MED ORDER — CLONIDINE HCL 0.1 MG PO TABS: 0.1000 mg | ORAL_TABLET | Freq: Every day | ORAL | Status: DC

## 2018-07-03 MED ORDER — CLONIDINE HCL 0.1 MG PO TABS
0.2000 mg | ORAL_TABLET | Freq: Once | ORAL | Status: AC
  Administered 2018-07-03: 0.2 mg via ORAL
  Filled 2018-07-03: qty 2

## 2018-07-03 MED ORDER — ACETAMINOPHEN 325 MG PO TABS: 650.0000 mg | ORAL_TABLET | Freq: Four times a day (QID) | ORAL | Status: DC | PRN

## 2018-07-03 MED ORDER — METFORMIN HCL 500 MG PO TABS
1000.0000 mg | ORAL_TABLET | Freq: Two times a day (BID) | ORAL | Status: DC
Start: 2018-07-03 — End: 2018-07-03
  Administered 2018-07-03: 1000 mg via ORAL
  Filled 2018-07-03: qty 2

## 2018-07-03 MED ORDER — CLONIDINE HCL 0.1 MG PO TABS
0.1000 mg | ORAL_TABLET | Freq: Every day | ORAL | Status: DC
  Administered 2018-07-04: 0.1 mg via ORAL
  Filled 2018-07-03: qty 1

## 2018-07-03 MED ORDER — ALUM & MAG HYDROXIDE-SIMETH 200-200-20 MG/5ML PO SUSP: 30.0000 mL | ORAL | Status: DC | PRN

## 2018-07-03 MED ORDER — METFORMIN HCL 500 MG PO TABS
1000.0000 mg | ORAL_TABLET | Freq: Two times a day (BID) | ORAL | Status: DC
  Administered 2018-07-04 – 2018-07-10 (×13): 1000 mg via ORAL
  Filled 2018-07-03 (×13): qty 2

## 2018-07-03 MED ORDER — ATORVASTATIN CALCIUM 20 MG PO TABS
80.0000 mg | ORAL_TABLET | Freq: Every day | ORAL | Status: DC
  Administered 2018-07-04 – 2018-07-09 (×6): 80 mg via ORAL
  Filled 2018-07-03 (×5): qty 4

## 2018-07-03 NOTE — ED Triage Notes (Signed)
Pt here to ER with [employer]. Pt under IVC. Papers read that pt has psych hx, is compliant with his medications but has been speaking in a language family cannot understand. Mother concerned for wellbeing. No legal guardian name listed. Pt alert to self, time, disoriented to situation.

## 2018-07-03 NOTE — ED Notes (Signed)
TTS here to see patient. 

## 2018-07-03 NOTE — ED Notes (Signed)
Patient is to be admitted to [facility] BMU by Dr. Toni Amendlapacs.  Attending Physician will be Dr. Jennet MaduroPucilowska.   Patient has been [facility] to room 310, [facility] Eston Esterslubkola.   Intake Paper Work has been signed and placed on patient chart.  ER staff is aware of the admission:  ER Secretary     ER MD   Jillyn HiddenGary Patient's Nurse   Nettie ElmSylvia  Patient Access.

## 2018-07-03 NOTE — ED Notes (Signed)
Pt being reviewed [facility]. MR[facility]  [facility] for the charge nurse to review and provide bed assignment.      Cheryl FlashNicole Demari Kropp, MS, NCC, LPCA Therapeutic Triage Specialist

## 2018-07-03 NOTE — ED Notes (Signed)
IVC/ Consult completed by Dr. Toni Amendlapacs/ Pending Disposition

## 2018-07-03 NOTE — ED Notes (Signed)
Hourly rounding reveals patient in room. No complaints, stable, in no acute distress. Q15 minute rounds and monitoring via Security Cameras to continue. 

## 2018-07-03 NOTE — ED Notes (Signed)
Report to include Situation, Background, Assessment, and Recommendations received from Amy B. RN. Patient alert and oriented, warm and dry, in no acute distress. Patient denies SI, HI, AVH and pain. Patient made aware of Q15 minute rounds and security cameras for their safety. Patient instructed to come to me with needs or concerns. 

## 2018-07-03 NOTE — ED Notes (Signed)
Hourly rounding reveals patient sleeping in room. No complaints, stable, in no acute distress. Q15 minute rounds and monitoring via Security Cameras to continue. 

## 2018-07-03 NOTE — ED Provider Notes (Signed)
[facility] Emergency Department Provider Note  ____________________________________________  Time seen: Approximately 4:48 PM  I have reviewed the triage vital signs and the nursing notes.   HISTORY  Chief Complaint Psychiatric Evaluation  Level 5 Caveat: Portions of the History and Physical including HPI and review of systems are unable to be completely obtained due to patient being a poor historian    HPI Henry Shaffer is a 42 y.o. male with a history of diabetes hypertension schizophrenia and depression who is sent to the ED under involuntary commitment today  due to bizarre behavior including speaking incoherently.  Patient denies SI.  He has trouble responding to questions in a straightforward manner and following the narrative of our conversation.     Past Medical History:  Diagnosis Date  . Anxiety   . Depression   . Diabetes (HCC)   . Hyperlipidemia   . Hypertension   . Schizophrenia [facility](HCC)      Patient Active Problem List   Diagnosis Date Noted  . Hypertensive urgency 11/07/2017  . Schizoaffective disorder, depressive type (HCC) 07/22/2017  . Asthma 02/14/2017  . HTN (hypertension) 02/13/2017  . Dyslipidemia 02/13/2017  . Diabetes (HCC) 02/13/2017  . Tobacco use disorder 02/13/2017  . Schizoaffective disorder, bipolar type (HCC) 06/29/2012  . Decreased vision 05/08/2012  . Conductive hearing loss, middle ear 11/02/2011     Past Surgical History:  Procedure Laterality Date  . CARDIAC CATHETERIZATION       Prior to Admission medications   Medication Sig Start Date End Date Taking? Authorizing Provider  albuterol (PROVENTIL HFA;VENTOLIN HFA) 108 (90 Base) MCG/ACT inhaler Inhale 2 puffs into the lungs every 6 (six) hours as needed for wheezing or shortness of breath.     [provider]  amLODipine (NORVASC) 10 MG tablet Take 1 tablet (10 mg total) by mouth daily. 11/08/17   Katha HammingKonidena, Snehalatha, MD  ARIPiprazole  (ABILIFY) 10 MG tablet Take 1 tablet (10 mg total) by mouth daily. 11/08/17   Katha HammingKonidena, Snehalatha, MD  atorvastatin (LIPITOR) 80 MG tablet Take 1 tablet (80 mg total) by mouth daily. 11/08/17   Katha HammingKonidena, Snehalatha, MD  cloNIDine (CATAPRES) 0.1 MG tablet Take 1 tablet (0.1 mg total) by mouth daily. 11/09/17   Katha HammingKonidena, Snehalatha, MD  divalproex (DEPAKOTE) 500 MG DR tablet Take 500 mg by mouth daily. 10/11/17   [provider]  fluvoxaMINE (LUVOX) 50 MG tablet Take 1 tablet (50 mg total) by mouth at bedtime. Patient not taking: Reported on 11/07/2017 07/31/17   Haskell RilingMcNew, Holly R, MD  lisinopril (PRINIVIL,ZESTRIL) 20 MG tablet Take 2 tablets (40 mg total) by mouth daily. 11/08/17   Katha HammingKonidena, Snehalatha, MD  metFORMIN (GLUCOPHAGE) 1000 MG tablet Take 1 tablet (1,000 mg total) by mouth 2 (two) times daily. 11/08/17   Katha HammingKonidena, Snehalatha, MD  traZODone (DESYREL) 50 MG tablet Take 1 tablet (50 mg total) by mouth at bedtime. 11/08/17   Katha HammingKonidena, Snehalatha, MD     Allergies Codeine; Erythromycin; and Sulfa antibiotics   Family History  Problem Relation Age of Onset  . Diabetes Mother   . CAD Mother   . Heart failure Father   . Diabetes Father     Social History Social History   Tobacco Use  . Smoking status: Current Every Day Smoker    Packs/day: 2.00  . Smokeless tobacco: Never Used  Substance Use Topics  . Alcohol use: Yes    Alcohol/week: 1.0 standard drinks    Types: 1 Cans of beer per  week    Comment: occassionally  . Drug use: Yes    Types: Marijuana    Comment: liquid marijuana 2 weeks ago    Review of Systems  Constitutional:   No fever or chills.  . Cardiovascular:   No chest pain or syncope. Respiratory:   No dyspnea or cough. Gastrointestinal:   Negative for abdominal pain, vomiting and diarrhea.  Musculoskeletal:   Chronic bilateral leg shoulder and neck pain.  No recent injuries.  Attributes it to Invega injections. All other systems reviewed and are negative  except as documented above in ROS and HPI.  ____________________________________________   PHYSICAL EXAM:  VITAL SIGNS: ED Triage Vitals  Enc Vitals Group     BP 07/03/18 1508 (!) 180/122     Pulse Rate 07/03/18 1508 (!) 102     Resp 07/03/18 1508 18     Temp 07/03/18 1508 98.4 F (36.9 C)     Temp Source 07/03/18 1508 Oral     SpO2 07/03/18 1508 100 %     Weight 07/03/18 1509 242 lb 8.1 oz (110 kg)     Height 07/03/18 1509 6' (1.829 m)     Head Circumference --      Peak Flow --      Pain Score 07/03/18 1508 7     Pain Loc --      Pain Edu? --      Excl. in GC? --     Vital signs reviewed, nursing assessments reviewed.   Constitutional:   Alert and oriented. Non-toxic appearance. Eyes:   Conjunctivae are normal. EOMI.  No nystagmus ENT      Head:   Normocephalic and atraumatic.      Nose:   No congestion/rhinnorhea.       Mouth/Throat:   MMM, no pharyngeal erythema. No peritonsillar mass.       Neck:   No meningismus. Full ROM. Hematological/Lymphatic/Immunilogical:   No cervical lymphadenopathy. Cardiovascular:   RRR. Symmetric bilateral radial and DP pulses.  No murmurs. Cap refill less than 2 seconds. Respiratory:   Normal respiratory effort without tachypnea/retractions. Breath sounds are clear and equal bilaterally. No wheezes/rales/rhonchi. Gastrointestinal:   Soft and nontender. Non distended.  No rebound, rigidity, or guarding.  Musculoskeletal:   Normal range of motion in all extremities. No joint effusions.  No lower extremity tenderness.  No edema.  No wounds Neurologic:   Slow speech.  Disorganized thought process Motor grossly intact. Skin:    Skin is warm, dry and intact. No rash noted.  No petechiae, purpura, or bullae.  ____________________________________________    LABS (pertinent positives/negatives) (all labs ordered are listed, but only abnormal results are displayed) Labs Reviewed  TROPONIN I - Abnormal; Notable for the following  components:      Result Value   Troponin I 0.03 (*)    All other components within normal limits  COMPREHENSIVE METABOLIC PANEL - Abnormal; Notable for the following components:   Glucose, Bld 189 (*)    All other components within normal limits  CBC  ETHANOL  URINE DRUG SCREEN, QUALITATIVE ([facility] ONLY)   ____________________________________________   EKG    ____________________________________________    RADIOLOGY  Ct Head Wo Contrast  Result Date: 07/03/2018 CLINICAL DATA:  Altered mental status with elevated blood pressure EXAM: CT HEAD WITHOUT CONTRAST TECHNIQUE: Contiguous axial images were obtained from the base of the skull through the vertex without intravenous contrast. COMPARISON:  11/07/2017 FINDINGS: Brain: No acute territorial infarction, hemorrhage or intracranial mass.  The ventricles are nonenlarged. Vascular: No hyperdense vessels.  No unexpected calcification Skull: Normal. Negative for fracture or focal lesion. Sinuses/Orbits: No acute finding. Other: None IMPRESSION: Negative non contrasted CT appearance of the brain. Electronically Signed   By: Jasmine Pang M.D.   On: 07/03/2018 16:11    ____________________________________________   PROCEDURES Procedures  ____________________________________________  DIFFERENTIAL DIAGNOSIS   Acute psychosis, subdural hematoma, cerebral hemorrhage  CLINICAL IMPRESSION / ASSESSMENT AND PLAN / ED COURSE  Pertinent labs & imaging results that were available during my care of the patient were reviewed by me and considered in my medical decision making (see chart for details).    Patient presents with likely worsening of his underlying psychiatric disease.  With his elevated blood pressure, poor history, obtain a CT scan of the head to evaluate for intracranial hemorrhage related to hypertension or undisclosed trauma.  Check labs.  Psychiatry Dr. Toni Amend seeing the patient immediately upon arrival to the ED treatment  bed.  Clinical Course as of Jul 04 1647  Tue Jul 03, 2018  1633 Labs unremarkable.  CT scan of the head is negative for any bleeding or acute infarcts or other acute disease.  He has elevated blood pressure, but questionable compliance with his medication regimen.  Patient given clonidine for now.  I will continue his home medications.  Consult note reviewed from psychiatry, notes they plan for inpatient psychiatric care which seems appropriate.  Patient is medically stable at this time to proceed with psychiatric management.   [PS]    Clinical Course User Index [PS] Sharman Cheek, MD     ____________________________________________   FINAL CLINICAL IMPRESSION(S) / ED DIAGNOSES    Final diagnoses:  Schizoaffective disorder, depressive type [facility])     ED Discharge Orders    None      Portions of this note were generated with dragon dictation software. Dictation errors may occur despite best attempts at proofreading.    Sharman Cheek, MD 07/03/18 [phone]

## 2018-07-03 NOTE — ED Notes (Signed)
EDP made aware of troponin 0.03

## 2018-07-03 NOTE — ED Notes (Signed)
Pt changed into behavioral policy clothing by this RN and Vernona RiegerLaura, EDT. White and grey socks, plaid soft pants, tan shirt, moccasin shoes, beige hat placed in belonging bag.

## 2018-07-03 NOTE — ED Notes (Signed)
Spoke with Dr. Scotty CourtStafford via telephone regarding pt hypertension at time of triage and recent admission for hypertensive urgency with elevated troponin per chart-states to add on troponin lab work and patient can still go to QUAD bed.

## 2018-07-03 NOTE — ED Notes (Signed)
Pt calm and cooperative. Pt denies SI/HI. Denies AVH, but states, "My vision and my mind is blurry."   Pt oriented to unit and given water.  Maintained on 15 minute checks and observation by security camera for safety.
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
## 2018-07-04 DIAGNOSIS — F200 Paranoid schizophrenia: Secondary | ICD-10-CM

## 2018-07-04 DIAGNOSIS — I10 Essential (primary) hypertension: Secondary | ICD-10-CM

## 2018-07-04 LAB — LIPID PANEL
CHOL/HDL RATIO: 2.4 ratio
Cholesterol: 70 mg/dL (ref 0–200)
HDL: 29 mg/dL — ABNORMAL LOW (ref >40)
LDL Cholesterol: 23 mg/dL (ref 0–99)
Triglycerides: 88 mg/dL (ref <150)
VLDL: 18 mg/dL (ref 0–40)

## 2018-07-04 LAB — HEMOGLOBIN A1C
Hgb A1c MFr Bld: 8.7 % — ABNORMAL HIGH (ref 4.8–5.6)
Mean Plasma Glucose: 202.99 mg/dL

## 2018-07-04 LAB — VALPROIC ACID LEVEL

## 2018-07-04 LAB — AMMONIA: Ammonia: 24 umol/L (ref 9–35)

## 2018-07-04 LAB — TSH: TSH: 1.777 u[IU]/mL (ref 0.350–4.500)

## 2018-07-04 MED ORDER — TRAZODONE HCL 50 MG PO TABS
50.0000 mg | ORAL_TABLET | Freq: Every evening | ORAL | Status: DC | PRN
  Administered 2018-07-06 – 2018-07-09 (×5): 50 mg via ORAL
  Filled 2018-07-04 (×6): qty 1

## 2018-07-04 MED ORDER — DIVALPROEX SODIUM ER 500 MG PO TB24
500.0000 mg | ORAL_TABLET | Freq: Every day | ORAL | Status: DC
  Administered 2018-07-05 – 2018-07-06 (×2): 500 mg via ORAL
  Filled 2018-07-04 (×2): qty 1

## 2018-07-04 MED ORDER — CLONIDINE HCL 0.1 MG PO TABS
0.1000 mg | ORAL_TABLET | Freq: Two times a day (BID) | ORAL | Status: DC
  Administered 2018-07-04 – 2018-07-05 (×2): 0.1 mg via ORAL
  Filled 2018-07-04 (×2): qty 1

## 2018-07-04 MED ORDER — CLONIDINE HCL 0.1 MG PO TABS
0.1000 mg | ORAL_TABLET | Freq: Two times a day (BID) | ORAL | Status: DC | PRN
  Administered 2018-07-04: 0.1 mg via ORAL
  Filled 2018-07-04: qty 1

## 2018-07-04 NOTE — Progress Notes (Signed)
D - Report received from Jillyn HiddenGary in the [facility] Emergency Department at 2300. Patient received on the unit at 2340. Skin assessment completed with Kori, MHT. No abnormalities found during skin assessment. No contraband found during assessment. Patient was pleasant but tired during assessment. Patient asked what brought him in to the hospital and the patient responded, "my mind has just been misty." Patient was oriented to the unit and his room. Patient was given a sandwhich tray.   A - Patient was given education. Patient was encouraged to attend groups and be compliant with medication and procedures on the unit. Patient being monitored Q 15 minutes for safety per unit protocol.   R - Patient didn't have any medications due this evening. Patient remains safe on the unit at this time.

## 2018-07-04 NOTE — Tx Team (Signed)
Initial Treatment Plan 07/04/2018 3:25 AM Henry KluverElliot R Hollett RUE:[id number]:[id number]    PATIENT STRESSORS: Marital or family conflict Medication change or noncompliance   PATIENT STRENGTHS: Motivation for treatment/growth Supportive family/friends   PATIENT IDENTIFIED PROBLEMS: Psychosis  Anxiety  Depression                 DISCHARGE CRITERIA:  Motivation to continue treatment in a less acute level of care Verbal commitment to aftercare and medication compliance  PRELIMINARY DISCHARGE PLAN: Outpatient therapy Return to previous living arrangement  PATIENT/FAMILY INVOLVEMENT: This treatment plan has been presented to and reviewed with the patient, Henry Shaffer.  The patient has been given the opportunity to ask questions and make suggestions.  Henry RicksMatthew T Dresden Lozito, RN 07/04/2018, 3:25 AM

## 2018-07-04 NOTE — Plan of Care (Signed)
Patient is newly admitted to the unit and hasn't had time to progress.   Problem: Education: Goal: Knowledge of  General Education information/materials will improve Outcome: Not Progressing Goal: Emotional status will improve Outcome: Not Progressing Goal: Mental status will improve Outcome: Not Progressing Goal: Verbalization of understanding the information provided will improve Outcome: Not Progressing   Problem: Safety: Goal: Periods of time without injury will increase Outcome: Not Progressing   Problem: Education: Goal: Will be free of psychotic symptoms Outcome: Not Progressing Goal: Knowledge of the prescribed therapeutic regimen will improve Outcome: Not Progressing   Problem: Safety: Goal: Ability to redirect hostility and anger into socially appropriate behaviors will improve Outcome: Not Progressing Goal: Ability to remain free from injury will improve Outcome: Not Progressing

## 2018-07-04 NOTE — Progress Notes (Signed)
D- Patient alert and oriented. Patient presents in a pleasant mood on assessment stating that he slept good last night and he had no major complaints to voice to this Clinical research associatewriter. Patient rated his depression and anxiety a "8/10" stating that "it's getting close to Christmas and the people I've lost" and "just want to study, I love to read" is why he's feeling this way. Patient also states that "I just want everybody to be happy around me". Patient denies SI, HI, AVH, and pain at this time. Patient's goals for today is to "go to groups and get group therapy".  A- Scheduled medications administered to patient, per MD orders. Support and encouragement provided.  Routine safety checks conducted every 15 minutes.  Patient informed to notify staff with problems or concerns.  R- No adverse drug reactions noted. Patient contracts for safety at this time. Patient compliant with medications and treatment plan. Patient receptive, calm, and cooperative. Patient interacts well with others on the unit.  Patient remains safe at this time.
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
## 2018-07-04 NOTE — Plan of Care (Signed)
Pt. Denies si/hi/avh, verbally is able to contract for safety. Pt. Reports he can remain safe while on the unit. Pt. Observations appropriate and no signs of responding to internal stimuli or bizarre behavior.   Problem: Safety: Goal: Periods of time without injury will increase Outcome: Progressing   Problem: Education: Goal: Will be free of psychotic symptoms Outcome: Progressing   Problem: Safety: Goal: Ability to remain free from injury will improve Outcome: Progressing

## 2018-07-04 NOTE — Progress Notes (Signed)
CSW attempted to contact mother regarding information that pt has legal guardian.  Unable to reach mother or leave message.  CSW spoke to Dr Flora Lipps'Neal who did speak to mother today who reports that pt does not have a legal guardian.   Garner NashGregory Keasha Malkiewicz, MSW, LCSW Clinical Social Worker 07/04/2018 3:49 PM

## 2018-07-04 NOTE — Plan of Care (Signed)
Patient verbalizes understanding/has been in compliance with his prescribed therapeutic regimen as well as the general information that's been provided to him and has not voiced any further questions or concerns at this time. Patient denies SI/HI/AVH, however, he rates his depression and anxiety an "8/10" stating that "it's getting close to Christmas and the people I've lost" and "I just want to study/ I love reading" is why he's feeling this way. Patient has the ability to redirect hostility and anger into appropriate behaviors. Patient has been free from injury and remains safe on the unit at this time.  Problem: Education: Goal: Knowledge of [facility] Education information/materials will improve Outcome: Progressing Goal: Emotional status will improve Outcome: Progressing Goal: Mental status will improve Outcome: Progressing Goal: Verbalization of understanding the information provided will improve Outcome: Progressing   Problem: Safety: Goal: Periods of time without injury will increase Outcome: Progressing   Problem: Education: Goal: Will be free of psychotic symptoms Outcome: Progressing Goal: Knowledge of the prescribed therapeutic regimen will improve Outcome: Progressing   Problem: Safety: Goal: Ability to redirect hostility and anger into socially appropriate behaviors will improve Outcome: Progressing Goal: Ability to remain free from injury will improve Outcome: Progressing

## 2018-07-04 NOTE — Progress Notes (Signed)
Patient's BP continues to be elevated. This Clinical research associatewriter notified Pucilowska, MD, and was instructed to administer PRN Clonidine.

## 2018-07-04 NOTE — Progress Notes (Signed)
Recreation Therapy Notes   Date: 07/04/2018  Time: 9:30 pm   Location: Craft Room   Behavioral response: N/A   Intervention Topic: Emotions  Discussion/Intervention: Patient did not attend group.   Clinical Observations/Feedback:  Patient did not attend group.   [name] LRT/CTRS        Henry Shaffer 07/04/2018 11:13 AM 

## 2018-07-04 NOTE — Progress Notes (Signed)
Recreation Therapy Notes   Date: 07/04/2018  Time: 9:30 am  Location: Craft Room  Behavioral response: Appropriate  Intervention Topic: Emotions   Discussion/Intervention:  Group content on today was focused on emotions. The group identified what emotions are and why it is important to have emotions. Patients expressed some positive and negative emotions. Individuals gave some past experiences on how they normally dealt with emotions in the past. The group described some positive ways to deal with emotions in the future. Patients participated in the intervention "Name the Megan SalonSong" where individuals were given a chance to experience different emotions.  Clinical Observations/Feedback:  Patient came to group and defined emotions as how you feel. He stated he takes deep breaths to deal with his emotions. Individual participated in group and was social with peers and staff while participating in the intervention.  Vernella Niznik LRT/CTRS         Rheba Diamond 07/04/2018 11:53 AM

## 2018-07-04 NOTE — BHH Group Notes (Signed)
Overcoming Obstacles  07/04/2018 1PM  Type of Therapy and Topic:  Group Therapy:  Overcoming Obstacles  Participation Level:  Did Not Attend    Description of Group:    In this group patients will be encouraged to explore what they see as obstacles to their own wellness and recovery. They will be guided to discuss their thoughts, feelings, and behaviors related to these obstacles. The group will process together ways to cope with barriers, with attention given to specific choices patients can make. Each patient will be challenged to identify changes they are motivated to make in order to overcome their obstacles. This group will be process-oriented, with patients participating in exploration of their own experiences as well as giving and receiving support and challenge from other group members.   Therapeutic Goals: 1. Patient will identify personal and current obstacles as they relate to admission. 2. Patient will identify barriers that currently interfere with their wellness or overcoming obstacles.  3. Patient will identify feelings, thought process and behaviors related to these barriers. 4. Patient will identify two changes they are willing to make to overcome these obstacles:      Summary of Patient Progress     Therapeutic Modalities:   Cognitive Behavioral Therapy Solution Focused Therapy Motivational Interviewing Relapse Prevention Therapy    Henry Shaffer, MSW, LCSW 07/04/2018 3:53 PM

## 2018-07-04 NOTE — Progress Notes (Signed)
Recreation Therapy Notes  INPATIENT RECREATION THERAPY ASSESSMENT  Patient Details Name: Henry Shaffer MRN: [id number] DOB: 06-19-1976 Today's Date: 07/04/2018       Information Obtained From: Patient  Able to Participate in Assessment/Interview: Yes  Patient Presentation: Responsive  Reason for Admission (Per Patient): Active Symptoms, Med Non-Compliance  Patient Stressors:    Coping Skills:   Deep Breathing  Leisure Interests (2+):  Games - Board games  Frequency of Recreation/Participation: Chief Executive OfficerMonthly  Awareness of Community Resources:  Yes  Community Resources:  [employer]Church, Engineering geologistLibrary, [employer]  Current Use: No  If no, Barriers?: Transportation  Expressed Interest in State Street CorporationCommunity Resource Information:    [place]  Patient Main Form of Transportation: Other (Comment)(My Mom)  Patient Strengths:  Care about people, I wish people well   Patient Identified Areas of Improvement:  Not being lonely  Patient Goal for Hospitalization:  To get better and be peaceful  Current SI (including self-harm):  No  Current HI:  No  Current AVH: No  Staff Intervention Plan: Group Attendance, Collaborate with Interdisciplinary Treatment Team  Consent to Intern Participation: N/A  Evelyn Moch 07/04/2018, 2:44 PM

## 2018-07-04 NOTE — Progress Notes (Signed)
Patient's BP is still elevated, even after two different doses of Clonidine. This Clinical research associatewriter notified MD, Pucilowska at [phone] and she stated that she would review his chart and add changes if necessary.

## 2018-07-04 NOTE — BHH Group Notes (Signed)
BHH Group Notes:  (Nursing/MHT/Case Management/Adjunct)  Date:  07/04/2018  Time:  10:03 PM  Type of Therapy:  Group Therapy  Participation Level:  Active  Participation Quality:  Appropriate  Affect:  Appropriate  Cognitive:  Alert  Insight:  Good  Engagement in Group:  Engaged  Modes of Intervention:  Support  Summary of Progress/Problems:  Mayra NeerJackie L Kenon Delashmit 07/04/2018, 10:03 PM

## 2018-07-04 NOTE — BHH Group Notes (Signed)
B[facility] Group Notes:  (Nursing/MHT/Case Management/Adjunct)  Date:  07/04/2018  Time:  9:54 AM  Type of Therapy:  Psychoeducational Skills  Participation Level:  Active  Participation Quality:  Appropriate and Sharing  Affect:  Appropriate  Cognitive:  Alert, Appropriate and Oriented  Insight:  Appropriate and Improving  Engagement in Group:  Supportive  Modes of Intervention:  Activity, Clarification, Education, Exploration and Orientation  Summary of Progress/Problems:  [name] 07/04/2018, 9:54 AM

## 2018-07-04 NOTE — BH Assessment (Addendum)
Assessment Note  Henry Shaffer is an 42 y.o. male. With a history of Schizophrenia Disorder. Pt currently lives at home with extended family. Pt IVC'd by his mother say that he has been speaking in an unknown language which she claims to be Hebrew, talking about blood, apparently not at his baseline.  [facility] saw him today, Pt confirm medication noncompliance. Pt. denies any suicidal ideation, plan or intent. Pt. denies the presence of any auditory or visual hallucinations at this time. Patient denies any other medical complaints. Pt us unable to answer several evaluation questions stating that he that he feels  confused. Patient presents as blunted, delayed and somewhat paranoid. He denies any current drug or alcohol use. A behavioral health assessment has been completed including evaluation of the patient, collecting collateral history:, reviewing available medical/clinic records, evaluating his unique risk and protective factors, and discussing treatment recommendations.    The patient does meet admission criteria at this time. This was explained to the pt, who voiced understanding.      Diagnosis: Schizophrenia   Past Medical History:  Past Medical History:  Diagnosis Date  . Anxiety   . Depression   . Diabetes (HCC)   . Hyperlipidemia   . Hypertension   . Schizophrenia [facility](HCC)     Past Surgical History:  Procedure Laterality Date  . CARDIAC CATHETERIZATION      Family History:  Family History  Problem Relation Age of Onset  . Diabetes Mother   . CAD Mother   . Heart failure Father   . Diabetes Father     Social History:  reports that he has been smoking. He has been smoking about 2.00 packs per day. He has never used smokeless tobacco. He reports that he drinks about 1.0 standard drinks of alcohol per week. He reports that he has current or past drug history. Drug: Marijuana.  Additional Social History:     CIWA: CIWA-Ar BP: (!) 159/104 Pulse Rate: 85 COWS:     Allergies:  Allergies  Allergen Reactions  . Codeine Other (See Comments)    Unknown. Pt's father reported this in the past, but pt doesn't know his reaction.  . Erythromycin Other (See Comments)    Unknown. Pt's father reported this in the past, but pt doesn't know his reaction.  . Sulfa Antibiotics Other (See Comments)    Unknown. Pt's father reported this in the past, but pt doesn't know his reaction.  . Sulfasalazine Other (See Comments)    Unknown. Pt's father reported this in the past, but pt doesn't know his reaction.    Home Medications:  Medications Prior to Admission  Medication Sig Dispense Refill  . albuterol (PROVENTIL HFA;VENTOLIN HFA) 108 (90 Base) MCG/ACT inhaler Inhale 2 puffs into the lungs every 6 (six) hours as needed for wheezing or shortness of breath.     Marland Kitchen. amLODipine (NORVASC) 10 MG tablet Take 1 tablet (10 mg total) by mouth daily. 30 tablet 0  . ARIPiprazole (ABILIFY) 10 MG tablet Take 1 tablet (10 mg total) by mouth daily. 30 tablet 0  . atorvastatin (LIPITOR) 80 MG tablet Take 1 tablet (80 mg total) by mouth daily. 30 tablet 0  . cloNIDine (CATAPRES) 0.1 MG tablet Take 1 tablet (0.1 mg total) by mouth daily. (Patient taking differently: Take 0.1 mg by mouth 2 (two) times daily. ) 60 tablet 0  . divalproex (DEPAKOTE) 500 MG DR tablet Take 500 mg by mouth daily.  2  . fluticasone (FLONASE) 50 MCG/ACT nasal  spray Place 1 spray into both nostrils daily.  5  . fluvoxaMINE (LUVOX) 50 MG tablet Take 1 tablet (50 mg total) by mouth at bedtime. (Patient not taking: Reported on 11/07/2017) 30 tablet 0  . INVEGA SUSTENNA 156 MG/ML SUSY injection Inject 156 mg into the muscle every 28 (twenty-eight) days.  9  . lisinopril (PRINIVIL,ZESTRIL) 20 MG tablet Take 2 tablets (40 mg total) by mouth daily. 30 tablet 0  . metFORMIN (GLUCOPHAGE) 1000 MG tablet Take 1 tablet (1,000 mg total) by mouth 2 (two) times daily. 60 tablet 0  . traZODone (DESYREL) 50 MG tablet Take 1 tablet  (50 mg total) by mouth at bedtime. 30 tablet 0    OB/GYN Status:  No LMP for male patient.  General Assessment Data Living Arrangements: Other relatives     Crisis Care Plan Living Arrangements: Other relatives                    ADLScreening [facility]) Patient's cognitive ability adequate to safely complete daily activities?: Yes Patient able to express need for assistance with ADLs?: Yes Independently performs ADLs?: Yes (appropriate for developmental age)        ADL Screening (condition at time of admission) Patient's cognitive ability adequate to safely complete daily activities?: Yes Is the patient deaf or have difficulty hearing?: No Does the patient have difficulty seeing, even when wearing glasses/contacts?: No Does the patient have difficulty concentrating, remembering, or making decisions?: No Patient able to express need for assistance with ADLs?: Yes Does the patient have difficulty dressing or bathing?: No Independently performs ADLs?: Yes (appropriate for developmental age) Does the patient have difficulty walking or climbing stairs?: No Weakness of Legs: None Weakness of Arms/Hands: None  Home Assistive Devices/Equipment Home Assistive Devices/Equipment: None  Therapy Consults (therapy consults require a physician order) PT Evaluation Needed: No OT Evalulation Needed: No SLP Evaluation Needed: No Abuse/Neglect Assessment (Assessment to be complete while patient is alone) Abuse/Neglect Assessment Can Be Completed: Yes Physical Abuse: Denies Verbal Abuse: Denies Sexual Abuse: Denies Exploitation of patient/patient's resources: Denies Self-Neglect: Denies Values / Beliefs Cultural Requests During Hospitalization: None Spiritual Requests During Hospitalization: None Consults Spiritual Care Consult Needed: No Social Work Consult Needed: No Merchant navy officer (For Healthcare) Does Patient Have a Medical Advance Directive?:  No Would patient like information on creating a medical advance directive?: No - Patient declined Nutrition Screen- [facility] Adult/WL/AP Patient's home diet: Carb modified, Other (Comment)(low sodium) Has the patient recently lost weight without trying?: No Has the patient been eating poorly because of a decreased appetite?: No Malnutrition Screening Tool Score: 0        Disposition:     On Site Evaluation by:   Reviewed with Physician:    Asa Saunas 07/04/2018 6:04 AM

## 2018-07-04 NOTE — Progress Notes (Signed)
D: Pt. Upon this writer's arrival to the unit the pt. is observed sitting in the day room around peers appropriately. Pt. Somewhat Sociable, but does not interact much overall. Pt. During this writer's assessment is isolative and withdrawn to this room preparing for bed. Pt. Reports to this writer that, "I'm really depressed", reporting depression as, "high". Pt. States, "I feel like I just have been presented with so many disadvantages" and "people make me depressed". Pt. When asked if he has been feeling anxious reports, "a little". Pt. Affect Is flat and depressed. Pt. Denies si/hi/avh, contracting verbally for safety. Pt. Denies physical pain. Pt. Up throughout the night not sleeping consistently. Pt. Gets up for water or a book to read frequently. No other behavioral concerns to report.     A: Q x 15 minute observation checks were completed for safety. Patient was provided with education, but needs reinforcement.  Patient was given/offered medications per orders. Patient  was encourage to attend groups, participate in unit activities and continue with plan of care. Pt. Chart and plans of care reviewed. Pt. Given support and encouragement.   R: Patient is complaint with medication and unit procedures with direction and encouragement. Pt. Attends snack time and group appropriately.             Precautionary checks every 15 minutes for safety maintained, room free of safety hazards, patient sustains no injury or falls during this shift. Will endorse care to next shift.

## 2018-07-04 NOTE — Tx Team (Addendum)
Interdisciplinary Treatment and Diagnostic Plan Update  07/04/2018 Time of Session: 1052 Henry Shaffer MRN: [id number]  Principal Diagnosis: Schizoaffective disorder, bipolar type [facility])  Secondary Diagnoses: Principal Problem:   Schizoaffective disorder, bipolar type ([place])   Current Medications:  Current Facility-Administered Medications  Medication Dose Route Frequency Provider Last Rate Last Dose  . acetaminophen (TYLENOL) tablet 650 mg  650 mg Oral Q6H PRN Clapacs, John T, MD      . alum & mag hydroxide-simeth (MAALOX/MYLANTA) 200-200-20 MG/5ML suspension 30 mL  30 mL Oral Q4H PRN Clapacs, John T, MD      . amLODipine (NORVASC) tablet 10 mg  10 mg Oral Daily Clapacs, Madie Reno, MD   10 mg at 07/06/18 0650  . atorvastatin (LIPITOR) tablet 80 mg  80 mg Oral q1800 Clapacs, Madie Reno, MD   80 mg at 07/05/18 1737  . chlorthalidone (HYGROTON) tablet 25 mg  25 mg Oral Daily Loletha Grayer, MD   25 mg at 07/06/18 0831  . cloNIDine (CATAPRES) tablet 0.1 mg  0.1 mg Oral BID PRN Tennis Ship, MD   0.1 mg at 07/04/18 1734  . cloNIDine (CATAPRES) tablet 0.2 mg  0.2 mg Oral BID Loletha Grayer, MD   0.2 mg at 07/06/18 0649  . divalproex (DEPAKOTE ER) 24 hr tablet 500 mg  500 mg Oral Daily Tennis Ship, MD   500 mg at 07/06/18 0831  . fluvoxaMINE (LUVOX) tablet 50 mg  50 mg Oral QHS Clapacs, John T, MD   50 mg at 07/05/18 2111  . hydrOXYzine (ATARAX/VISTARIL) tablet 50 mg  50 mg Oral TID PRN Clapacs, Madie Reno, MD   50 mg at 07/06/18 0210  . insulin aspart (novoLOG) injection 0-5 Units  0-5 Units Subcutaneous QHS Wieting, Richard, MD      . insulin aspart (novoLOG) injection 0-9 Units  0-9 Units Subcutaneous TID WC Loletha Grayer, MD   2 Units at 07/06/18 0831  . linagliptin (TRADJENTA) tablet 5 mg  5 mg Oral Daily Loletha Grayer, MD   5 mg at 07/06/18 1941  . lisinopril (PRINIVIL,ZESTRIL) tablet 40 mg  40 mg Oral Daily Clapacs, Madie Reno, MD   40 mg at 07/06/18 0649  . magnesium hydroxide (MILK  OF MAGNESIA) suspension 30 mL  30 mL Oral Daily PRN Clapacs, Madie Reno, MD   30 mL at 07/06/18 0835  . metFORMIN (GLUCOPHAGE) tablet 1,000 mg  1,000 mg Oral BID WC Clapacs, Madie Reno, MD   1,000 mg at 07/06/18 0831  . traZODone (DESYREL) tablet 50 mg  50 mg Oral QHS PRN Tennis Ship, MD   50 mg at 07/06/18 0210   PTA Medications: Medications Prior to Admission  Medication Sig Dispense Refill Last Dose  . albuterol (PROVENTIL HFA;VENTOLIN HFA) 108 (90 Base) MCG/ACT inhaler Inhale 2 puffs into the lungs every 6 (six) hours as needed for wheezing or shortness of breath.    Unknown at PRN  . amLODipine (NORVASC) 10 MG tablet Take 1 tablet (10 mg total) by mouth daily. 30 tablet 0 Unknown at Unknown  . ARIPiprazole (ABILIFY) 10 MG tablet Take 1 tablet (10 mg total) by mouth daily. 30 tablet 0 Unknown at Unknown  . atorvastatin (LIPITOR) 80 MG tablet Take 1 tablet (80 mg total) by mouth daily. 30 tablet 0 Unknown at Unknown  . cloNIDine (CATAPRES) 0.1 MG tablet Take 1 tablet (0.1 mg total) by mouth daily. (Patient taking differently: Take 0.1 mg by mouth 2 (two) times daily. ) 60 tablet 0 Unknown  at Unknown  . divalproex (DEPAKOTE) 500 MG DR tablet Take 500 mg by mouth daily.  2 Unknown at Unknown  . fluticasone (FLONASE) 50 MCG/ACT nasal spray Place 1 spray into both nostrils daily.  5 Unknown at Unknown  . fluvoxaMINE (LUVOX) 50 MG tablet Take 1 tablet (50 mg total) by mouth at bedtime. (Patient not taking: Reported on 11/07/2017) 30 tablet 0 Not Taking at Unknown time  . INVEGA SUSTENNA 156 MG/ML SUSY injection Inject 156 mg into the muscle every 28 (twenty-eight) days.  9 Unknown at Unknown  . lisinopril (PRINIVIL,ZESTRIL) 20 MG tablet Take 2 tablets (40 mg total) by mouth daily. 30 tablet 0 Unknown at Unknown  . metFORMIN (GLUCOPHAGE) 1000 MG tablet Take 1 tablet (1,000 mg total) by mouth 2 (two) times daily. 60 tablet 0 Unknown at Unknown  . traZODone (DESYREL) 50 MG tablet Take 1 tablet (50 mg total)  by mouth at bedtime. 30 tablet 0 Unknown at Unknown    Patient Stressors: Marital or family conflict Medication change or noncompliance  Patient Strengths: Motivation for treatment/growth Supportive family/friends  Treatment Modalities: Medication Management, Group therapy, Case management,  1 to 1 session with clinician, Psychoeducation, Recreational therapy.   Physician Treatment Plan for Primary Diagnosis: Schizoaffective disorder, bipolar type ([place]) Long Term Goal(s): Improvement in symptoms so as ready for discharge Improvement in symptoms so as ready for discharge   Short Term Goals: Ability to identify changes in lifestyle to reduce recurrence of condition will improve Ability to verbalize feelings will improve Compliance with prescribed medications will improve Ability to identify changes in lifestyle to reduce recurrence of condition will improve Ability to verbalize feelings will improve Compliance with prescribed medications will improve  Medication Management: Evaluate patient's response, side effects, and tolerance of medication regimen.  Therapeutic Interventions: 1 to 1 sessions, Unit Group sessions and Medication administration.  Evaluation of Outcomes: Not Met  Physician Treatment Plan for Secondary Diagnosis: Principal Problem:   Schizoaffective disorder, bipolar type ([place])  Long Term Goal(s): Improvement in symptoms so as ready for discharge Improvement in symptoms so as ready for discharge   Short Term Goals: Ability to identify changes in lifestyle to reduce recurrence of condition will improve Ability to verbalize feelings will improve Compliance with prescribed medications will improve Ability to identify changes in lifestyle to reduce recurrence of condition will improve Ability to verbalize feelings will improve Compliance with prescribed medications will improve     Medication Management: Evaluate patient's response, side effects, and tolerance of  medication regimen.  Therapeutic Interventions: 1 to 1 sessions, Unit Group sessions and Medication administration.  Evaluation of Outcomes: Not Met   RN Treatment Plan for Primary Diagnosis: Schizoaffective disorder, bipolar type ([place]) Long Term Goal(s): Knowledge of disease and therapeutic regimen to maintain health will improve  Short Term Goals: Ability to identify and develop effective coping behaviors will improve and Compliance with prescribed medications will improve  Medication Management: RN will administer medications as ordered by provider, will assess and evaluate patient's response and provide education to patient for prescribed medication. RN will report any adverse and/or side effects to prescribing provider.  Therapeutic Interventions: 1 on 1 counseling sessions, Psychoeducation, Medication administration, Evaluate responses to treatment, Monitor vital signs and CBGs as ordered, Perform/monitor CIWA, COWS, AIMS and Fall Risk screenings as ordered, Perform wound care treatments as ordered.  Evaluation of Outcomes: Not Met   LCSW Treatment Plan for Primary Diagnosis: Schizoaffective disorder, bipolar type ([place]) Long Term Goal(s): Safe transition to appropriate  next level of care at discharge, Engage patient in therapeutic group addressing interpersonal concerns.  Short Term Goals: Engage patient in aftercare planning with referrals and resources, Increase social support and Increase skills for wellness and recovery  Therapeutic Interventions: Assess for all discharge needs, 1 to 1 time with Social worker, Explore available resources and support systems, Assess for adequacy in community support network, Educate family and significant other(s) on suicide prevention, Complete Psychosocial Assessment, Interpersonal group therapy.  Evaluation of Outcomes: Not Met   Progress in Treatment: Attending groups: Yes. Participating in groups: Yes. Taking medication as prescribed:  Yes. Toleration medication: Yes. Family/Significant other contact made: No, will contact:  when given permission Patient understands diagnosis: Yes. Discussing patient identified problems/goals with staff: Yes. Medical problems stabilized or resolved: Yes. Denies suicidal/homicidal ideation: Yes. Issues/concerns per patient self-inventory: No. Other: none  New problem(s) identified: No, Describe:  none  New Short Term/Long Term Goal(s):  Patient Goals:  "get better, live peacefully with my family"  Discharge Plan or Barriers:   Reason for Continuation of Hospitalization: Delusions  Hallucinations Medication stabilization  Estimated Length of Stay: 3-5 days.  Recreational Therapy: Patient Stressors: N/A Patient Goal: Patient will identify 2 ways of improving communication post d/c within 5 recreation therapy group sessions   Attendees: Patient:Henry Shaffer 07/04/2018   Physician: Dr Audie Box, MD 07/04/2018   Nursing: Polly Cobia, RN 07/04/2018   RN Care Manager: 07/04/2018   Social Worker: Lurline Idol, LCSW 07/04/2018   Recreational Therapist: Isaias Sakai. Outlaw CTRS, LRT 07/04/2018   Other:  07/04/2018   Other:  07/04/2018   Other: 07/04/2018       Scribe for Treatment Team: Joanne Chars, LCSW 07/06/2018 9:16 AM

## 2018-07-05 ENCOUNTER — Encounter: Payer: Self-pay | Admitting: Internal Medicine

## 2018-07-05 LAB — GLUCOSE, CAPILLARY
Glucose-Capillary: 124 mg/dL — ABNORMAL HIGH (ref 70–99)
Glucose-Capillary: 112 mg/dL — ABNORMAL HIGH (ref 70–99)

## 2018-07-05 MED ORDER — INSULIN ASPART 100 UNIT/ML ~~LOC~~ SOLN
0.0000 [IU] | Freq: Three times a day (TID) | SUBCUTANEOUS | Status: DC
  Administered 2018-07-05 – 2018-07-06 (×2): 1 [IU] via SUBCUTANEOUS
  Administered 2018-07-06 – 2018-07-09 (×8): 2 [IU] via SUBCUTANEOUS
  Filled 2018-07-05 (×6): qty 1

## 2018-07-05 MED ORDER — CHLORTHALIDONE 25 MG PO TABS
25.0000 mg | ORAL_TABLET | Freq: Every day | ORAL | Status: DC
  Administered 2018-07-05 – 2018-07-10 (×6): 25 mg via ORAL
  Filled 2018-07-05 (×7): qty 1

## 2018-07-05 MED ORDER — CLONIDINE HCL 0.1 MG PO TABS
0.2000 mg | ORAL_TABLET | Freq: Two times a day (BID) | ORAL | Status: DC
  Administered 2018-07-05 – 2018-07-10 (×10): 0.2 mg via ORAL
  Filled 2018-07-05 (×10): qty 2

## 2018-07-05 MED ORDER — INSULIN ASPART 100 UNIT/ML ~~LOC~~ SOLN: 3.0000 [IU] | Freq: Three times a day (TID) | SUBCUTANEOUS | Status: DC

## 2018-07-05 MED ORDER — LINAGLIPTIN 5 MG PO TABS
5.0000 mg | ORAL_TABLET | Freq: Every day | ORAL | Status: DC
  Administered 2018-07-06 – 2018-07-10 (×5): 5 mg via ORAL
  Filled 2018-07-05 (×5): qty 1

## 2018-07-05 MED ORDER — INSULIN ASPART 100 UNIT/ML ~~LOC~~ SOLN: 0.0000 [IU] | Freq: Every day | SUBCUTANEOUS | Status: DC

## 2018-07-05 NOTE — Plan of Care (Signed)
Patient present in the milieu, ambulates with a steady gait. Attends group with active and appropriate participation. Compliant with treatment plan, presents with flat/sad affect. BP elevated this shift , monitored and rechecked as needed. Denies SI/HI/AVH and pain at this time. Will continue to monitor.
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
## 2018-07-05 NOTE — Consult Note (Signed)
[facility] [place] [facility]   PATIENT NAME: Henry Shaffer    MR#:  [id number]  DATE OF BIRTH:  1975/08/14  DATE OF ADMISSION:  07/03/2018  PRIMARY CARE PHYSICIAN: Beatrix Fettersynthia Feltner  REQUESTING/REFERRING PHYSICIAN: Dr Hessie KnowsSarita O'Neal  CHIEF COMPLAINT:  Consult called for high blood pressure.  HISTORY OF PRESENT ILLNESS:  Henry Milolliot Ahlin  is a [age] y.o. male with a known history of hypertension, diabetes.  Patient was admitted to the psychiatric floor for agitation and bizarre behavior.  Patient currently answering questions appropriately.  He states that he does feel a little woozy and a little fidgety.  He does have a little blurred vision.  No complaints of chest pain or shortness of breath.  States he does have some right arm pain after they did a cardiac catheterization a few months back.  As per the psychiatry team his mother had pneumonia and was unable to take care of him for period time and he may been off his medications at that time.  Blood pressures over the last few days have all been elevated.  Hospitalist services were contacted for further evaluation.  PAST MEDICAL HISTORY:   Past Medical History:  Diagnosis Date  . Anxiety   . Depression   . Diabetes (HCC)   . Hyperlipidemia   . Hypertension   . Schizophrenia (HCC)     PAST SURGICAL HISTORY:   Past Surgical History:  Procedure Laterality Date  . CARDIAC CATHETERIZATION    . MIDDLE EAR SURGERY    . NASAL SINUS SURGERY      SOCIAL HISTORY:   Social History   Tobacco Use  . Smoking status: Current Every Day Smoker    Packs/day: 2.00  . Smokeless tobacco: Never Used  Substance Use Topics  . Alcohol use: Yes    Alcohol/week: 1.0 standard drinks    Types: 1 Cans of beer per week    Comment: occassionally    FAMILY HISTORY:   Family History  Problem Relation Age of Onset  . Diabetes Mother   . CAD Mother   . Heart failure Father   . Diabetes Father   . Hypertension  Father     DRUG ALLERGIES:   Allergies  Allergen Reactions  . Codeine Other (See Comments)    Unknown. Pt's father reported this in the past, but pt doesn't know his reaction.  . Erythromycin Other (See Comments)    Unknown. Pt's father reported this in the past, but pt doesn't know his reaction.  . Sulfa Antibiotics Other (See Comments)    Unknown. Pt's father reported this in the past, but pt doesn't know his reaction.  . Sulfasalazine Other (See Comments)    Unknown. Pt's father reported this in the past, but pt doesn't know his reaction.    REVIEW OF SYSTEMS:  CONSTITUTIONAL: No fever, chills or sweats.  Some weight loss.  Complains of some right arm pain going on for a few months. EYES: Positive for blurred vision. EARS, NOSE, AND THROAT: No tinnitus or ear pain. No sore throat.  Positive for runny nose. RESPIRATORY: No cough, shortness of breath, wheezing or hemoptysis.  CARDIOVASCULAR: No chest pain, orthopnea, edema.  GASTROINTESTINAL: No nausea, vomiting, diarrhea or abdominal pain. No blood in bowel movements GENITOURINARY: No dysuria, hematuria.  ENDOCRINE: No polyuria, nocturia,  HEMATOLOGY: No anemia, easy bruising or bleeding SKIN: No rash or lesion. MUSCULOSKELETAL: Right arm pain and weakness. NEUROLOGIC: No tingling, numbness, weakness.  PSYCHIATRY: History of anxiety depression.  MEDICATIONS AT HOME:   Prior to Admission medications   Medication Sig Start Date End Date Taking? Authorizing Provider  albuterol (PROVENTIL HFA;VENTOLIN HFA) 108 (90 Base) MCG/ACT inhaler Inhale 2 puffs into the lungs every 6 (six) hours as needed for wheezing or shortness of breath.     [provider]  amLODipine (NORVASC) 10 MG tablet Take 1 tablet (10 mg total) by mouth daily. 11/08/17   Katha Hamming, MD  ARIPiprazole (ABILIFY) 10 MG tablet Take 1 tablet (10 mg total) by mouth daily. 11/08/17   Katha Hamming, MD  atorvastatin (LIPITOR) 80 MG tablet Take 1  tablet (80 mg total) by mouth daily. 11/08/17   Katha Hamming, MD  cloNIDine (CATAPRES) 0.1 MG tablet Take 1 tablet (0.1 mg total) by mouth daily. Patient taking differently: Take 0.1 mg by mouth 2 (two) times daily.  11/09/17   Katha Hamming, MD  divalproex (DEPAKOTE) 500 MG DR tablet Take 500 mg by mouth daily. 10/11/17   [provider]  fluticasone (FLONASE) 50 MCG/ACT nasal spray Place 1 spray into both nostrils daily. 04/11/18   [provider]  fluvoxaMINE (LUVOX) 50 MG tablet Take 1 tablet (50 mg total) by mouth at bedtime. Patient not taking: Reported on 11/07/2017 07/31/17   Haskell Riling, MD  INVEGA SUSTENNA 156 MG/ML SUSY injection Inject 156 mg into the muscle every 28 (twenty-eight) days. 06/27/18   [provider]  lisinopril (PRINIVIL,ZESTRIL) 20 MG tablet Take 2 tablets (40 mg total) by mouth daily. 11/08/17   Katha Hamming, MD  metFORMIN (GLUCOPHAGE) 1000 MG tablet Take 1 tablet (1,000 mg total) by mouth 2 (two) times daily. 11/08/17   Katha Hamming, MD  traZODone (DESYREL) 50 MG tablet Take 1 tablet (50 mg total) by mouth at bedtime. 11/08/17   Katha Hamming, MD      VITAL SIGNS:  Blood pressure (!) 151/98, pulse 96, temperature 97.6 F (36.4 C), temperature source Oral, resp. rate 16, height 6' 0.01" (1.829 m), weight 110 kg, SpO2 100 %.  PHYSICAL EXAMINATION:  GENERAL:  42 y.o.-year-old patient lying in the bed with no acute distress.  EYES: Pupils equal, round, reactive to light and accommodation. No scleral icterus. Extraocular muscles intact.  HEENT: Head atraumatic, normocephalic. Oropharynx and nasopharynx clear.  NECK:  Supple, no jugular venous distention. No thyroid enlargement, no tenderness.  LUNGS: Normal breath sounds bilaterally, no wheezing, rales,rhonchi or crepitation. No use of accessory muscles of respiration.  CARDIOVASCULAR: S1, S2 normal. No murmurs, rubs, or gallops.  ABDOMEN: Soft, nontender,  nondistended. Bowel sounds present. No organomegaly or mass.  EXTREMITIES: No pedal edema, cyanosis, or clubbing.  NEUROLOGIC: Cranial nerves II through XII are intact. Muscle strength 5/5 in all extremities. Sensation intact. Gait not checked.  PSYCHIATRIC: The patient is alert and answers questions appropriately.  SKIN: No rash, lesion, or ulcer.   LABORATORY PANEL:   CBC Recent Labs  Lab 07/03/18 1516  WBC 9.8  HGB 14.3  HCT 42.7  PLT 258   ------------------------------------------------------------------------------------------------------------------  Chemistries  Recent Labs  Lab 07/03/18 1516  NA 137  K 4.1  CL 101  CO2 27  GLUCOSE 189*  BUN 13  CREATININE 1.07  CALCIUM 9.1  AST 18  ALT 28  ALKPHOS 68  BILITOT 1.0   ------------------------------------------------------------------------------------------------------------------  Cardiac Enzymes Recent Labs  Lab 07/03/18 1516  TROPONINI 0.03*   ------------------------------------------------------------------------------------------------------------------    EKG:   Normal sinus rhythm 80 bpm, left atrial enlargement flipped T waves inferiorly.  IMPRESSION AND  PLAN:   1.  Accelerated hypertension.  If the patient was noncompliant with medications at home could be a component of rebound hypertension from stopping the clonidine.  Increase clonidine to 0.2 mg twice a day and add chlorthalidone.  Continue his other medications. 2.  Type 2 diabetes mellitus with elevated A1c of 8.7.  Patient restarted on metformin.  Add Tradjenta and sliding scale. 3.  Abnormal EKG.  Reviewing cardiac catheterization from 06/24/2017 it did show minimal nonobstructive coronary artery disease.  No further work-up needed. 4.  History of schizophrenia, anxiety depression.  Defer to psychiatry for treatment.  All the records are reviewed and case discussed with psychiatrist. Management plans discussed with the patient, and he  is in agreement.  CODE STATUS: Full code  TOTAL TIME TAKING CARE OF THIS PATIENT: 50 minutes.    Alford Highland M.D on 07/05/2018 at 5:03 PM  Between 7am to 6pm - Pager - [phone]  After 6pm call admission pager [phone]  [facility]  [phone]  CC: Primary care physician; Beatrix Fetters

## 2018-07-05 NOTE — Progress Notes (Signed)
Recreation Therapy Notes  Date: 07/05/2018  Time: 9:30 am  Location: Craft Room  Behavioral response: Appropriate  Intervention Topic: Coping skills  Discussion/Intervention:  and when they can be used. Individuals described how they normally cope with things and the coping skills they normally use. Patients expressed why it is important to cope with things and how not coping with things can affect you. The group participated in the intervention "Exploring coping skills" where they had a chance to test new coping skills they could use in the future.  Clinical Observations/Feedback:  Patient came to group and defined coping skills as meditation, being happy and relaxing. Individual participated in group and was social with peers and staff while participating in the intervention.He was pulled from group by peer support and never returned to group.  Rahma Meller LRT/CTRS         Gerre Ranum 07/05/2018 11:07 AM

## 2018-07-06 LAB — BASIC METABOLIC PANEL
ANION GAP: 10 (ref 5–15)
BUN: 17 mg/dL (ref 6–20)
CO2: 25 mmol/L (ref 22–32)
Calcium: 9.2 mg/dL (ref 8.9–10.3)
Chloride: 100 mmol/L (ref 98–111)
Creatinine, Ser: 0.92 mg/dL (ref 0.61–1.24)
Glucose, Bld: 148 mg/dL — ABNORMAL HIGH (ref 70–99)
Potassium: 4 mmol/L (ref 3.5–5.1)
Sodium: 135 mmol/L (ref 135–145)

## 2018-07-06 LAB — GLUCOSE, CAPILLARY
GLUCOSE-CAPILLARY: 154 mg/dL — AB (ref 70–99)
Glucose-Capillary: 164 mg/dL — ABNORMAL HIGH (ref 70–99)
Glucose-Capillary: 125 mg/dL — ABNORMAL HIGH (ref 70–99)
Glucose-Capillary: 126 mg/dL — ABNORMAL HIGH (ref 70–99)

## 2018-07-06 MED ORDER — DIVALPROEX SODIUM ER 500 MG PO TB24
1000.0000 mg | ORAL_TABLET | Freq: Every day | ORAL | Status: DC
  Administered 2018-07-07 – 2018-07-10 (×4): 1000 mg via ORAL
  Filled 2018-07-06 (×4): qty 2

## 2018-07-06 NOTE — Plan of Care (Signed)
Patient present in the milieu, ambulates with a steady gait. Attends group with active and appropriate participation. Compliant with treatment plan, presents with flat/sad affect, thoughts are more organized today. BP slightly elevated this shift which is an improvement in comparison to yesterdays readings.  VS monitored and rechecked as needed. Orthostatic BP checks completed and recorded. Denies SI/HI/AVH and pain at this time. Will continue to monitor.

## 2018-07-06 NOTE — Plan of Care (Signed)
Pt. Denies si/hi/avh, contracts verbally for safety. Pt. Monitored for safety on the unit. No observations of responding to internal stimuli.   Problem: Safety: Goal: Periods of time without injury will increase Outcome: Progressing   Problem: Education: Goal: Will be free of psychotic symptoms Outcome: Progressing   Problem: Safety: Goal: Ability to remain free from injury will improve Outcome: Progressing
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
## 2018-07-06 NOTE — Progress Notes (Signed)
[employer] - [place] [facility]   PATIENT NAME: Henry Shaffer    MR#:  [id number]  DATE OF BIRTH:  1976/05/30  SUBJECTIVE:   No acute events overnight   REVIEW OF SYSTEMS:    Review of Systems  Constitutional: Negative for fever, chills weight loss HENT: Negative for ear pain, nosebleeds, congestion, facial swelling, rhinorrhea, neck pain, neck stiffness and ear discharge.   Respiratory: Negative for cough, shortness of breath, wheezing  Cardiovascular: Negative for chest pain, palpitations and leg swelling.  Gastrointestinal: Negative for heartburn, abdominal pain, vomiting, diarrhea or consitpation Genitourinary: Negative for dysuria, urgency, frequency, hematuria Musculoskeletal: Negative for back pain or joint pain Neurological: Negative for dizziness, seizures, syncope, focal weakness,  numbness and headaches.  Hematological: Does not bruise/bleed easily.  Psychiatric/Behavioral: Negative for hallucinations, confusion, ++dysphoric mood    Tolerating Diet: yes      DRUG ALLERGIES:   Allergies  Allergen Reactions  . Codeine Other (See Comments)    Unknown. Pt's father reported this in the past, but pt doesn't know his reaction.  . Erythromycin Other (See Comments)    Unknown. Pt's father reported this in the past, but pt doesn't know his reaction.  . Sulfa Antibiotics Other (See Comments)    Unknown. Pt's father reported this in the past, but pt doesn't know his reaction.  . Sulfasalazine Other (See Comments)    Unknown. Pt's father reported this in the past, but pt doesn't know his reaction.    VITALS:  Blood pressure (!) 162/112, pulse 100, temperature 98.2 F (36.8 C), resp. rate 18, height 6' 0.01" (1.829 m), weight 110 kg, SpO2 97 %.  PHYSICAL EXAMINATION:  Constitutional: Appears well-developed and well-nourished. No distress. HENT: Normocephalic. Marland Kitchen. Oropharynx is clear and moist.  Eyes: Conjunctivae and EOM are normal. PERRLA, no  scleral icterus.  Neck: Normal ROM. Neck supple. No JVD. No tracheal deviation. CVS: RRR, S1/S2 +, no murmurs, no gallops, no carotid bruit.  Pulmonary: Effort and breath sounds normal, no stridor, rhonchi, wheezes, rales.  Abdominal: Soft. BS +,  no distension, tenderness, rebound or guarding.  Musculoskeletal: Normal range of motion. No edema and no tenderness.  Neuro: Alert. CN 2-12 grossly intact. No focal deficits. Skin: Skin is warm and dry. No rash noted. Psychiatric: Normal mood and affect.      LABORATORY PANEL:   CBC Recent Labs  Lab 07/03/18 1516  WBC 9.8  HGB 14.3  HCT 42.7  PLT 258   ------------------------------------------------------------------------------------------------------------------  Chemistries  Recent Labs  Lab 07/03/18 1516 07/06/18 0659  NA 137 135  K 4.1 4.0  CL 101 100  CO2 27 25  GLUCOSE 189* 148*  BUN 13 17  CREATININE 1.07 0.92  CALCIUM 9.1 9.2  AST 18  --   ALT 28  --   ALKPHOS 68  --   BILITOT 1.0  --    ------------------------------------------------------------------------------------------------------------------  Cardiac Enzymes Recent Labs  Lab 07/03/18 1516  TROPONINI 0.03*   ------------------------------------------------------------------------------------------------------------------  RADIOLOGY:  No results found.   ASSESSMENT AND PLAN:   42 year old male with history of diabetes and hypertension.  1.  Accelerated hypertension: Continue current regimen.  Would advise at the time of discharge the patient have follow-up with PCP.  2.  Diabetes with A1c of 8.7: Continue current regimen with ADA diet Patient will need close follow-up with his PCP If needed consult diabetes nurse   3.  Schizophrenia/depression: Treatment per psychiatry   I will sign off.  Please contact me  if you have further questions please. Please discharge with medications as listed under active orders for hypertension and  diabetes.   Management plans discussed with the patient and he is in agreement.  CODE STATUS:  full  TOTAL TIME TAKING CARE OF THIS PATIENT: 25 minutes.     POSSIBLE D/C ??, DEPENDING ON CLINICAL CONDITION.   Jaelene Garciagarcia M.D on 07/06/2018 at 10:32 AM  Between 7am to 6pm - Pager - [phone] After 6pm go to [email or web address] - Social research officer, government  [facility]  Office  [phone]  CC: Primary care physician; Anselm Jungling, MD  Note: This dictation was prepared with Dragon dictation along with smaller phrase technology. Any transcriptional errors that result from this process are unintentional.

## 2018-07-06 NOTE — Progress Notes (Signed)
Recreation Therapy Notes  Date: 07/06/2018  Time: 9:30 pm   Location: Craft Room   Behavioral response: N/A   Intervention Topic: Stress  Discussion/Intervention: Patient did not attend group.   Clinical Observations/Feedback:  Patient did not attend group.   Emarion Toral LRT/CTRS        Zissel Biederman 07/06/2018 11:42 AM

## 2018-07-06 NOTE — Progress Notes (Signed)
D: Pt denies SI/HI/AVH, contracts verbally for safety. Pt is pleasant and cooperative, but childlike. Pt. During interactions is minimal and forwards little, but does answer questions and engage. Pt. has no Complaints, but did express that earlier in the day he was a little upset at his mom for IVC'ing him he reported, but reports that he has gotten over this, stating, "it's because she cares".  Patient Interactions appropriate, no behavioral concerns to report. Pt. Given PRN medication for sleep-aid, when patient awoke during the night reporting trouble sleeping and some anxiety.   A: Q x 15 minute observation checks were completed for safety. Patient was provided with education, but needs reinforcement.  Patient was given/offered medications per orders. Patient  was encourage to attend groups, participate in unit activities and continue with plan of care. Pt. Chart and plans of care reviewed. Pt. Given support and encouragement.   R: Patient is complaint with medication and unit procedures with direction and encouragement. Pt. Attends snacks and groups appropriately. Pt. Blood sugars monitored per MD orders. Pt. Vital signs monitored per MD orders. On call MD notified of orthostatic vital signs completed this evening with no new orders received, but continue to monitor per routine, PRN medications not needed to be given per on call MD review.              Precautionary checks every 15 minutes for safety maintained, room free of safety hazards, patient sustains no injury or falls during this shift. Will endorse care to next shift.

## 2018-07-06 NOTE — [facility] Group Notes (Signed)
[facility] LCSW Group Therapy Note  Date/Time: 07/06/18, 1300  Type of Therapy/Topic:  Group Therapy:  Balance in Life  Participation Level:  active  Description of Group:    This group will address the concept of balance and how it feels and looks when one is unbalanced. Patients will be encouraged to process areas in their lives that are out of balance, and identify reasons for remaining unbalanced. Facilitators will guide patients utilizing problem- solving interventions to address and correct the stressor making their life unbalanced. Understanding and applying boundaries will be explored and addressed for obtaining  and maintaining a balanced life. Patients will be encouraged to explore ways to assertively make their unbalanced needs known to significant others in their lives, using other group members and facilitator for support and feedback.  Therapeutic Goals: 1. Patient will identify two or more emotions or situations they have that consume much of in their lives. 2. Patient will identify signs/triggers that life has become out of balance:  3. Patient will identify two ways to set boundaries in order to achieve balance in their lives:  4. Patient will demonstrate ability to communicate their needs through discussion and/or role plays  Summary of Patient Progress:Pt shared that family and friends are areas that are currently out of balance in life.  Pt active during group discussion regarding positive ways to set boundaries and regain balance. Good participation.           Therapeutic Modalities:   Cognitive Behavioral Therapy Solution-Focused Therapy Assertiveness Training  Daleen SquibbGreg Arinze Rivadeneira, [place]

## 2018-07-06 NOTE — [facility] Group Notes (Signed)
[facility] Group Notes:  (Nursing/MHT/Case Management/Adjunct)  Date:  07/06/2018  Time:  2:46 PM  Type of Therapy:  Psychoeducational Skills  Participation Level:  Minimal  Participation Quality:  Appropriate and Attentive  Affect:  Appropriate  Cognitive:  Appropriate  Insight:  Appropriate and Good  Engagement in Group:  Engaged  Modes of Intervention:  Activity and Education  Summary of Progress/Problems: Left Early Lynelle SmokeCara Travis Statia Burdick 07/06/2018, 2:46 PM

## 2018-07-07 DIAGNOSIS — F250 Schizoaffective disorder, bipolar type: Principal | ICD-10-CM

## 2018-07-07 LAB — GLUCOSE, CAPILLARY
Glucose-Capillary: 154 mg/dL — ABNORMAL HIGH (ref 70–99)
Glucose-Capillary: 169 mg/dL — ABNORMAL HIGH (ref 70–99)
Glucose-Capillary: 114 mg/dL — ABNORMAL HIGH (ref 70–99)
Glucose-Capillary: 101 mg/dL — ABNORMAL HIGH (ref 70–99)

## 2018-07-07 NOTE — Plan of Care (Signed)
Pt. Denies si/hi/avh, contracting verbally for safety. Pt. Reports  he can remain safe while on the unit. Pt. Not observed presenting with any psychotic and or bizarre behaviors this evening.    Problem: Safety: Goal: Periods of time without injury will increase Outcome: Progressing   Problem: Education: Goal: Will be free of psychotic symptoms Outcome: Progressing   Problem: Safety: Goal: Ability to remain free from injury will improve Outcome: Progressing

## 2018-07-07 NOTE — Progress Notes (Signed)
[facility]  07/07/2018 2:25 PM Henry Shaffer  MRN:  [id number] Subjective: Follow-up for this patient with schizoaffective disorder.  He has shown significant improvement since hospitalization.  Compliant with medicine.  Much less paranoid.  Denies suicidal or homicidal ideation.  Taking care of his ADLs better.  Blood sugars significantly improved.  Showing better insight. Principal Problem: Schizoaffective disorder, bipolar type (HCC) Diagnosis: Principal Problem:   Schizoaffective disorder, bipolar type (HCC)  Total Time spent with patient: 30 minutes  Past Psychiatric History: History of chronic mental illness which decompensated after a recent discontinuation of medicine  Past Medical History:  Past Medical History:  Diagnosis Date  . Anxiety   . Depression   . Diabetes (HCC)   . Hyperlipidemia   . Hypertension   . Schizophrenia [facility](HCC)     Past Surgical History:  Procedure Laterality Date  . CARDIAC CATHETERIZATION    . MIDDLE EAR SURGERY    . NASAL SINUS SURGERY     Family History:  Family History  Problem Relation Age of Onset  . Diabetes Mother   . CAD Mother   . Heart failure Father   . Diabetes Father   . Hypertension Father    Family Psychiatric  History: See previous notes Social History:  Social History   Substance and Sexual Activity  Alcohol Use Yes  . Alcohol/week: 1.0 standard drinks  . Types: 1 Cans of beer per week   Comment: occassionally     Social History   Substance and Sexual Activity  Drug Use Yes  . Types: Marijuana   Comment: liquid marijuana 2 weeks ago    Social History   Socioeconomic History  . Marital status: Single    Spouse name: Not on file  . Number of children: Not on file  . Years of education: Not on file  . Highest education level: Not on file  Occupational History  . Not on file  Social Needs  . Financial resource strain: Not on file  . Food insecurity:    Worry: Not on file    Inability: Not on  file  . Transportation needs:    Medical: Not on file    Non-medical: Not on file  Tobacco Use  . Smoking status: Current Every Day Smoker    Packs/day: 2.00  . Smokeless tobacco: Never Used  Substance and Sexual Activity  . Alcohol use: Yes    Alcohol/week: 1.0 standard drinks    Types: 1 Cans of beer per week    Comment: occassionally  . Drug use: Yes    Types: Marijuana    Comment: liquid marijuana 2 weeks ago  . Sexual activity: Never  Lifestyle  . Physical activity:    Days per week: Not on file    Minutes per session: Not on file  . Stress: Not on file  Relationships  . Social connections:    Talks on phone: Not on file    Gets together: Not on file    Attends religious service: Not on file    Active member of club or organization: Not on file    Attends meetings of clubs or organizations: Not on file    Relationship status: Not on file  Other Topics Concern  . Not on file  Social History Narrative   Independent at baseline   Additional Social History:  Sleep: Fair  Appetite:  Fair  Current Medications: Current Facility-Administered Medications  Medication Dose Route Frequency Provider Last Rate Last Dose  . acetaminophen (TYLENOL) tablet 650 mg  650 mg Oral Q6H PRN Katheleen Stella T, MD      . alum & mag hydroxide-simeth (MAALOX/MYLANTA) 200-200-20 MG/5ML suspension 30 mL  30 mL Oral Q4H PRN Khylei Wilms T, MD      . amLODipine (NORVASC) tablet 10 mg  10 mg Oral Daily Avalyn Molino T, MD   10 mg at 07/07/18 0830  . atorvastatin (LIPITOR) tablet 80 mg  80 mg Oral q1800 Ceferino Lang T, MD   80 mg at 07/06/18 1728  . chlorthalidone (HYGROTON) tablet 25 mg  25 mg Oral Daily Alford Highland, MD   25 mg at 07/07/18 0831  . cloNIDine (CATAPRES) tablet 0.1 mg  0.1 mg Oral BID PRN Hessie Knows, MD   0.1 mg at 07/04/18 1734  . cloNIDine (CATAPRES) tablet 0.2 mg  0.2 mg Oral BID Alford Highland, MD   0.2 mg at 07/07/18 0830  .  divalproex (DEPAKOTE ER) 24 hr tablet 1,000 mg  1,000 mg Oral Daily Hessie Knows, MD   1,000 mg at 07/07/18 0830  . fluvoxaMINE (LUVOX) tablet 50 mg  50 mg Oral QHS Sharif Rendell T, MD   50 mg at 07/06/18 2156  . hydrOXYzine (ATARAX/VISTARIL) tablet 50 mg  50 mg Oral TID PRN Kristen Bushway, Jackquline Denmark, MD   50 mg at 07/06/18 2156  . insulin aspart (novoLOG) injection 0-5 Units  0-5 Units Subcutaneous QHS Wieting, Richard, MD      . insulin aspart (novoLOG) injection 0-9 Units  0-9 Units Subcutaneous TID WC Alford Highland, MD   2 Units at 07/07/18 1226  . linagliptin (TRADJENTA) tablet 5 mg  5 mg Oral Daily Alford Highland, MD   5 mg at 07/07/18 0831  . lisinopril (PRINIVIL,ZESTRIL) tablet 40 mg  40 mg Oral Daily Aryanah Enslow, Jackquline Denmark, MD   40 mg at 07/07/18 0829  . magnesium hydroxide (MILK OF MAGNESIA) suspension 30 mL  30 mL Oral Daily PRN Jurnee Nakayama, Jackquline Denmark, MD   30 mL at 07/06/18 0835  . metFORMIN (GLUCOPHAGE) tablet 1,000 mg  1,000 mg Oral BID WC Jaimarie Rapozo T, MD   1,000 mg at 07/07/18 0830  . traZODone (DESYREL) tablet 50 mg  50 mg Oral QHS PRN Hessie Knows, MD   50 mg at 07/06/18 2156    Lab Results:  Results for orders placed or performed during the hospital encounter of 07/03/18 (from the past 48 hour(s))  Glucose, capillary     Status: Abnormal   Collection Time: 07/05/18  4:26 PM  Result Value Ref Range   Glucose-Capillary 124 (H) 70 - 99 mg/dL  Glucose, capillary     Status: Abnormal   Collection Time: 07/05/18  8:25 PM  Result Value Ref Range   Glucose-Capillary 112 (H) 70 - 99 mg/dL   Comment 1 Notify RN   Basic metabolic panel     Status: Abnormal   Collection Time: 07/06/18  6:59 AM  Result Value Ref Range   Sodium 135 135 - 145 mmol/L   Potassium 4.0 3.5 - 5.1 mmol/L   Chloride 100 98 - 111 mmol/L   CO2 25 22 - 32 mmol/L   Glucose, Bld 148 (H) 70 - 99 mg/dL   BUN 17 6 - 20 mg/dL   Creatinine, Ser 1.61 0.61 - 1.24 mg/dL   Calcium 9.2 8.9 - 09.6 mg/dL  GFR calc non Af Amer >60  >60 mL/min   GFR calc Af Amer >60 >60 mL/min   Anion gap 10 5 - 15    Comment: Performed at [facility], [address] [place], [place] [address]  Glucose, capillary     Status: Abnormal   Collection Time: 07/06/18  7:08 AM  Result Value Ref Range   Glucose-Capillary 154 (H) 70 - 99 mg/dL  Glucose, capillary     Status: Abnormal   Collection Time: 07/06/18 11:15 AM  Result Value Ref Range   Glucose-Capillary 164 (H) 70 - 99 mg/dL  Glucose, capillary     Status: Abnormal   Collection Time: 07/06/18  4:21 PM  Result Value Ref Range   Glucose-Capillary 125 (H) 70 - 99 mg/dL  Glucose, capillary     Status: Abnormal   Collection Time: 07/06/18  8:03 PM  Result Value Ref Range   Glucose-Capillary 126 (H) 70 - 99 mg/dL   Comment 1 Notify RN   Glucose, capillary     Status: Abnormal   Collection Time: 07/07/18  7:07 AM  Result Value Ref Range   Glucose-Capillary 154 (H) 70 - 99 mg/dL   Comment 1 Notify RN   Glucose, capillary     Status: Abnormal   Collection Time: 07/07/18 11:22 AM  Result Value Ref Range   Glucose-Capillary 169 (H) 70 - 99 mg/dL    Blood Alcohol level:  Lab Results  Component Value Date   ETH <10 07/03/2018   ETH <10 07/21/2017    Metabolic Disorder Labs: Lab Results  Component Value Date   HGBA1C 8.7 (H) 07/04/2018   MPG 202.99 07/04/2018   MPG 211.6 11/07/2017   No results found for: PROLACTIN Lab Results  Component Value Date   CHOL 70 07/04/2018   TRIG 88 07/04/2018   HDL 29 (L) 07/04/2018   CHOLHDL 2.4 07/04/2018   VLDL 18 07/04/2018   LDLCALC 23 07/04/2018   LDLCALC 23 07/22/2017    Physical Findings: AIMS:  , ,  ,  ,    CIWA:    COWS:     Musculoskeletal: Strength & Muscle Tone: within normal limits Gait & Station: normal Patient leans: N/A  Psychiatric Specialty Exam: Physical Exam  Nursing note and vitals reviewed. Constitutional: He appears well-developed and well-nourished.  HENT:  Head: Normocephalic and  atraumatic.  Eyes: Pupils are equal, round, and reactive to light. Conjunctivae are normal.  Neck: Normal range of motion.  Cardiovascular: Regular rhythm and normal heart sounds.  Respiratory: Effort normal. No respiratory distress.  GI: Soft.  Musculoskeletal: Normal range of motion.  Neurological: He is alert.  Skin: Skin is warm and dry.  Psychiatric: His speech is normal and behavior is normal. Judgment and thought content normal. His affect is blunt. Cognition and memory are normal.    Review of Systems  Constitutional: Negative.   HENT: Negative.   Eyes: Negative.   Respiratory: Negative.   Cardiovascular: Negative.   Gastrointestinal: Negative.   Musculoskeletal: Negative.   Skin: Negative.   Neurological: Negative.   Psychiatric/Behavioral: Negative for depression, hallucinations, memory loss, substance abuse and suicidal ideas. The patient is not nervous/anxious and does not have insomnia.     Blood pressure 122/71, pulse 97, temperature 98.3 F (36.8 C), temperature source Oral, resp. rate 16, height 6' 0.01" (1.829 m), weight 110 kg, SpO2 100 %.Body mass index is 32.88 kg/m.  General Appearance: Casual  Eye Contact:  Good  Speech:  Clear and  Coherent  Volume:  Decreased  Mood:  Anxious  Affect:  Congruent  Thought Process:  Goal Directed  Orientation:  Full (Time, Place, and Person)  Thought Content:  Logical  Suicidal Thoughts:  No  Homicidal Thoughts:  No  Memory:  Immediate;   Fair Recent;   Fair Remote;   Fair  Judgement:  Fair  Insight:  Fair  Psychomotor Activity:  Normal  Concentration:  Concentration: Fair  Recall:  [employer] of Knowledge:  Fair  Language:  Fair  Akathisia:  No  Handed:  Right  AIMS (if indicated):     Assets:  Desire for Improvement Housing Physical Health  ADL's:  Intact  Cognition:  WNL  Sleep:  Number of Hours: 7.15     Treatment Plan Summary: Daily contact with patient to assess and evaluate symptoms and progress  in treatment, Medication management and Plan Patient still expressed a little bit of anxiety saying that he worried that he had caused his mother some distress.  I reassured her that he was doing well in the hospital.  Blood sugars are much improved.  No change to medication for today.  Henry Rasmussen, MD 07/07/2018, 2:25 PM

## 2018-07-07 NOTE — [facility] Group Notes (Signed)
LCSW Group Therapy Note  07/07/2018 1:15pm  Type of Therapy and Topic:  Group Therapy:  Healthy Self Image and Positive Change  Participation Level:  Active   Description of Group:  In this group, patients will compare and contrast their current "I am...." statements to the visions they identify as desirable for their lives.  Patients discuss fears and how they can make positive changes in their cognitions that will positively impact their behaviors.  Facilitator played a motivational 3-minute speech and patients were left with the task of thinking about what "I am...." statements they can start using in their lives immediately.  Therapeutic Goals: 1. Patient will state their current self-perception as expressed in an "I Am" statement 2. Patient will contrast this with their desired vision for their live 3. Patient will identify 3 fears that negatively impact their behavior 4. Patient will discuss cognitive distortions that stem from their fears 5. Patient will verbalize statements that challenge their cognitive distortions  Summary of Patient Progress: The patient reported that he feels "pretty good." The patient stated, "I am generous." Patient discussed his/her fears and how he can make positive changes in their cognitions that will positively impact his behaviors. Patient was able to discuss and process cognitive distortions that stem from his fears. Patient actively and appropriately engaged in the group. Patient was able to provide support and validation to other group members. Patient practiced active listening when interacting with the facilitator and other group members.    Therapeutic Modalities Cognitive Behavioral Therapy Motivational Interviewing  Henry Shaffer  CUEBAS-COLON, LCSW 07/07/2018 4:02 PM

## 2018-07-07 NOTE — Progress Notes (Signed)
D: Pt denies SI/HI/AVH, contracts verbally for safety. Pt is pleasant and cooperative, engages appropriately with this Clinical research associatewriter, staff, and peers. Pt. Complains of some anxiety this evening, given PRN medications for this. Pt. Attends snack time appropriately. Pt. Given sleep-aid for reported trouble sleeping. Pt. Reports no depression this evening. Pt. Denies physical pain. Mood is anxious, but pleasant. Pt. Affect mostly flat. Pt. Behavior is appropriate, no behavioral concerns to report.   A: Q x 15 minute observation checks were completed for safety. Patient was provided with education, but needs reinforcement.  Patient was given/offered medications per orders. Patient  was encourage to attend groups, participate in unit activities and continue with plan of care. Pt. Chart and plans of care reviewed. Pt. Given support and encouragement.   R: Patient is complaint with medication and unit procedures with direction and encouragement. Pt. Assessed for orthostatic hypotension per MD orders every shift. Pt. Blood sugars monitored per MD orders.              Precautionary checks every 15 minutes for safety maintained, room free of safety hazards, patient sustains no injury or falls during this shift. Will endorse care to next shift.

## 2018-07-07 NOTE — Plan of Care (Signed)
Patient is alert and oriented X 4, deines SI, HI and AVH, and pain 0/10. Patient is animated and cooperative and pleasant this morning. Patient is interacting with peers on the unit appropriately. Paces the unit, but shows no signs of self harm. Patient verbally contracts for safety and is taking medications as directed. Safety checks Q 15 minutes to continue. Problem: Education: Goal: Knowledge of [place] [facility] Education information/materials will improve Outcome: Progressing Goal: Emotional status will improve Outcome: Progressing Goal: Mental status will improve Outcome: Progressing Goal: Verbalization of understanding the information provided will improve Outcome: Progressing   Problem: Safety: Goal: Periods of time without injury will increase Outcome: Progressing   Problem: Education: Goal: Will be free of psychotic symptoms Outcome: Progressing Goal: Knowledge of the prescribed therapeutic regimen will improve Outcome: Progressing

## 2018-07-08 LAB — GLUCOSE, CAPILLARY
Glucose-Capillary: 165 mg/dL — ABNORMAL HIGH (ref 70–99)
Glucose-Capillary: 188 mg/dL — ABNORMAL HIGH (ref 70–99)

## 2018-07-08 NOTE — Plan of Care (Signed)
D: Patient denies SI/HI/AVH. Patient is calm, cooperative and pleasant. Patient is seen in milieu interacting with peers. Patient has no complaints at this time. Patient is preparing for discharge.  A: Patient was assessed by this nurse. Patient was oriented to unit. Patient's safety was maintained on unit. Q x 15 minute observation checks were completed for safety. Patient care plan was reviewed. Patient was offered support and encouragement. Patient was encourage to attend groups, participate in unit activities and continue with plan of care.    R: Patient has no complaints of pain at this time. Patient is receptive to treatment and safety maintained on unit.     Problem: Education: Goal: Knowledge of [facility] Education information/materials will improve Outcome: Not Progressing Goal: Emotional status will improve Outcome: Not Progressing Goal: Mental status will improve Outcome: Not Progressing

## 2018-07-08 NOTE — [facility] Group Notes (Signed)
LCSW Group Therapy Note 07/08/2018 1:15pm  Type of Therapy and Topic: Group Therapy: Feelings Around Returning Home & Establishing a Supportive Framework and Supporting Oneself When Supports Not Available  Participation Level: Active  Description of Group:  Patients first processed thoughts and feelings about upcoming discharge. These included fears of upcoming changes, lack of change, new living environments, judgements and expectations from others and overall stigma of mental health issues. The group then discussed the definition of a supportive framework, what that looks and feels like, and how do to discern it from an unhealthy non-supportive network. The group identified different types of supports as well as what to do when your family/friends are less than helpful or unavailable  Therapeutic Goals  1. Patient will identify one healthy supportive network that they can use at discharge. 2. Patient will identify one factor of a supportive framework and how to tell it from an unhealthy network. 3. Patient able to identify one coping skill to use when they do not have positive supports from others. 4. Patient will demonstrate ability to communicate their needs through discussion and/or role plays.  Summary of Patient Progress:  The patient reported he feels "relaxed." Pt engaged during group session. As patients processed their anxiety about discharge and described healthy supports patient shared he is ready to be discharge. He stated "I learned to put things behind." He listed his mom and uncle as his main support. Patients identified at least one self-care tool they were willing to use after discharge; deep breathing, reading scriptures, and listening to music.   Therapeutic Modalities Cognitive Behavioral Therapy Motivational Interviewing   Dimitry Holsworth  CUEBAS-COLON, LCSW 07/08/2018 11:39 AM

## 2018-07-08 NOTE — Plan of Care (Signed)
Pt. Denies si/hi/avh, contracting verbally for safety. Pt. Reports he can remain safe while on the unit. Pt. This evening is religiously preoccupied and difficult to get off of the topic. Patient's thought process is loose with associations and bizarre at times. Pt. Continues to report anxiety this evening about his mother IVC'ing him and being controlled by his mother.    Problem: Safety: Goal: Periods of time without injury will increase Outcome: Progressing   Problem: Safety: Goal: Ability to remain free from injury will improve Outcome: Progressing   Problem: Education: Goal: Emotional status will improve Outcome: Not Progressing Goal: Mental status will improve Outcome: Not Progressing   Problem: Education: Goal: Will be free of psychotic symptoms Outcome: Not Progressing

## 2018-07-08 NOTE — Progress Notes (Signed)
D: Pt denies SI/HI/AVH, contracts verbally for safety. Pt is pleasant and cooperative and engages actively with this writer, but frequently this evening is heavily preoccupied with talking about religion and his mom controlling him and bringing him here. Pt. Reports, "I don't want to believe in jesus and the four angels anymore and I'm not going to anymore, I don't care what my mother wants", "my mother keeps pushing the four angels on me", "I'm tired of her controlling me, she's always controlling me". Pt. Redirectable mostly with good effort. Pt. Given lots of support and encouragement. Pt. Also given PRN medication for reported anxiety and sleep-aid for sleep. Pt. Affect mostly flat. Pt. Behavior is appropriate, but childlike.   A: Q x 15 minute observation checks were completed for safety. Patient was provided with education, but needs reinforcement.  Patient was given/offered medications per orders. Patient  was encourage to attend groups, participate in unit activities and continue with plan of care. Pt. Chart and plans of care reviewed.    R: Patient is complaint with medication and unit procedures with direction and encouragement. Pt. Monitored for orthostatic hypotension. Pt. blood sugars monitored per MD orders. Pt. Denies pain.            Precautionary checks every 15 minutes for safety maintained, room free of safety hazards, patient sustains no injury or falls during this shift. Will endorse care to next shift.

## 2018-07-08 NOTE — Progress Notes (Signed)
[facility] MD Progress Note  07/08/2018 12:11 PM Henry Shaffer  MRN:  [id number] Subjective: Follow-up patient with schizoaffective disorder and high blood pressure.  Patient says he is feeling much better today.  Denies feeling depressed.  Denies suicidal thoughts denies hallucinations.  Blood pressure is essentially normal today. Principal Problem: Schizoaffective disorder, bipolar type (HCC) Diagnosis: Principal Problem:   Schizoaffective disorder, bipolar type (HCC)  Total Time spent with patient: 15 minutes  Past Psychiatric History: History of schizoaffective disorder and recent noncompliance  Past Medical History:  Past Medical History:  Diagnosis Date  . Anxiety   . Depression   . Diabetes (HCC)   . Hyperlipidemia   . Hypertension   . Schizophrenia Henry Shaffer)     Past Surgical History:  Procedure Laterality Date  . CARDIAC CATHETERIZATION    . MIDDLE EAR SURGERY    . NASAL SINUS SURGERY     Family History:  Family History  Problem Relation Age of Onset  . Diabetes Mother   . CAD Mother   . Heart failure Father   . Diabetes Father   . Hypertension Father    Family Psychiatric  History: See previous note Social History:  Social History   Substance and Sexual Activity  Alcohol Use Yes  . Alcohol/week: 1.0 standard drinks  . Types: 1 Cans of beer per week   Comment: occassionally     Social History   Substance and Sexual Activity  Drug Use Yes  . Types: Marijuana   Comment: liquid marijuana 2 weeks ago    Social History   Socioeconomic History  . Marital status: Single    Spouse name: Not on file  . Number of children: Not on file  . Years of education: Not on file  . Highest education level: Not on file  Occupational History  . Not on file  Social Needs  . Financial resource strain: Not on file  . Food insecurity:    Worry: Not on file    Inability: Not on file  . Transportation needs:    Medical: Not on file    Non-medical: Not on file  Tobacco  Use  . Smoking status: Current Every Day Smoker    Packs/day: 2.00  . Smokeless tobacco: Never Used  Substance and Sexual Activity  . Alcohol use: Yes    Alcohol/week: 1.0 standard drinks    Types: 1 Cans of beer per week    Comment: occassionally  . Drug use: Yes    Types: Marijuana    Comment: liquid marijuana 2 weeks ago  . Sexual activity: Never  Lifestyle  . Physical activity:    Days per week: Not on file    Minutes per session: Not on file  . Stress: Not on file  Relationships  . Social connections:    Talks on phone: Not on file    Gets together: Not on file    Attends religious service: Not on file    Active member of club or organization: Not on file    Attends meetings of clubs or organizations: Not on file    Relationship status: Not on file  Other Topics Concern  . Not on file  Social History Narrative   Independent at baseline   Additional Social History:                         Sleep: Fair  Appetite:  Fair  Current Medications: Current Facility-Administered Medications  Medication Dose  Route Frequency Provider Last Rate Last Dose  . acetaminophen (TYLENOL) tablet 650 mg  650 mg Oral Q6H PRN Clapacs, John T, MD      . alum & mag hydroxide-simeth (MAALOX/MYLANTA) 200-200-20 MG/5ML suspension 30 mL  30 mL Oral Q4H PRN Clapacs, John T, MD      . amLODipine (NORVASC) tablet 10 mg  10 mg Oral Daily Clapacs, John T, MD   10 mg at 07/08/18 0900  . atorvastatin (LIPITOR) tablet 80 mg  80 mg Oral q1800 Clapacs, Jackquline Denmark, MD   80 mg at 07/07/18 1729  . chlorthalidone (HYGROTON) tablet 25 mg  25 mg Oral Daily Alford Highland, MD   25 mg at 07/08/18 0900  . cloNIDine (CATAPRES) tablet 0.1 mg  0.1 mg Oral BID PRN Hessie Knows, MD   0.1 mg at 07/04/18 1734  . cloNIDine (CATAPRES) tablet 0.2 mg  0.2 mg Oral BID Alford Highland, MD   0.2 mg at 07/08/18 0900  . divalproex (DEPAKOTE ER) 24 hr tablet 1,000 mg  1,000 mg Oral Daily Hessie Knows, MD   1,000 mg at  07/08/18 0900  . fluvoxaMINE (LUVOX) tablet 50 mg  50 mg Oral QHS Clapacs, John T, MD   50 mg at 07/07/18 2107  . hydrOXYzine (ATARAX/VISTARIL) tablet 50 mg  50 mg Oral TID PRN Clapacs, Jackquline Denmark, MD   50 mg at 07/07/18 2107  . insulin aspart (novoLOG) injection 0-5 Units  0-5 Units Subcutaneous QHS Wieting, Richard, MD      . insulin aspart (novoLOG) injection 0-9 Units  0-9 Units Subcutaneous TID WC Alford Highland, MD   2 Units at 07/08/18 0900  . linagliptin (TRADJENTA) tablet 5 mg  5 mg Oral Daily Alford Highland, MD   5 mg at 07/08/18 0900  . lisinopril (PRINIVIL,ZESTRIL) tablet 40 mg  40 mg Oral Daily Clapacs, John T, MD   40 mg at 07/08/18 0900  . magnesium hydroxide (MILK OF MAGNESIA) suspension 30 mL  30 mL Oral Daily PRN Clapacs, Jackquline Denmark, MD   30 mL at 07/06/18 0835  . metFORMIN (GLUCOPHAGE) tablet 1,000 mg  1,000 mg Oral BID WC Clapacs, John T, MD   1,000 mg at 07/08/18 0900  . traZODone (DESYREL) tablet 50 mg  50 mg Oral QHS PRN Hessie Knows, MD   50 mg at 07/07/18 2108    Lab Results:  Results for orders placed or performed during the Shaffer encounter of 07/03/18 (from the past 48 hour(s))  Glucose, capillary     Status: Abnormal   Collection Time: 07/06/18  4:21 PM  Result Value Ref Range   Glucose-Capillary 125 (H) 70 - 99 mg/dL  Glucose, capillary     Status: Abnormal   Collection Time: 07/06/18  8:03 PM  Result Value Ref Range   Glucose-Capillary 126 (H) 70 - 99 mg/dL   Comment 1 Notify RN   Glucose, capillary     Status: Abnormal   Collection Time: 07/07/18  7:07 AM  Result Value Ref Range   Glucose-Capillary 154 (H) 70 - 99 mg/dL   Comment 1 Notify RN   Glucose, capillary     Status: Abnormal   Collection Time: 07/07/18 11:22 AM  Result Value Ref Range   Glucose-Capillary 169 (H) 70 - 99 mg/dL  Glucose, capillary     Status: Abnormal   Collection Time: 07/07/18  4:03 PM  Result Value Ref Range   Glucose-Capillary 114 (H) 70 - 99 mg/dL  Glucose, capillary  Status: Abnormal   Collection Time: 07/07/18  8:28 PM  Result Value Ref Range   Glucose-Capillary 101 (H) 70 - 99 mg/dL   Comment 1 Notify RN   Glucose, capillary     Status: Abnormal   Collection Time: 07/08/18  7:08 AM  Result Value Ref Range   Glucose-Capillary 165 (H) 70 - 99 mg/dL   Comment 1 Notify RN   Glucose, capillary     Status: Abnormal   Collection Time: 07/08/18 11:29 AM  Result Value Ref Range   Glucose-Capillary 188 (H) 70 - 99 mg/dL   Comment 1 Notify RN     Blood Alcohol level:  Lab Results  Component Value Date   ETH <10 07/03/2018   ETH <10 07/21/2017    Metabolic Disorder Labs: Lab Results  Component Value Date   HGBA1C 8.7 (H) 07/04/2018   MPG 202.99 07/04/2018   MPG 211.6 11/07/2017   No results found for: PROLACTIN Lab Results  Component Value Date   CHOL 70 07/04/2018   TRIG 88 07/04/2018   HDL 29 (L) 07/04/2018   CHOLHDL 2.4 07/04/2018   VLDL 18 07/04/2018   LDLCALC 23 07/04/2018   LDLCALC 23 07/22/2017    Physical Findings: AIMS:  , ,  ,  ,    CIWA:    COWS:     Musculoskeletal: Strength & Muscle Tone: within normal limits Gait & Station: normal Patient leans: N/A  Psychiatric Specialty Exam: Physical Exam  Nursing note and vitals reviewed. Constitutional: He appears well-developed and well-nourished.  HENT:  Head: Normocephalic and atraumatic.  Eyes: Pupils are equal, round, and reactive to light. Conjunctivae are normal.  Neck: Normal range of motion.  Cardiovascular: Normal heart sounds.  Respiratory: Effort normal.  GI: Soft.  Musculoskeletal: Normal range of motion.  Neurological: He is alert.  Skin: Skin is warm and dry.  Psychiatric: He has a normal mood and affect. His behavior is normal. Judgment and thought content normal.    Review of Systems  Constitutional: Negative.   HENT: Negative.   Eyes: Negative.   Respiratory: Negative.   Cardiovascular: Negative.   Gastrointestinal: Negative.   Musculoskeletal:  Negative.   Skin: Negative.   Neurological: Negative.   Psychiatric/Behavioral: Negative.     Blood pressure 128/80, pulse 85, temperature 98.5 F (36.9 C), temperature source Oral, resp. rate 18, height 6' 0.01" (1.829 m), weight 110 kg, SpO2 98 %.Body mass index is 32.88 kg/m.  General Appearance: Casual  Eye Contact:  Fair  Speech:  Slow  Volume:  Decreased  Mood:  Euthymic  Affect:  Constricted  Thought Process:  Coherent  Orientation:  Full (Time, Place, and Person)  Thought Content:  Logical  Suicidal Thoughts:  No  Homicidal Thoughts:  No  Memory:  Immediate;   Fair Recent;   Fair Remote;   Fair  Judgement:  Impaired  Insight:  Shallow  Psychomotor Activity:  Normal  Concentration:  Concentration: Fair  Recall:  [employer] of Knowledge:  Fair  Language:  Fair  Akathisia:  No  Handed:  Right  AIMS (if indicated):     Assets:  Desire for Improvement Housing Physical Health  ADL's:  Intact  Cognition:  WNL  Sleep:  Number of Hours: 5.3     Treatment Plan Summary: Daily contact with patient to assess and evaluate symptoms and progress in treatment, Medication management and Plan No change to medication.  Supportive counseling.  Encourage group attendance.  Praised his medicine compliance and  his good hygiene.  Patient may be ready for discharge fairly soon.  Mordecai RasmussenJohn Clapacs, MD 07/08/2018, 12:11 PM

## 2018-07-09 LAB — GLUCOSE, CAPILLARY
GLUCOSE-CAPILLARY: 102 mg/dL — AB (ref 70–99)
GLUCOSE-CAPILLARY: 135 mg/dL — AB (ref 70–99)
Glucose-Capillary: 162 mg/dL — ABNORMAL HIGH (ref 70–99)
Glucose-Capillary: 176 mg/dL — ABNORMAL HIGH (ref 70–99)
Glucose-Capillary: 110 mg/dL — ABNORMAL HIGH (ref 70–99)
Glucose-Capillary: 104 mg/dL — ABNORMAL HIGH (ref 70–99)

## 2018-07-09 MED ORDER — LINAGLIPTIN 5 MG PO TABS: 5.0000 mg | ORAL_TABLET | Freq: Every day | ORAL | 1 refills | Status: DC

## 2018-07-09 MED ORDER — LISINOPRIL 40 MG PO TABS: 40.0000 mg | ORAL_TABLET | Freq: Every day | ORAL | 1 refills | Status: DC

## 2018-07-09 MED ORDER — CLONIDINE HCL 0.2 MG PO TABS: 0.2000 mg | ORAL_TABLET | Freq: Two times a day (BID) | ORAL | 1 refills | Status: DC

## 2018-07-09 MED ORDER — CHLORTHALIDONE 25 MG PO TABS: 25.0000 mg | ORAL_TABLET | Freq: Every day | ORAL | 1 refills | Status: DC

## 2018-07-09 MED ORDER — TRAZODONE HCL 50 MG PO TABS: 50.0000 mg | ORAL_TABLET | Freq: Every evening | ORAL | 1 refills | Status: DC | PRN

## 2018-07-09 MED ORDER — ATORVASTATIN CALCIUM 80 MG PO TABS: 80.0000 mg | ORAL_TABLET | Freq: Every day | ORAL | 1 refills | Status: DC

## 2018-07-09 MED ORDER — METFORMIN HCL 1000 MG PO TABS: 1000.0000 mg | ORAL_TABLET | Freq: Two times a day (BID) | ORAL | 0 refills | Status: DC

## 2018-07-09 MED ORDER — FLUVOXAMINE MALEATE 50 MG PO TABS: 50.0000 mg | ORAL_TABLET | Freq: Every day | ORAL | 1 refills | Status: DC

## 2018-07-09 MED ORDER — DIVALPROEX SODIUM ER 500 MG PO TB24: 1000.0000 mg | ORAL_TABLET | Freq: Every day | ORAL | 1 refills | Status: DC

## 2018-07-09 MED ORDER — INVEGA SUSTENNA 156 MG/ML IM SUSY: 156.0000 mg | PREFILLED_SYRINGE | INTRAMUSCULAR | 1 refills | Status: DC

## 2018-07-09 MED ORDER — AMLODIPINE BESYLATE 10 MG PO TABS: 10.0000 mg | ORAL_TABLET | Freq: Every day | ORAL | 1 refills | Status: DC
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
## 2018-07-09 NOTE — Plan of Care (Signed)
Pt. Denies si/hi/avh, contracts for safety. Pt. Reports he can remain safe while on the unit. Pt. Behavior appropriate this evening.    Problem: Safety: Goal: Periods of time without injury will increase Outcome: Progressing   Problem: Education: Goal: Will be free of psychotic symptoms Outcome: Progressing   Problem: Safety: Goal: Ability to remain free from injury will improve Outcome: Progressing

## 2018-07-09 NOTE — Plan of Care (Signed)
Patient is alert and oriented. Patient denies SI, HI and AVH. Patient has been pleasant and cooperative attending groups and taking scheduled medications. Patient blood glucose have been at breakfast 162, lunch time 176 and dinner 110. Patient did become a little upset later this afternoon after been told he was not leaving today. After speaking with doctor the plan is to leave tomorrow. Patient denies AVH, although at times RN overheard patient talking to self aloud. No self harming behaviors noted. Safety checks Q 15 to continue. Problem: Education: Goal: Knowledge of [place] [facility] Education information/materials will improve Outcome: Progressing Goal: Emotional status will improve Outcome: Progressing Goal: Mental status will improve Outcome: Progressing Goal: Verbalization of understanding the information provided will improve Outcome: Progressing   Problem: Safety: Goal: Periods of time without injury will increase Outcome: Progressing   Problem: Education: Goal: Will be free of psychotic symptoms Outcome: Progressing Goal: Knowledge of the prescribed therapeutic regimen will improve Outcome: Progressing

## 2018-07-09 NOTE — Progress Notes (Signed)
Recreation Therapy Notes  Date: 07/09/2018  Time: 9:30 pm   Location: Craft Room   Behavioral response: N/A   Intervention Topic: Problem Solving  Discussion/Intervention: Patient did not attend group.   Clinical Observations/Feedback:  Patient did not attend group.   Stanly Si LRT/CTRS        Henry Shaffer 07/09/2018 10:17 AM 

## 2018-07-09 NOTE — [facility] Group Notes (Signed)
[facility] LCSW Group Therapy Note  Date/Time: 07/09/18, 1300  Type of Therapy and Topic:  Group Therapy:  Overcoming Obstacles  Participation Level:  active  Description of Group:    In this group patients will be encouraged to explore what they see as obstacles to their own wellness and recovery. They will be guided to discuss their thoughts, feelings, and behaviors related to these obstacles. The group will process together ways to cope with barriers, with attention given to specific choices patients can make. Each patient will be challenged to identify changes they are motivated to make in order to overcome their obstacles. This group will be process-oriented, with patients participating in exploration of their own experiences as well as giving and receiving support and challenge from other group members.  Therapeutic Goals: 1. Patient will identify personal and current obstacles as they relate to admission. 2. Patient will identify barriers that currently interfere with their wellness or overcoming obstacles.  3. Patient will identify feelings, thought process and behaviors related to these barriers. 4. Patient will identify two changes they are willing to make to overcome these obstacles:    Summary of Patient Progress: Pt shared that family conflict and mental health diagnosis are his current obstacles.  Pt participated in group discussion about ways to take steps to overcome obstacles in life.        Therapeutic Modalities:   Cognitive Behavioral Therapy Solution Focused Therapy Motivational Interviewing Relapse Prevention Therapy  Daleen SquibbGreg Chrishelle Zito, LCSW

## 2018-07-09 NOTE — [facility] Group Notes (Signed)
[facility] Group Notes:  (Nursing/MHT/Case Management/Adjunct)  Date:  07/09/2018  Time:  3:03 PM  Type of Therapy:  Psychoeducational Skills  Participation Level:  Active  Participation Quality:  Appropriate  Affect:  Appropriate  Cognitive:  Appropriate  Insight:  Appropriate  Engagement in Group:  Engaged  Modes of Intervention:  Discussion, Education and Support  Summary of Progress/Problems:  Lynelle SmokeCara Travis Elmer Merwin 07/09/2018, 3:03 PM

## 2018-07-09 NOTE — [facility] Suicide Risk Assessment (Signed)
[facility] INPATIENT:  Family/Significant Other Suicide Prevention Education  Suicide Prevention Education:  Education Completed; Henry Shaffer, mother, [phone], has been identified by the patient as the family member/significant other with whom the patient will be residing, and identified as the person(s) who will aid the patient in the event of a mental health crisis (suicidal ideations/suicide attempt).  With written consent from the patient, the family member/significant other has been provided the following suicide prevention education, prior to the and/or following the discharge of the patient.  The suicide prevention education provided includes the following:  Suicide risk factors  Suicide prevention and interventions  National Suicide Hotline telephone number  [facility] assessment telephone number  [facility] Emergency Assistance 911  [facility] and/or [facility]idential [facility] Crisis Unit telephone number  Request made of family/significant other to:  Remove weapons (e.g., guns, rifles, knives), all items previously/currently identified as safety concern.  No guns in the home, per [place].  Remove drugs/medications (over-the-counter, prescriptions, illicit drugs), all items previously/currently identified as a safety concern.  The family member/significant other verbalizes understanding of the suicide prevention education information provided.  The family member/significant other agrees to remove the items of safety concern listed above.  Henry Shaffer, Henry Stallone Jon, LCSW 07/09/2018, 12:56 PM

## 2018-07-09 NOTE — Tx Team (Signed)
Interdisciplinary Treatment and Diagnostic Plan Update  07/04/2018 Time of Session: 0840 Henry Shaffer MRN: [id number]  Principal Diagnosis: Schizoaffective disorder, bipolar type [facility])  Secondary Diagnoses: Principal Problem:   Schizoaffective disorder, bipolar type (HCC)   Current Medications:  Current Facility-Administered Medications  Medication Dose Route Frequency Provider Last Rate Last Dose  . acetaminophen (TYLENOL) tablet 650 mg  650 mg Oral Q6H PRN Clapacs, John T, MD      . alum & mag hydroxide-simeth (MAALOX/MYLANTA) 200-200-20 MG/5ML suspension 30 mL  30 mL Oral Q4H PRN Clapacs, John T, MD      . amLODipine (NORVASC) tablet 10 mg  10 mg Oral Daily Clapacs, John T, MD   10 mg at 07/08/18 0900  . atorvastatin (LIPITOR) tablet 80 mg  80 mg Oral q1800 Clapacs, Jackquline Denmark, MD   80 mg at 07/08/18 1621  . chlorthalidone (HYGROTON) tablet 25 mg  25 mg Oral Daily Alford Highland, MD   25 mg at 07/08/18 0900  . cloNIDine (CATAPRES) tablet 0.1 mg  0.1 mg Oral BID PRN Hessie Knows, MD   0.1 mg at 07/04/18 1734  . cloNIDine (CATAPRES) tablet 0.2 mg  0.2 mg Oral BID Alford Highland, MD   0.2 mg at 07/09/18 0825  . divalproex (DEPAKOTE ER) 24 hr tablet 1,000 mg  1,000 mg Oral Daily Hessie Knows, MD   1,000 mg at 07/09/18 0825  . fluvoxaMINE (LUVOX) tablet 50 mg  50 mg Oral QHS Clapacs, John T, MD   50 mg at 07/08/18 2152  . hydrOXYzine (ATARAX/VISTARIL) tablet 50 mg  50 mg Oral TID PRN Clapacs, Jackquline Denmark, MD   50 mg at 07/08/18 2152  . insulin aspart (novoLOG) injection 0-5 Units  0-5 Units Subcutaneous QHS Wieting, Richard, MD      . insulin aspart (novoLOG) injection 0-9 Units  0-9 Units Subcutaneous TID WC Alford Highland, MD   2 Units at 07/09/18 0825  . linagliptin (TRADJENTA) tablet 5 mg  5 mg Oral Daily Alford Highland, MD   5 mg at 07/08/18 0900  . lisinopril (PRINIVIL,ZESTRIL) tablet 40 mg  40 mg Oral Daily Clapacs, Jackquline Denmark, MD   40 mg at 07/09/18 0824  . magnesium hydroxide  (MILK OF MAGNESIA) suspension 30 mL  30 mL Oral Daily PRN Clapacs, Jackquline Denmark, MD   30 mL at 07/06/18 0835  . metFORMIN (GLUCOPHAGE) tablet 1,000 mg  1,000 mg Oral BID WC Clapacs, Jackquline Denmark, MD   1,000 mg at 07/09/18 0824  . traZODone (DESYREL) tablet 50 mg  50 mg Oral QHS PRN Hessie Knows, MD   50 mg at 07/08/18 2152   PTA Medications: Medications Prior to Admission  Medication Sig Dispense Refill Last Dose  . albuterol (PROVENTIL HFA;VENTOLIN HFA) 108 (90 Base) MCG/ACT inhaler Inhale 2 puffs into the lungs every 6 (six) hours as needed for wheezing or shortness of breath.    Unknown at PRN  . amLODipine (NORVASC) 10 MG tablet Take 1 tablet (10 mg total) by mouth daily. 30 tablet 0 Unknown at Unknown  . ARIPiprazole (ABILIFY) 10 MG tablet Take 1 tablet (10 mg total) by mouth daily. 30 tablet 0 Unknown at Unknown  . atorvastatin (LIPITOR) 80 MG tablet Take 1 tablet (80 mg total) by mouth daily. 30 tablet 0 Unknown at Unknown  . cloNIDine (CATAPRES) 0.1 MG tablet Take 1 tablet (0.1 mg total) by mouth daily. (Patient taking differently: Take 0.1 mg by mouth 2 (two) times daily. ) 60 tablet 0 Unknown  at Unknown  . divalproex (DEPAKOTE) 500 MG DR tablet Take 500 mg by mouth daily.  2 Unknown at Unknown  . fluticasone (FLONASE) 50 MCG/ACT nasal spray Place 1 spray into both nostrils daily.  5 Unknown at Unknown  . fluvoxaMINE (LUVOX) 50 MG tablet Take 1 tablet (50 mg total) by mouth at bedtime. (Patient not taking: Reported on 11/07/2017) 30 tablet 0 Not Taking at Unknown time  . INVEGA SUSTENNA 156 MG/ML SUSY injection Inject 156 mg into the muscle every 28 (twenty-eight) days.  9 Unknown at Unknown  . lisinopril (PRINIVIL,ZESTRIL) 20 MG tablet Take 2 tablets (40 mg total) by mouth daily. 30 tablet 0 Unknown at Unknown  . metFORMIN (GLUCOPHAGE) 1000 MG tablet Take 1 tablet (1,000 mg total) by mouth 2 (two) times daily. 60 tablet 0 Unknown at Unknown  . traZODone (DESYREL) 50 MG tablet Take 1 tablet (50 mg  total) by mouth at bedtime. 30 tablet 0 Unknown at Unknown    Patient Stressors: Marital or family conflict Medication change or noncompliance  Patient Strengths: Motivation for treatment/growth Supportive family/friends  Treatment Modalities: Medication Management, Group therapy, Case management,  1 to 1 session with clinician, Psychoeducation, Recreational therapy.   Physician Treatment Plan for Primary Diagnosis: Schizoaffective disorder, bipolar type (HCC) Long Term Goal(s): Improvement in symptoms so as ready for discharge Improvement in symptoms so as ready for discharge   Short Term Goals: Ability to identify changes in lifestyle to reduce recurrence of condition will improve Ability to verbalize feelings will improve Compliance with prescribed medications will improve Ability to identify changes in lifestyle to reduce recurrence of condition will improve Ability to verbalize feelings will improve Compliance with prescribed medications will improve  Medication Management: Evaluate patient's response, side effects, and tolerance of medication regimen.  Therapeutic Interventions: 1 to 1 sessions, Unit Group sessions and Medication administration.  Evaluation of Outcomes: Progressing  Physician Treatment Plan for Secondary Diagnosis: Principal Problem:   Schizoaffective disorder, bipolar type (HCC)  Long Term Goal(s): Improvement in symptoms so as ready for discharge Improvement in symptoms so as ready for discharge   Short Term Goals: Ability to identify changes in lifestyle to reduce recurrence of condition will improve Ability to verbalize feelings will improve Compliance with prescribed medications will improve Ability to identify changes in lifestyle to reduce recurrence of condition will improve Ability to verbalize feelings will improve Compliance with prescribed medications will improve     Medication Management: Evaluate patient's response, side effects, and  tolerance of medication regimen.  Therapeutic Interventions: 1 to 1 sessions, Unit Group sessions and Medication administration.  Evaluation of Outcomes: Progressing   RN Treatment Plan for Primary Diagnosis: Schizoaffective disorder, bipolar type (HCC) Long Term Goal(s): Knowledge of disease and therapeutic regimen to maintain health will improve  Short Term Goals: Ability to identify and develop effective coping behaviors will improve and Compliance with prescribed medications will improve  Medication Management: RN will administer medications as ordered by provider, will assess and evaluate patient's response and provide education to patient for prescribed medication. RN will report any adverse and/or side effects to prescribing provider.  Therapeutic Interventions: 1 on 1 counseling sessions, Psychoeducation, Medication administration, Evaluate responses to treatment, Monitor vital signs and CBGs as ordered, Perform/monitor CIWA, COWS, AIMS and Fall Risk screenings as ordered, Perform wound care treatments as ordered.  Evaluation of Outcomes: Progressing   LCSW Treatment Plan for Primary Diagnosis: Schizoaffective disorder, bipolar type (HCC) Long Term Goal(s): Safe transition to appropriate next level of  care at discharge, Engage patient in therapeutic group addressing interpersonal concerns.  Short Term Goals: Engage patient in aftercare planning with referrals and resources, Increase social support and Increase skills for wellness and recovery  Therapeutic Interventions: Assess for all discharge needs, 1 to 1 time with Social worker, Explore available resources and support systems, Assess for adequacy in community support network, Educate family and significant other(s) on suicide prevention, Complete Psychosocial Assessment, Interpersonal group therapy.  Evaluation of Outcomes: Progressing   Progress in Treatment: Attending groups: Yes. Participating in groups: Yes. Taking  medication as prescribed: Yes. Toleration medication: Yes. Family/Significant other contact made: No, will contact:  mother Patient understands diagnosis: Yes. Discussing patient identified problems/goals with staff: Yes. Medical problems stabilized or resolved: Yes. Denies suicidal/homicidal ideation: Yes. Issues/concerns per patient self-inventory: No. Other: none  New problem(s) identified: No, Describe:  none  New Short Term/Long Term Goal(s):  Patient Goals:  "get better, live peacefully with my family"  Discharge Plan or Barriers:   Reason for Continuation of Hospitalization: Delusions  Hallucinations Medication stabilization  Estimated Length of Stay: 1-3 days.  Recreational Therapy: Patient Stressors: N/A Patient Goal: Patient will identify 2 ways of improving communication post d/c within 5 recreation therapy group sessions  Attendees: Patient: 07/09/2018   Physician: Dr. Flora Lipps'Neal, MD 07/09/2018   Nursing:  07/09/2018   RN Care Manager: 07/09/2018   Social Worker: Daleen SquibbGreg Daemien Fronczak, LCSW Lowella Dandyarren Livingston, LCSW 07/09/2018   Recreational Therapist: Garret ReddishShay Outlaw, LTRS 07/09/2018   Other:  07/09/2018   Other:  07/09/2018   Other: 07/09/2018           Scribe for Treatment Team: Lorri FrederickWierda, Durenda Pechacek Jon, LCSW 07/09/2018 11:51 AM

## 2018-07-09 NOTE — Progress Notes (Signed)
D: Pt denies SI/HI/AVH, can contract for safety. Pt is pleasant and cooperative, but a bit anxious this evening, given PRN medication for this. Pt. Frequently visible around the milieu socializing with peers and staff appropriately. No behavioral concerns to report.   A: Q x 15 minute observation checks were completed for safety. Patient was provided with education, but needs reinforcement.  Patient was given/offered medications per orders. Patient  was encourage to attend groups, participate in unit activities and continue with plan of care. Pt. Chart and plans of care reviewed. Pt. Given support and encouragement.   R: Patient is complaint with medication and unit procedures with direction and encouragement. Pt. Attends snack, group, and unit activities appropriate. Pt. orthostatic vital signs and blood sugars monitored for safety.             Precautionary checks every 15 minutes for safety maintained, room free of safety hazards, patient sustains no injury or falls during this shift. Will endorse care to next shift.

## 2018-07-09 NOTE — Progress Notes (Signed)
Inpatient Diabetes Program Recommendations  AACE/ADA: New Consensus Statement on Inpatient Glycemic Control (2015)  Target Ranges:  Prepandial:   less than 140 mg/dL      Peak postprandial:   less than 180 mg/dL (1-2 hours)      Critically ill patients:  140 - 180 mg/dL   Lab Results  Component Value Date   GLUCAP 176 (H) 07/09/2018   HGBA1C 8.7 (H) 07/04/2018    Review of Glycemic Control Results for Henry Shaffer, Henry Shaffer (MRN [id number]) as of 07/09/2018 13:31  Ref. Range 07/08/2018 11:29 07/08/2018 16:18 07/08/2018 21:10 07/09/2018 07:05 07/09/2018 11:24  Glucose-Capillary Latest Ref Range: 70 - 99 mg/dL 782188 (H) 956102 (H) 213135 (H) 162 (H) 176 (H)   Diabetes history: DM 2 Outpatient Diabetes medications:  Metformin 1000 mg bid Current orders for Inpatient glycemic control:  Novolog sensitive tid with meals and HS, Tradjenta 5 mg daily, Metformin 1000 mg bid  Inpatient Diabetes Program Recommendations:    Spoke with MD regarding patient.  Upon review of chart, it appears that Jearld Leschradjenta has been added during this hospitalization due to elevated blood sugars and A1C.  Based on A1C and current blood sugars, hopefully will be able to only d/c on oral diabetes medications.  Upon medication review, Jearld Leschradjenta is on [facility].   Thanks,  Beryl MeagerJenny Kabrina Christiano, RN, BC-ADM Inpatient Diabetes Coordinator Pager [phone] (8a-5p)

## 2018-07-10 LAB — GLUCOSE, CAPILLARY: Glucose-Capillary: 141 mg/dL — ABNORMAL HIGH (ref 70–99)

## 2018-07-10 LAB — VALPROIC ACID LEVEL: Valproic Acid Lvl: 38 ug/mL — ABNORMAL LOW (ref 50.0–100.0)

## 2018-07-10 LAB — AMMONIA: Ammonia: 22 umol/L (ref 9–35)

## 2018-07-10 NOTE — Plan of Care (Signed)
  Problem: Education: Goal: Knowledge of [facility] [place] [facility] Education information/materials will improve Outcome: Completed/Met Goal: Emotional status will improve Outcome: Completed/Met Goal: Mental status will improve Outcome: Completed/Met Goal: Verbalization of understanding the information provided will improve Outcome: Completed/Met   Problem: Safety: Goal: Periods of time without injury will increase Outcome: Completed/Met   Problem: Education: Goal: Will be free of psychotic symptoms Outcome: Completed/Met Goal: Knowledge of the prescribed therapeutic regimen will improve Outcome: Completed/Met   Problem: Safety: Goal: Ability to redirect hostility and anger into socially appropriate behaviors will improve Outcome: Completed/Met Goal: Ability to remain free from injury will improve Outcome: Completed/Met

## 2018-07-10 NOTE — [facility] Group Notes (Signed)
Feelings Around Diagnosis 07/10/2018 1PM  Type of Therapy/Topic:  Group Therapy:  Feelings about Diagnosis  Participation Level:  Did Not Attend   Description of Group:   This group will allow patients to explore their thoughts and feelings about diagnoses they have received. Patients will be guided to explore their level of understanding and acceptance of these diagnoses. Facilitator will encourage patients to process their thoughts and feelings about the reactions of others to their diagnosis and will guide patients in identifying ways to discuss their diagnosis with significant others in their lives. This group will be process-oriented, with patients participating in exploration of their own experiences, giving and receiving support, and processing challenge from other group members.   Therapeutic Goals: 1. Patient will demonstrate understanding of diagnosis as evidenced by identifying two or more symptoms of the disorder 2. Patient will be able to express two feelings regarding the diagnosis 3. Patient will demonstrate their ability to communicate their needs through discussion and/or role play  Summary of Patient Progress:       Therapeutic Modalities:   Cognitive Behavioral Therapy Brief Therapy Feelings Identification    Suzan SlickDARREN T Acelynn Dejonge, LCSW 07/10/2018 2:03 PM
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
## 2018-07-10 NOTE — Progress Notes (Signed)
Patient ID: Henry Shaffer, male   DOB: 08/05/1975, 42 y.o.   MRN: [id number] DAR Note: Pt observed in the dayroom interacting with staff. Pt assessment Pt denied anxiety, depression, pain AVH of SI/HI. Support, encouragement, and safe environment provided. All Pt questions and concerns addressed. Pt was med compliant. 15-minute safety checks continue.

## 2018-07-10 NOTE — Progress Notes (Signed)
Recreation Therapy Notes  Date: 07/10/2018  Time: 9:30 pm   Location: Craft Room   Behavioral response: N/A   Intervention Topic: Communication  Discussion/Intervention: Patient did not attend group.   Clinical Observations/Feedback:  Patient did not attend group.   [name] LRT/CTRS        Henry Shaffer 07/10/2018 11:36 AM 

## 2018-07-10 NOTE — Progress Notes (Signed)
  [facility] :  Will you be returning to the same living situation after discharge:  Yes,  with mother At discharge, do you have transportation home?: Yes,  mother Do you have the ability to pay for your medications: Yes,  [employer] Medicare  Release of information consent forms completed and in the chart;  Patient's signature needed at discharge.  Patient to Follow up at: Follow-up Information    [facility]. Go on 07/11/2018.   Why:  Please attend your intake appt on Wednesday, 07/01/18, at 10:00am. [address] information[address], [address] [place], [place] [address] [phone]       [facility]. Go on 07/30/2018.   Why:  Please attend your primary care appt with Dr. Forrestine HimFeltner on Monday, Jul 30, 2018, at 2:20pm.  Contact information: [address] [place], [place] [address] P: [phone] F: [phone]          Next level of care provider [facility]:no  Safety Planning and Suicide Prevention discussed: Yes,  with mother  Have you used any form of tobacco in the last 30 days? (Cigarettes, Smokeless Tobacco, Cigars, and/or Pipes): No  Has patient been referred to the Quitline?: N/A patient is not a smoker  Patient has been referred for addiction treatment: N/A  Lorri FrederickWierda, Avriel Kandel Jon, LCSW 07/10/2018, 9:30 AM
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
## 2018-07-10 NOTE — Progress Notes (Signed)
Nursing discharge note: Patient discharged home per MD order.  Patient received all personal belongings from unit and locker. He received medications from the pharmacy (meds he came in with).  His medications can be picked up at [facility] in [place], [place]. Reviewed AVS/transition record with patient and he indicates understanding.  [facility] and [facility].  Patient denies any thoughts of self harm.  He left ambulatory with his mother.
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
## 2018-07-10 NOTE — Discharge Instructions (Signed)
Blood Glucose Monitoring, Adult °Monitoring your blood sugar (glucose) helps you manage your diabetes. It also helps you and your health care provider determine how well your diabetes management plan is working. Blood glucose monitoring involves checking your blood glucose as often as directed, and keeping a record (log) of your results over time. °Why should I monitor my blood glucose? °Checking your blood glucose regularly can: °· Help you understand how food, exercise, illnesses, and medicines affect your blood glucose. °· Let you know what your blood glucose is at any time. You can quickly tell if you are having low blood glucose (hypoglycemia) or high blood glucose (hyperglycemia). °· Help you and your health care provider adjust your medicines as needed. ° °When should I check my blood glucose? °Follow instructions from your health care provider about how often to check your blood glucose. This may depend on: °· The type of diabetes you have. °· How well-controlled your diabetes is. °· Medicines you are taking. ° °If you have type 1 diabetes: °· Check your blood glucose at least 2 times a day. °· Also check your blood glucose: °? Before every insulin injection. °? Before and after exercise. °? Between meals. °? 2 hours after a meal. °? Occasionally between 2:00 a.m. and 3:00 a.m., as directed. °? Before potentially dangerous tasks, like driving or using heavy machinery. °? At bedtime. °· You may need to check your blood glucose more often, up to 6-10 times a day: °? If you use an insulin pump. °? If you need multiple daily injections (MDI). °? If your diabetes is not well-controlled. °? If you are ill. °? If you have a history of severe hypoglycemia. °? If you have a history of not knowing when your blood glucose is getting low (hypoglycemia unawareness). °If you have type 2 diabetes: °· If you take insulin or other diabetes medicines, check your blood glucose at least 2 times a day. °· If you are on intensive  insulin therapy, check your blood glucose at least 4 times a day. Occasionally, you may also need to check between 2:00 a.m. and 3:00 a.m., as directed. °· Also check your blood glucose: °? Before and after exercise. °? Before potentially dangerous tasks, like driving or using heavy machinery. °· You may need to check your blood glucose more often if: °? Your medicine is being adjusted. °? Your diabetes is not well-controlled. °? You are ill. °What is a blood glucose log? °· A blood glucose log is a record of your blood glucose readings. It helps you and your health care provider: °? Look for patterns in your blood glucose over time. °? Adjust your diabetes management plan as needed. °· Every time you check your blood glucose, write down your result and notes about things that may be affecting your blood glucose, such as your diet and exercise for the day. °· Most glucose meters store a record of glucose readings in the meter. Some meters allow you to download your records to a computer. °How do I check my blood glucose? °Follow these steps to get accurate readings of your blood glucose: °Supplies needed ° °· Blood glucose meter. °· Test strips for your meter. Each meter has its own strips. You must use the strips that come with your meter. °· A needle to prick your finger (lancet). Do not use lancets more than once. °· A device that holds the lancet (lancing device). °· A journal or log book to write down your results. °Procedure °·   Wash your hands with soap and water.  Prick the side of your finger (not the tip) with the lancet. Use a different finger each time.  Gently rub the finger until a small drop of blood appears.  Follow instructions that come with your meter for inserting the test strip, applying blood to the strip, and using your blood glucose meter.  Write down your result and any notes. Alternative testing sites  Some meters allow you to use areas of your body other than your finger  (alternative sites) to test your blood.  If you think you may have hypoglycemia, or if you have hypoglycemia unawareness, do not use alternative sites. Use your finger instead.  Alternative sites may not be as accurate as the fingers, because blood flow is slower in these areas. This means that the result you get may be delayed, and it may be different from the result that you would get from your finger.  The most common alternative sites are: ? Forearm. ? Thigh. ? Palm of the hand. Additional tips  Always keep your supplies with you.  If you have questions or need help, all blood glucose meters have a 24-hour hotline number that you can call. You may also contact your health care provider.  After you use a few   Coping With Diabetes Diabetes (type 1 diabetes mellitus or type 2 diabetes mellitus) is a condition in which the body does not have enough of a hormone called insulin, or the body does not respond properly to insulin. Normally, insulin allows sugars (glucose) to enter cells in the body. The cells use glucose for energy. With diabetes, extra glucose builds up in the blood instead of going into cells, which results in high blood glucose (hyperglycemia). How to manage lifestyle changes Managing diabetes includes medical treatments as well as lifestyle changes. If diabetes is not managed well, serious physical and emotional complications can occur. Taking good care of yourself means that you are responsible for: Monitoring glucose regularly. Eating a healthy diet. Exercising regularly. Meeting with health care providers. Taking medicines as directed.  Some people may feel a lot of stress about managing their diabetes. This is known as emotional distress, and it is very common. Living with diabetes can place you at risk for emotional distress, depression, or anxiety. These disorders can be confusing and can make diabetes management more difficult. How to recognize stress Emotional  distress Symptoms of emotional distress include: Anger about having a diagnosis of diabetes. Fear or frustration about your diagnosis and the changes you need to make to manage the condition. Being overly worried about the care that you need or the cost of the care you need. Feeling like you caused your condition by doing something wrong. Fear of unpredictable situations, like low or high blood glucose. Feeling judged by your health care providers. Feeling very alone with the disease. Getting too tired or "burned out" with the demands of daily care.  Depression Having diabetes means that you are at a higher risk for depression. Having depression also means that you are at a higher risk for diabetes. Your health care provider may test (screen) you for symptoms of depression. It is important to recognize depression symptoms and to start treatment for it soon after it is diagnosed. The following are some symptoms of depression: Loss of interest in things that you used to enjoy. Trouble sleeping, or often waking up early and not being able to get back to sleep. A change in appetite. Feeling  tired most of the day. Feeling nervous and anxious. Feeling guilty and worrying that you are a burden to others. Feeling depressed more often than you do not feel that way. Thoughts of hurting yourself or feeling that you want to die.  If you have any of these symptoms for 2 weeks or longer, reach out to a health care provider. Where to find support Ask your health care provider to recommend a therapist who understands both depression and diabetes. Search for information and support from the American Diabetes Association: www.diabetes.org Find a certified diabetes educator and make an appointment through American Association of Diabetes Educators: www.diabeteseducator.org Follow these instructions at home: Managing emotional distress The following are some ways to manage emotional distress: Talk with  your health care provider or certified diabetes educator. Consider working with a counselor or therapist. Learn as much as you can about diabetes and its treatment. Meet with a certified diabetes educator or take a class to learn how to manage your condition. Keep a journal of your thoughts and concerns. Accept that some things are out of your control. Talk with other people who have diabetes. It can help to talk with others about the emotional distress that you feel. Find ways to manage stress that work for you. These may include art or music therapy, exercise, meditation, and hobbies. Seek support from spiritual leaders, family, and friends.  General instructions Follow your diabetes management plan. Keep all follow-up visits as told by your health care provider. This is important. Get help right away if: You have thoughts about hurting yourself or others. If you ever feel like you may hurt yourself or others, or have thoughts about taking your own life, get help right away. You can go to your nearest emergency department or call: Your local emergency services (911 in the [place] A suicide crisis helpline, such as the National Suicide Prevention Lifeline at (602)713-16181-681 432 4828. This is open 24 hours a day.  Summary Diabetes (type 1 diabetes mellitus or type 2 diabetes mellitus) is a condition in which the body does not have enough of a hormone called insulin, or the body does not respond properly to insulin. Living with diabetes puts you at risk for medical issues, and it also puts you at risk for emotional issues such as emotional distress, depression, and anxiety. Recognizing the symptoms of emotional distress and depression may help you avoid problems with your diabetes control. It is important to start treatment for emotional distress and depression soon after they are diagnosed. Having diabetes means that you are at a higher risk for depression. Ask your health care provider to recommend a  therapist who understands both depression and diabetes. If you experience symptoms of emotional distress or depression, it is important to discuss this with your health care provider, certified diabetes educator, or therapist. This information is not intended to replace advice given to you by your health care provider. Make sure you discuss any questions you have with your health care provider. Document Released: 11/24/2016 Document Revised: 11/24/2016 Document Reviewed: 11/24/2016 Elsevier Interactive Patient Education  2018 [employer]  boxes of test strips, adjust (calibrate) your blood glucose meter by following instructions that came with your meter. This information is not intended to replace advice given to you by your health care provider. Make sure you discuss any questions you have with your health care provider. Document Released: 07/14/2003 Document Revised: 01/29/2016 Document Reviewed: 12/21/2015 Elsevier Interactive Patient Education  2017 [employer]

## 2018-07-10 NOTE — Progress Notes (Signed)
Recreation Therapy Notes  INPATIENT RECREATION TR PLAN  Patient Details Name: [name] MRN: [id number] DOB: 10-May-1976 Today's Date: 07/10/2018  Rec Therapy Plan Is patient appropriate for Therapeutic Recreation?: Yes Treatment times per week: at least 3 Estimated Length of Stay: 5-7 days TR Treatment/Interventions: Group participation (Comment)  Discharge Criteria Pt will be discharged from therapy if:: Discharged Treatment plan/goals/alternatives discussed and agreed upon by:: Patient/family  Discharge Summary Short term goals set:  Patient will identify 2 ways of improving communication post d/c within 5 recreation therapy group sessions Short term goals met: Adequate for discharge Progress toward goals comments: Groups attended Which groups?: Coping skills, Other (Comment)(Emotions) Reason goals not met: N/A Therapeutic equipment acquired: N/A Reason patient discharged from therapy: Discharge from hospital Pt/family agrees with progress & goals achieved: Yes Date patient discharged from therapy: 07/10/18   Henry Shaffer 07/10/2018, 3:14 PM

## 2018-07-11 ENCOUNTER — Encounter
Admit: 2018-07-11 | Discharge: 2018-07-12 | Payer: MEDICARE | Attending: Student in an Organized Health Care Education/Training Program | Primary: Student in an Organized Health Care Education/Training Program

## 2018-07-11 DIAGNOSIS — F5105 Insomnia due to other mental disorder: Secondary | ICD-10-CM

## 2018-07-11 DIAGNOSIS — F250 Schizoaffective disorder, bipolar type: Principal | ICD-10-CM

## 2018-07-11 DIAGNOSIS — F99 Mental disorder, not otherwise specified: Secondary | ICD-10-CM

## 2018-07-11 DIAGNOSIS — Z79899 Other long term (current) drug therapy: Secondary | ICD-10-CM

## 2018-07-11 MED ORDER — PALIPERIDONE PALMITATE 156 MG/ML INTRAMUSCULAR SYRINGE
INTRAMUSCULAR | 11 refills | 0 days | Status: CP
Start: 2018-07-11 — End: ?

## 2018-07-11 MED ORDER — DIVALPROEX ER 500 MG TABLET,EXTENDED RELEASE 24 HR
ORAL_TABLET | Freq: Every day | ORAL | 3 refills | 0 days | Status: CP
Start: 2018-07-11 — End: 2018-10-24

## 2018-07-13 ENCOUNTER — Ambulatory Visit: Admit: 2018-07-13 | Discharge: 2018-07-14 | Disposition: A | Discharge status: Home / Self Care | Payer: MEDICARE

## 2018-07-13 ENCOUNTER — Encounter: Admit: 2018-07-13 | Discharge: 2018-07-14 | Disposition: A | Discharge status: Home / Self Care | Payer: MEDICARE

## 2018-07-13 DIAGNOSIS — R45850 Homicidal ideations: Principal | ICD-10-CM

## 2018-07-30 ENCOUNTER — Encounter: Admit: 2018-07-30 | Discharge: 2018-07-31 | Payer: MEDICARE | Attending: Psychiatry | Primary: Psychiatry

## 2018-07-30 ENCOUNTER — Encounter: Admit: 2018-07-30 | Discharge: 2018-07-30 | Payer: MEDICARE

## 2018-07-30 DIAGNOSIS — F250 Schizoaffective disorder, bipolar type: Principal | ICD-10-CM

## 2018-07-30 DIAGNOSIS — R252 Cramp and spasm: Principal | ICD-10-CM

## 2018-07-30 DIAGNOSIS — I10 Essential (primary) hypertension: Secondary | ICD-10-CM

## 2018-07-30 DIAGNOSIS — E1100 Type 2 diabetes mellitus with hyperosmolarity without nonketotic hyperglycemic-hyperosmolar coma (NKHHC): Secondary | ICD-10-CM

## 2018-08-01 ENCOUNTER — Other Ambulatory Visit: Payer: Self-pay | Admitting: Psychiatry

## 2018-08-29 ENCOUNTER — Encounter
Admit: 2018-08-29 | Discharge: 2018-08-30 | Payer: MEDICARE | Attending: Student in an Organized Health Care Education/Training Program | Primary: Student in an Organized Health Care Education/Training Program

## 2018-08-29 DIAGNOSIS — F99 Mental disorder, not otherwise specified: Secondary | ICD-10-CM

## 2018-08-29 DIAGNOSIS — F250 Schizoaffective disorder, bipolar type: Principal | ICD-10-CM

## 2018-08-29 DIAGNOSIS — Z79899 Other long term (current) drug therapy: Secondary | ICD-10-CM

## 2018-08-29 DIAGNOSIS — F5105 Insomnia due to other mental disorder: Secondary | ICD-10-CM

## 2018-08-29 MED ORDER — NICOTINE (POLACRILEX) 4 MG BUCCAL LOZENGE
2 refills | 0 days | Status: CP
Start: 2018-08-29 — End: ?

## 2018-09-26 ENCOUNTER — Encounter: Admit: 2018-09-26 | Discharge: 2018-09-27 | Payer: MEDICARE

## 2018-09-26 DIAGNOSIS — G039 Meningitis, unspecified: Principal | ICD-10-CM

## 2018-09-26 DIAGNOSIS — F250 Schizoaffective disorder, bipolar type: Principal | ICD-10-CM

## 2018-09-26 DIAGNOSIS — J45909 Unspecified asthma, uncomplicated: Principal | ICD-10-CM

## 2018-09-26 DIAGNOSIS — J349 Unspecified disorder of nose and nasal sinuses: Principal | ICD-10-CM

## 2018-09-26 DIAGNOSIS — F329 Major depressive disorder, single episode, unspecified: Principal | ICD-10-CM

## 2018-09-26 DIAGNOSIS — I639 Cerebral infarction, unspecified: Principal | ICD-10-CM

## 2018-09-26 DIAGNOSIS — I10 Essential (primary) hypertension: Principal | ICD-10-CM

## 2018-09-26 DIAGNOSIS — G510 Bell's palsy: Principal | ICD-10-CM

## 2018-09-26 DIAGNOSIS — I519 Heart disease, unspecified: Principal | ICD-10-CM

## 2018-09-26 DIAGNOSIS — I214 Non-ST elevation (NSTEMI) myocardial infarction: Principal | ICD-10-CM

## 2018-09-26 DIAGNOSIS — H9190 Unspecified hearing loss, unspecified ear: Principal | ICD-10-CM

## 2018-09-26 DIAGNOSIS — E119 Type 2 diabetes mellitus without complications: Principal | ICD-10-CM

## 2018-10-22 MED ORDER — FLUTICASONE PROPIONATE 50 MCG/ACTUATION NASAL SPRAY,SUSPENSION
Freq: Every day | NASAL | 5 refills | 0.00000 days | Status: CP
Start: 2018-10-22 — End: ?

## 2018-10-24 ENCOUNTER — Encounter
Admit: 2018-10-24 | Discharge: 2018-10-25 | Payer: MEDICARE | Attending: Student in an Organized Health Care Education/Training Program | Primary: Student in an Organized Health Care Education/Training Program

## 2018-10-24 DIAGNOSIS — Z79899 Other long term (current) drug therapy: Secondary | ICD-10-CM

## 2018-10-24 DIAGNOSIS — F250 Schizoaffective disorder, bipolar type: Principal | ICD-10-CM

## 2018-10-24 DIAGNOSIS — F5105 Insomnia due to other mental disorder: Secondary | ICD-10-CM

## 2018-10-24 DIAGNOSIS — F99 Mental disorder, not otherwise specified: Secondary | ICD-10-CM

## 2018-10-24 DIAGNOSIS — F17200 Nicotine dependence, unspecified, uncomplicated: Secondary | ICD-10-CM

## 2018-10-24 MED ORDER — DIVALPROEX ER 500 MG TABLET,EXTENDED RELEASE 24 HR
ORAL_TABLET | Freq: Every day | ORAL | 3 refills | 0.00000 days | Status: CP
Start: 2018-10-24 — End: 2019-01-22

## 2018-12-05 ENCOUNTER — Encounter: Admit: 2018-12-05 | Discharge: 2018-12-06 | Payer: MEDICARE | Attending: Psychiatry | Primary: Psychiatry

## 2018-12-10 MED ORDER — CLONIDINE HCL 0.1 MG TABLET
ORAL_TABLET | Freq: Two times a day (BID) | ORAL | 0 refills | 0 days | Status: CP
Start: 2018-12-10 — End: 2019-03-06

## 2018-12-27 ENCOUNTER — Encounter
Admit: 2018-12-27 | Discharge: 2018-12-28 | Disposition: A | Discharge status: Home / Self Care | Payer: MEDICARE | Attending: Emergency Medicine

## 2018-12-27 MED ORDER — MICONAZOLE NITRATE 2 % TOPICAL CREAM
Freq: Two times a day (BID) | TOPICAL | 0 refills | 0.00000 days | Status: CP
Start: 2018-12-27 — End: 2019-01-03

## 2019-01-09 ENCOUNTER — Encounter: Admit: 2019-01-09 | Discharge: 2019-01-10 | Payer: MEDICARE

## 2019-01-28 MED ORDER — DIVALPROEX ER 500 MG TABLET,EXTENDED RELEASE 24 HR
ORAL_TABLET | Freq: Every day | ORAL | 3 refills | 0 days | Status: CP
Start: 2019-01-28 — End: 2019-05-28

## 2019-03-06 MED ORDER — CLONIDINE HCL 0.1 MG TABLET
ORAL_TABLET | Freq: Two times a day (BID) | ORAL | 1 refills | 90 days | Status: CP
Start: 2019-03-06 — End: 2020-03-05

## 2019-03-21 MED ORDER — ALBUTEROL SULFATE HFA 90 MCG/ACTUATION AEROSOL INHALER
Freq: Four times a day (QID) | RESPIRATORY_TRACT | 3 refills | 0 days | Status: CP | PRN
Start: 2019-03-21 — End: 2020-03-20

## 2019-04-16 ENCOUNTER — Encounter: Admit: 2019-04-16 | Discharge: 2019-04-16 | Payer: MEDICARE

## 2019-04-16 DIAGNOSIS — E1100 Type 2 diabetes mellitus with hyperosmolarity without nonketotic hyperglycemic-hyperosmolar coma (NKHHC): Secondary | ICD-10-CM

## 2019-04-16 DIAGNOSIS — R609 Edema, unspecified: Secondary | ICD-10-CM

## 2019-04-16 DIAGNOSIS — R0600 Dyspnea, unspecified: Secondary | ICD-10-CM

## 2019-04-16 DIAGNOSIS — J40 Bronchitis, not specified as acute or chronic: Secondary | ICD-10-CM

## 2019-04-16 DIAGNOSIS — I10 Essential (primary) hypertension: Secondary | ICD-10-CM

## 2019-04-16 DIAGNOSIS — F17200 Nicotine dependence, unspecified, uncomplicated: Secondary | ICD-10-CM

## 2019-04-16 MED ORDER — AZITHROMYCIN 250 MG TABLET
ORAL_TABLET | 0 refills | 5 days | Status: CP
Start: 2019-04-16 — End: 2019-04-21

## 2019-04-16 MED ORDER — METFORMIN 1,000 MG TABLET
ORAL_TABLET | Freq: Two times a day (BID) | ORAL | 3 refills | 90 days | Status: CP
Start: 2019-04-16 — End: 2020-04-10

## 2019-04-16 MED ORDER — TIOTROPIUM BROMIDE 18 MCG CAPSULE WITH INHALATION DEVICE
ORAL_CAPSULE | Freq: Every day | RESPIRATORY_TRACT | 11 refills | 30.00000 days | Status: CP
Start: 2019-04-16 — End: 2020-04-15

## 2019-04-16 MED ORDER — LISINOPRIL 40 MG TABLET
ORAL_TABLET | Freq: Every day | ORAL | 3 refills | 360.00000 days | Status: CP
Start: 2019-04-16 — End: 2020-04-10

## 2019-04-22 MED ORDER — DIVALPROEX ER 500 MG TABLET,EXTENDED RELEASE 24 HR
ORAL_TABLET | 1 refills | 0 days | Status: CP
Start: 2019-04-22 — End: ?

## 2019-04-25 MED ORDER — FLUTICASONE PROPIONATE 50 MCG/ACTUATION NASAL SPRAY,SUSPENSION
5 refills | 0 days | Status: CP
Start: 2019-04-25 — End: ?

## 2019-04-26 MED ORDER — PALIPERIDONE ER 6 MG TABLET,EXTENDED RELEASE 24 HR
ORAL_TABLET | Freq: Every morning | ORAL | 0 refills | 30.00000 days | Status: CP
Start: 2019-04-26 — End: 2019-05-26

## 2019-04-28 ENCOUNTER — Other Ambulatory Visit: Payer: Self-pay

## 2019-04-28 ENCOUNTER — Emergency Department
Admission: EM | Admit: 2019-04-28 | Discharge: 2019-04-29 | Disposition: A | Discharge status: Other Acute Inpatient Hospital | Payer: Medicare HMO | Attending: Student | Admitting: Student

## 2019-04-28 DIAGNOSIS — F419 Anxiety disorder, unspecified: Secondary | ICD-10-CM | POA: Diagnosis not present

## 2019-04-28 DIAGNOSIS — F329 Major depressive disorder, single episode, unspecified: Secondary | ICD-10-CM | POA: Insufficient documentation

## 2019-04-28 DIAGNOSIS — Z046 Encounter for general psychiatric examination, requested by authority: Secondary | ICD-10-CM

## 2019-04-28 DIAGNOSIS — F203 Undifferentiated schizophrenia: Secondary | ICD-10-CM | POA: Diagnosis not present

## 2019-04-28 DIAGNOSIS — R45851 Suicidal ideations: Secondary | ICD-10-CM | POA: Diagnosis not present

## 2019-04-28 DIAGNOSIS — Z79899 Other long term (current) drug therapy: Secondary | ICD-10-CM | POA: Insufficient documentation

## 2019-04-28 DIAGNOSIS — I10 Essential (primary) hypertension: Secondary | ICD-10-CM | POA: Diagnosis not present

## 2019-04-28 DIAGNOSIS — E119 Type 2 diabetes mellitus without complications: Secondary | ICD-10-CM | POA: Diagnosis not present

## 2019-04-28 DIAGNOSIS — Z20828 Contact with and (suspected) exposure to other viral communicable diseases: Secondary | ICD-10-CM | POA: Insufficient documentation

## 2019-04-28 DIAGNOSIS — F17210 Nicotine dependence, cigarettes, uncomplicated: Secondary | ICD-10-CM | POA: Diagnosis not present

## 2019-04-28 DIAGNOSIS — Z7984 Long term (current) use of oral hypoglycemic drugs: Secondary | ICD-10-CM | POA: Insufficient documentation

## 2019-04-28 LAB — COMPREHENSIVE METABOLIC PANEL
ALT: 39 U/L (ref 0–44)
AST: 24 U/L (ref 15–41)
Albumin: 3.9 g/dL (ref 3.5–5.0)
Alkaline Phosphatase: 67 U/L (ref 38–126)
Anion gap: 11 (ref 5–15)
BUN: 12 mg/dL (ref 6–20)
CO2: 27 mmol/L (ref 22–32)
Calcium: 8.9 mg/dL (ref 8.9–10.3)
Chloride: 99 mmol/L (ref 98–111)
Creatinine, Ser: 1.15 mg/dL (ref 0.61–1.24)
GFR calc Af Amer: >60 mL/min (ref >60)
GFR calc non Af Amer: >60 mL/min (ref >60)
Glucose, Bld: 197 mg/dL — ABNORMAL HIGH (ref 70–99)
Potassium: 3.7 mmol/L (ref 3.5–5.1)
Sodium: 137 mmol/L (ref 135–145)
Total Bilirubin: 0.8 mg/dL (ref 0.3–1.2)
Total Protein: 6.7 g/dL (ref 6.5–8.1)

## 2019-04-28 LAB — CBC
HCT: 43 % (ref 39.0–52.0)
Hemoglobin: 14.6 g/dL (ref 13.0–17.0)
MCH: 28.9 pg (ref 26.0–34.0)
MCHC: 34 g/dL (ref 30.0–36.0)
MCV: 85.1 fL (ref 80.0–100.0)
Platelets: 254 10*3/uL (ref 150–400)
RBC: 5.05 MIL/uL (ref 4.22–5.81)
RDW: 13.2 % (ref 11.5–15.5)
WBC: 11.4 10*3/uL — ABNORMAL HIGH (ref 4.0–10.5)
nRBC: 0 % (ref 0.0–0.2)

## 2019-04-28 LAB — URINE DRUG SCREEN, QUALITATIVE (ARMC ONLY)
Amphetamines, Ur Screen: NOT DETECTED
Barbiturates, Ur Screen: NOT DETECTED
Benzodiazepine, Ur Scrn: NOT DETECTED
Cannabinoid 50 Ng, Ur ~~LOC~~: NOT DETECTED
Cocaine Metabolite,Ur ~~LOC~~: NOT DETECTED
MDMA (Ecstasy)Ur Screen: NOT DETECTED
Methadone Scn, Ur: NOT DETECTED
Opiate, Ur Screen: NOT DETECTED
Phencyclidine (PCP) Ur S: NOT DETECTED
Tricyclic, Ur Screen: NOT DETECTED

## 2019-04-28 LAB — SALICYLATE LEVEL: Salicylate Lvl: <7 mg/dL (ref 2.8–30.0)

## 2019-04-28 LAB — VALPROIC ACID LEVEL: Valproic Acid Lvl: 41 ug/mL — ABNORMAL LOW (ref 50.0–100.0)

## 2019-04-28 LAB — ACETAMINOPHEN LEVEL: Acetaminophen (Tylenol), Serum: <10 ug/mL — ABNORMAL LOW (ref 10–30)

## 2019-04-28 LAB — ETHANOL: Alcohol, Ethyl (B): <10 mg/dL (ref <10)

## 2019-04-28 MED ORDER — CLONIDINE HCL 0.1 MG PO TABS
0.2000 mg | ORAL_TABLET | Freq: Two times a day (BID) | ORAL | Status: DC
  Administered 2019-04-28 – 2019-04-29 (×2): 0.2 mg via ORAL
  Filled 2019-04-28 (×2): qty 2

## 2019-04-28 MED ORDER — AMLODIPINE BESYLATE 5 MG PO TABS
10.0000 mg | ORAL_TABLET | Freq: Every day | ORAL | Status: DC
  Administered 2019-04-28 – 2019-04-29 (×2): 10 mg via ORAL
  Filled 2019-04-28 (×2): qty 2

## 2019-04-28 MED ORDER — DIVALPROEX SODIUM ER 500 MG PO TB24
1000.0000 mg | ORAL_TABLET | Freq: Every day | ORAL | Status: DC
  Administered 2019-04-29: 09:00:00 1000 mg via ORAL
  Filled 2019-04-28: qty 2

## 2019-04-28 MED ORDER — ARIPIPRAZOLE 10 MG PO TABS
10.0000 mg | ORAL_TABLET | Freq: Every day | ORAL | Status: DC
  Administered 2019-04-29: 10 mg via ORAL
  Filled 2019-04-28 (×2): qty 1

## 2019-04-28 MED ORDER — LISINOPRIL 20 MG PO TABS
40.0000 mg | ORAL_TABLET | Freq: Every day | ORAL | Status: DC
  Administered 2019-04-28 – 2019-04-29 (×2): 40 mg via ORAL
  Filled 2019-04-28: qty 4
  Filled 2019-04-28: qty 2

## 2019-04-28 MED ORDER — METFORMIN HCL 500 MG PO TABS
1000.0000 mg | ORAL_TABLET | Freq: Two times a day (BID) | ORAL | Status: DC
  Administered 2019-04-29: 09:00:00 1000 mg via ORAL
  Filled 2019-04-28: qty 2

## 2019-04-28 MED ORDER — TRAZODONE HCL 50 MG PO TABS
50.0000 mg | ORAL_TABLET | Freq: Every evening | ORAL | Status: DC | PRN
  Filled 2019-04-28: qty 1

## 2019-04-28 MED ORDER — ATORVASTATIN CALCIUM 20 MG PO TABS: 80.0000 mg | ORAL_TABLET | Freq: Every day | ORAL | Status: DC

## 2019-04-28 NOTE — ED Notes (Signed)
SOC in progress awaiting report and plan of care.
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
## 2019-04-28 NOTE — ED Notes (Signed)
meds given for high b/p will re-assess.
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
## 2019-04-28 NOTE — ED Notes (Signed)
Call received from telepsych on call for report and patient current list of meds. Will be interviewing patient and sending report with plan of care.

## 2019-04-28 NOTE — ED Notes (Signed)
Blood glucose obtained 188, lunch box provided as per patient request, reports has not eaten anything all day.
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
## 2019-04-28 NOTE — ED Notes (Signed)
Patient sleeping will continue to monitor.  

## 2019-04-28 NOTE — ED Notes (Signed)
Per Mother please call either the home number of brother Eddies phone number.
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
## 2019-04-28 NOTE — ED Notes (Signed)
IVC/SOC called/ waiting on callback for consultation
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
## 2019-04-28 NOTE — ED Notes (Signed)
Pt dressed out by this RN and Angela,RN. Pt belonging placed in belongings bag and labeled with green stick and patient label. Pt had with him the following:  1 pair of blue jeans 1 white polo shirt 1 pair of white socks 1 pair of brown shoes 1 pair of grey boxers 1 silver metal ring with blue stone 1 gold metal ring with a brownish colored stone 1 black rope necklace with a blue stone  Pt was calm and cooperative during change out process.

## 2019-04-28 NOTE — ED Notes (Signed)
TTS consult in progress. °

## 2019-04-29 LAB — GLUCOSE, CAPILLARY
Glucose-Capillary: 188 mg/dL — ABNORMAL HIGH (ref 70–99)
Glucose-Capillary: 329 mg/dL — ABNORMAL HIGH (ref 70–99)

## 2019-04-29 LAB — SARS CORONAVIRUS 2 BY RT PCR (HOSPITAL ORDER, PERFORMED IN ~~LOC~~ HOSPITAL LAB): SARS Coronavirus 2: NEGATIVE

## 2019-04-29 MED ORDER — ALBUTEROL SULFATE (2.5 MG/3ML) 0.083% IN NEBU: 2.5000 mg | INHALATION_SOLUTION | Freq: Four times a day (QID) | RESPIRATORY_TRACT | Status: DC | PRN

## 2019-04-29 MED ORDER — ALBUTEROL SULFATE HFA 108 (90 BASE) MCG/ACT IN AERS
2.0000 | INHALATION_SPRAY | Freq: Four times a day (QID) | RESPIRATORY_TRACT | Status: DC | PRN
  Administered 2019-04-29: 2 via RESPIRATORY_TRACT
  Filled 2019-04-29: qty 6.7

## 2019-04-29 NOTE — ED Notes (Signed)
Pt oriented to [facility], pt in bathroom currently. Pt ambulates without difficulty.

## 2019-04-29 NOTE — ED Notes (Signed)
Pt's belongings taken to [facility] and placed in appropriate numbered locker. RN Radonna Ricker and EDT Linus Orn informed.

## 2019-04-29 NOTE — ED Notes (Signed)
Pt to bathroom

## 2019-04-29 NOTE — ED Notes (Signed)
Hourly rounding reveals patient in room. No complaints, stable, in no acute distress. Q15 minute rounds and monitoring via Security Cameras to continue. 

## 2019-04-29 NOTE — ED Notes (Signed)
Pt requesting shower, given new scrubs and supplies. Pt in shower now.

## 2019-04-29 NOTE — ED Notes (Signed)
Given lunch

## 2019-04-29 NOTE — ED Notes (Signed)
IVC/ [facility] completed/ Recommend Inpatient Admit/ Pending Disposition

## 2019-04-29 NOTE — ED Notes (Signed)
[facility]  DEPT  CALLED  FOR  TRANSFER

## 2019-04-29 NOTE — ED Notes (Signed)
Pt given cup of diet sprite.  

## 2019-04-29 NOTE — ED Notes (Signed)
Pt requesting his inhaler. Dr Alfred Levins informed and new order received.

## 2019-04-29 NOTE — ED Notes (Signed)
Pt out of shower and back in room. Supplies accounted for.

## 2019-04-29 NOTE — ED Notes (Signed)
Pt awake, up to bathroom and returned to bed.

## 2019-04-29 NOTE — ED Notes (Signed)
Pt given crackers, peanut butter and a vanilla ice cream.

## 2019-04-29 NOTE — ED Notes (Signed)
Pt given breakfast, eating at this time. Contracts for safety while in [place]. Verbalizes understanding of plan of care. Pt cooperative, appropriate.

## 2019-04-29 NOTE — ED Notes (Addendum)
Pt on phone, pt requesting passcode, [id number] given to patient.

## 2019-04-29 NOTE — ED Notes (Signed)
Pt in dayroom  

## 2019-04-29 NOTE — BH Assessment (Signed)
Patient has been accepted to [facility].  Patient assigned to [place] is Dr. Bjorn Loser.  Call report to [phone] / [phone].  Representative was [employer].   ER Staff is aware of it: Nitchia, ER Secretary  Dr. Charna Archer, ER MD  Ebony Hail, Patient's Nurse     Patient's Family/Support System [facility] [phone]) have been updated as well.

## 2019-04-29 NOTE — ED Notes (Signed)
Pt given diet sprite 

## 2019-04-29 NOTE — ED Notes (Signed)
Pt given breakfast tray and a diet sprite.  

## 2019-04-29 NOTE — ED Notes (Signed)
[place] Officer here to take patient to [facility]. Sent with belonging bag labeled 1 of 1.

## 2019-07-02 MED ORDER — HYDROCHLOROTHIAZIDE 25 MG TABLET
ORAL_TABLET | Freq: Every day | ORAL | 3 refills | 90 days | Status: CP
Start: 2019-07-02 — End: ?

## 2019-07-02 MED ORDER — METFORMIN 1,000 MG TABLET
ORAL_TABLET | Freq: Two times a day (BID) | ORAL | 3 refills | 90.00000 days | Status: CP
Start: 2019-07-02 — End: 2020-06-26

## 2019-07-02 MED ORDER — ATORVASTATIN 80 MG TABLET
ORAL_TABLET | Freq: Every day | ORAL | 3 refills | 90 days
Start: 2019-07-02 — End: ?

## 2019-07-02 MED ORDER — ARIPIPRAZOLE 15 MG TABLET
ORAL_TABLET | Freq: Every day | ORAL | 3 refills | 90.00000 days | Status: CP
Start: 2019-07-02 — End: ?

## 2019-07-02 MED ORDER — TRAZODONE 150 MG TABLET
ORAL_TABLET | Freq: Every day | ORAL | 0 refills | 90.00000 days | Status: CP
Start: 2019-07-02 — End: ?

## 2019-07-02 MED ORDER — AMLODIPINE 10 MG TABLET
ORAL_TABLET | Freq: Every day | ORAL | 3 refills | 90 days | Status: CP
Start: 2019-07-02 — End: 2020-07-01

## 2019-07-02 MED ORDER — LISINOPRIL 40 MG TABLET
ORAL_TABLET | Freq: Every day | ORAL | 3 refills | 360.00000 days | Status: CP
Start: 2019-07-02 — End: 2020-06-26

## 2019-07-09 ENCOUNTER — Ambulatory Visit: Admit: 2019-07-09 | Discharge: 2019-07-10

## 2019-07-31 ENCOUNTER — Encounter: Payer: Self-pay | Admitting: *Deleted

## 2019-07-31 ENCOUNTER — Other Ambulatory Visit: Payer: Self-pay

## 2019-07-31 ENCOUNTER — Emergency Department
Admission: EM | Admit: 2019-07-31 | Discharge: 2019-08-01 | Disposition: A | Discharge status: Home / Self Care | Payer: Medicare HMO | Attending: Emergency Medicine | Admitting: Emergency Medicine

## 2019-07-31 DIAGNOSIS — R45851 Suicidal ideations: Secondary | ICD-10-CM | POA: Insufficient documentation

## 2019-07-31 DIAGNOSIS — I10 Essential (primary) hypertension: Secondary | ICD-10-CM | POA: Diagnosis not present

## 2019-07-31 DIAGNOSIS — H547 Unspecified visual loss: Secondary | ICD-10-CM

## 2019-07-31 DIAGNOSIS — N179 Acute kidney failure, unspecified: Secondary | ICD-10-CM | POA: Diagnosis not present

## 2019-07-31 DIAGNOSIS — Z7984 Long term (current) use of oral hypoglycemic drugs: Secondary | ICD-10-CM | POA: Insufficient documentation

## 2019-07-31 DIAGNOSIS — J45909 Unspecified asthma, uncomplicated: Secondary | ICD-10-CM | POA: Diagnosis present

## 2019-07-31 DIAGNOSIS — E119 Type 2 diabetes mellitus without complications: Secondary | ICD-10-CM | POA: Diagnosis not present

## 2019-07-31 DIAGNOSIS — F251 Schizoaffective disorder, depressive type: Secondary | ICD-10-CM | POA: Diagnosis present

## 2019-07-31 DIAGNOSIS — F17200 Nicotine dependence, unspecified, uncomplicated: Secondary | ICD-10-CM | POA: Diagnosis present

## 2019-07-31 DIAGNOSIS — F209 Schizophrenia, unspecified: Secondary | ICD-10-CM | POA: Insufficient documentation

## 2019-07-31 DIAGNOSIS — Z008 Encounter for other general examination: Secondary | ICD-10-CM

## 2019-07-31 DIAGNOSIS — Z79899 Other long term (current) drug therapy: Secondary | ICD-10-CM | POA: Insufficient documentation

## 2019-07-31 DIAGNOSIS — E785 Hyperlipidemia, unspecified: Secondary | ICD-10-CM | POA: Diagnosis present

## 2019-07-31 DIAGNOSIS — I160 Hypertensive urgency: Secondary | ICD-10-CM | POA: Diagnosis present

## 2019-07-31 DIAGNOSIS — H902 Conductive hearing loss, unspecified: Secondary | ICD-10-CM | POA: Diagnosis present

## 2019-07-31 DIAGNOSIS — F250 Schizoaffective disorder, bipolar type: Secondary | ICD-10-CM | POA: Diagnosis present

## 2019-07-31 LAB — SALICYLATE LEVEL: Salicylate Lvl: <7 mg/dL — ABNORMAL LOW (ref 7.0–30.0)

## 2019-07-31 LAB — URINE DRUG SCREEN, QUALITATIVE (ARMC ONLY)
Amphetamines, Ur Screen: NOT DETECTED
Barbiturates, Ur Screen: NOT DETECTED
Benzodiazepine, Ur Scrn: NOT DETECTED
Cannabinoid 50 Ng, Ur ~~LOC~~: NOT DETECTED
Cocaine Metabolite,Ur ~~LOC~~: NOT DETECTED
MDMA (Ecstasy)Ur Screen: NOT DETECTED
Methadone Scn, Ur: NOT DETECTED
Opiate, Ur Screen: NOT DETECTED
Phencyclidine (PCP) Ur S: NOT DETECTED
Tricyclic, Ur Screen: NOT DETECTED

## 2019-07-31 LAB — CBC
HCT: 45 % (ref 39.0–52.0)
Hemoglobin: 15.2 g/dL (ref 13.0–17.0)
MCH: 28.4 pg (ref 26.0–34.0)
MCHC: 33.8 g/dL (ref 30.0–36.0)
MCV: 84.1 fL (ref 80.0–100.0)
Platelets: 290 10*3/uL (ref 150–400)
RBC: 5.35 MIL/uL (ref 4.22–5.81)
RDW: 12.7 % (ref 11.5–15.5)
WBC: 10.8 10*3/uL — ABNORMAL HIGH (ref 4.0–10.5)
nRBC: 0 % (ref 0.0–0.2)

## 2019-07-31 LAB — COMPREHENSIVE METABOLIC PANEL
ALT: 25 U/L (ref 0–44)
AST: 21 U/L (ref 15–41)
Albumin: 4.1 g/dL (ref 3.5–5.0)
Alkaline Phosphatase: 58 U/L (ref 38–126)
Anion gap: 10 (ref 5–15)
BUN: 21 mg/dL — ABNORMAL HIGH (ref 6–20)
CO2: 32 mmol/L (ref 22–32)
Calcium: 9.6 mg/dL (ref 8.9–10.3)
Chloride: 90 mmol/L — ABNORMAL LOW (ref 98–111)
Creatinine, Ser: 1.74 mg/dL — ABNORMAL HIGH (ref 0.61–1.24)
GFR calc Af Amer: 54 mL/min — ABNORMAL LOW (ref >60)
GFR calc non Af Amer: 46 mL/min — ABNORMAL LOW (ref >60)
Glucose, Bld: 298 mg/dL — ABNORMAL HIGH (ref 70–99)
Potassium: 5 mmol/L (ref 3.5–5.1)
Sodium: 132 mmol/L — ABNORMAL LOW (ref 135–145)
Total Bilirubin: 0.6 mg/dL (ref 0.3–1.2)
Total Protein: 7.3 g/dL (ref 6.5–8.1)

## 2019-07-31 LAB — ETHANOL: Alcohol, Ethyl (B): <10 mg/dL (ref <10)

## 2019-07-31 LAB — ACETAMINOPHEN LEVEL: Acetaminophen (Tylenol), Serum: <10 ug/mL — ABNORMAL LOW (ref 10–30)

## 2019-07-31 MED ORDER — SODIUM CHLORIDE 0.9 % IV BOLUS
1000.0000 mL | Freq: Once | INTRAVENOUS | Status: AC
  Administered 2019-07-31: 1000 mL via INTRAVENOUS
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
## 2019-07-31 NOTE — ED Notes (Signed)
3 rings, 1 necklace, pajama bottom and 1 tee shirt.

## 2019-07-31 NOTE — ED Provider Notes (Signed)
[facility] Emergency Department Provider Note  ____________________________________________   First MD Initiated Contact with Patient 07/31/19 2216     (approximate)  I have reviewed the triage vital signs and the nursing notes.   HISTORY  Chief Complaint Behavior Problem    HPI Henry Shaffer is a 44 y.o. male with schizophrenia, depression, diabetes who comes in under IVC.  He states he has been compliant with his medications.  According to IVC paperwork from his mother whom he lives with patient was threatening to kill himself and blow his brains out with a gun.  Patient is denying this happening.  Patient states that his only complaint is cramping in his legs that just started, constant, nothing makes better, nothing makes it worse as well as difficulty getting sexually aroused.  He denies any fevers, cough, shortness of breath or other concerns.  Patient states though he has not been eating and drinking well the past week or so.        Past Medical History:  Diagnosis Date  . Anxiety   . Depression   . Diabetes ([place])   . Hyperlipidemia   . Hypertension   . Schizophrenia [facility])     Patient Active Problem List   Diagnosis Date Noted  . Hypertensive urgency 11/07/2017  . Schizoaffective disorder, depressive type ([place]) 07/22/2017  . Asthma 02/14/2017  . HTN (hypertension) 02/13/2017  . Dyslipidemia 02/13/2017  . Diabetes ([place]) 02/13/2017  . Tobacco use disorder 02/13/2017  . Schizoaffective disorder, bipolar type ([place]) 06/29/2012  . Decreased vision 05/08/2012  . Conductive hearing loss, middle ear 11/02/2011    Past Surgical History:  Procedure Laterality Date  . CARDIAC CATHETERIZATION    . MIDDLE EAR SURGERY    . NASAL SINUS SURGERY      Prior to Admission medications   Medication Sig Start Date End Date Taking? Authorizing Provider  albuterol (PROVENTIL HFA;VENTOLIN HFA) 108 (90 Base) MCG/ACT inhaler Inhale 2 puffs into the  lungs every 6 (six) hours as needed for wheezing or shortness of breath.     [provider]  amLODipine (NORVASC) 10 MG tablet Take 1 tablet (10 mg total) by mouth daily. 07/09/18   Tennis Ship, MD  ARIPiprazole (ABILIFY) 10 MG tablet Take 10 mg by mouth daily. 02/19/19   [provider]  atorvastatin (LIPITOR) 80 MG tablet Take 1 tablet (80 mg total) by mouth daily at 6 PM. Patient not taking: Reported on 04/29/2019 07/09/18   Tennis Ship, MD  chlorthalidone (HYGROTON) 25 MG tablet Take 1 tablet (25 mg total) by mouth daily. Patient not taking: Reported on 04/29/2019 07/10/18   Tennis Ship, MD  cloNIDine (CATAPRES) 0.1 MG tablet Take 0.1 mg by mouth 2 (two) times daily. 03/06/19   [provider]  cloNIDine (CATAPRES) 0.2 MG tablet Take 1 tablet (0.2 mg total) by mouth 2 (two) times daily. At 8am and 5pm. Patient not taking: Reported on 04/29/2019 07/09/18   Tennis Ship, MD  divalproex (DEPAKOTE ER) 500 MG 24 hr tablet Take 2 tablets (1,000 mg total) by mouth daily. 07/10/18   Tennis Ship, MD  fluticasone (FLONASE) 50 MCG/ACT nasal spray Place 1 spray into both nostrils daily. 04/11/18   [provider]  fluvoxaMINE (LUVOX) 50 MG tablet Take 1 tablet (50 mg total) by mouth at bedtime. Patient not taking: Reported on 04/29/2019 07/09/18   Tennis Ship, MD  [facility] SUSTENNA 156 MG/ML SUSY injection Inject 1 mL (156 mg total) into the muscle  every 28 (twenty-eight) days. Next injection is due on 07/29/2018 Patient not taking: Reported on 04/29/2019 07/27/18   Hessie Knows, MD  linagliptin (TRADJENTA) 5 MG TABS tablet Take 1 tablet (5 mg total) by mouth daily. Patient not taking: Reported on 04/29/2019 07/10/18   Hessie Knows, MD  lisinopril (PRINIVIL,ZESTRIL) 40 MG tablet Take 1 tablet (40 mg total) by mouth daily. 07/09/18   Hessie Knows, MD  metFORMIN (GLUCOPHAGE) 1000 MG tablet Take 1 tablet (1,000 mg total) by mouth 2 (two) times daily with a meal.  (breakfast and dinner) 07/09/18   Hessie Knows, MD  paliperidone (INVEGA) 6 MG 24 hr tablet Take 6 mg by mouth daily.    [provider]  SPIRIVA HANDIHALER 18 MCG inhalation capsule Place 18 mcg into inhaler and inhale daily. 04/16/19   [provider]  traZODone (DESYREL) 50 MG tablet Take 1 tablet (50 mg total) by mouth at bedtime as needed for sleep. Patient not taking: Reported on 04/29/2019 07/09/18   Hessie Knows, MD    Allergies Codeine, Erythromycin, Sulfa antibiotics, and Sulfasalazine  Family History  Problem Relation Age of Onset  . Diabetes Mother   . CAD Mother   . Heart failure Father   . Diabetes Father   . Hypertension Father     Social History Social History   Tobacco Use  . Smoking status: Current Every Day Smoker    Packs/day: 2.00  . Smokeless tobacco: Never Used  Substance Use Topics  . Alcohol use: Yes    Alcohol/week: 1.0 standard drinks    Types: 1 Cans of beer per week    Comment: occassionally  . Drug use: Yes    Types: Marijuana    Comment: liquid marijuana 2 weeks ago      Review of Systems Constitutional: No fever/chills Eyes: No visual changes. ENT: No sore throat. Cardiovascular: Denies chest pain. Respiratory: Denies shortness of breath. Gastrointestinal: No abdominal pain.  No nausea, no vomiting.  No diarrhea.  No constipation. Not eating as well.  Genitourinary: Negative for dysuria. Musculoskeletal: Negative for back pain.  Leg cramping Skin: Negative for rash. Neurological: Negative for headaches, focal weakness or numbness.  All other ROS negative ____________________________________________   PHYSICAL EXAM:  VITAL SIGNS: ED Triage Vitals  Enc Vitals Group     BP 07/31/19 2019 (!) 171/108     Pulse Rate 07/31/19 2019 85     Resp 07/31/19 2019 18     Temp 07/31/19 2019 97.7 F (36.5 C)     Temp Source 07/31/19 2019 Oral     SpO2 07/31/19 2019 98 %     Weight 07/31/19 2016 240 lb (108.9 kg)      Height 07/31/19 2016 6' (1.829 m)     Head Circumference --      Peak Flow --      Pain Score 07/31/19 2016 0     Pain Loc --      Pain Edu? --      Excl. in GC? --     Constitutional: Alert and oriented. Well appearing and in no acute distress. Eyes: Conjunctivae are normal. EOMI. Head: Atraumatic. Nose: No congestion/rhinnorhea. Mouth/Throat: Mucous membranes are moist.   Neck: No stridor. Trachea Midline. FROM Cardiovascular: Normal rate, regular rhythm. Grossly normal heart sounds.  Good peripheral circulation. Respiratory: Normal respiratory effort.  No retractions. Lungs CTAB. Gastrointestinal: Soft and nontender. No distention. No abdominal bruits.  Musculoskeletal: No lower extremity tenderness nor edema.  No joint effusions. Neurologic:  Normal speech and language. No gross focal neurologic deficits are appreciated.  Skin:  Skin is warm, dry and intact. No rash noted. Psychiatric: Flat affect.  Denies SI or HI or auditory visualizations GU: Deferred   ____________________________________________   LABS (all labs ordered are listed, but only abnormal results are displayed)  Labs Reviewed  COMPREHENSIVE METABOLIC PANEL - Abnormal; Notable for the following components:      Result Value   Sodium 132 (*)    Chloride 90 (*)    Glucose, Bld 298 (*)    BUN 21 (*)    Creatinine, Ser 1.74 (*)    GFR calc non Af Amer 46 (*)    GFR calc Af Amer 54 (*)    All other components within normal limits  SALICYLATE LEVEL - Abnormal; Notable for the following components:   Salicylate Lvl <7.0 (*)    All other components within normal limits  ACETAMINOPHEN LEVEL - Abnormal; Notable for the following components:   Acetaminophen (Tylenol), Serum <10 (*)    All other components within normal limits  CBC - Abnormal; Notable for the following components:   WBC 10.8 (*)    All other components within normal limits  ETHANOL  URINE DRUG SCREEN, QUALITATIVE ([facility] ONLY)    ____________________________________________   ED E  INITIAL IMPRESSION / ASSESSMENT AND PLAN / ED COURSE  WRIGLEY PLASENCIA was evaluated in Emergency Department on 07/31/2019 for the symptoms described in the history of present illness. He was evaluated in the context of the global COVID-19 pandemic, which necessitated consideration that the patient might be at risk for infection with the SARS-CoV-2 virus that causes COVID-19. Institutional protocols and algorithms that pertain to the evaluation of patients at risk for COVID-19 are in a state of rapid change based on information released by regulatory bodies including the CDC and federal and state organizations. These policies and algorithms were followed during the patient's care in the ED.     Labs show creatinine of 1.7 up from his baseline of 1.15.  Patient's chloride and sodium are slightly low.  Given patient is not eating and drinking well will give 1 L of fluid. Will need f/u if pt d/c.   Pt is without any acute medical complaints. No exam findings to suggest medical cause of current presentation. Will order psychiatric screening labs and discuss further w/ psychiatric service.  D/d includes but is not limited to psychiatric disease, behavioral/personality disorder, inadequate socioeconomic support, medical.  Based on HPI, exam, unremarkable labs, no concern for acute medical problem at this time. No rigidity, clonus, hyperthermia, focal neurologic deficit, diaphoresis, tachycardia, meningismus, ataxia, gait abnormality or other finding to suggest this visit represents a non-psychiatric problem. Screening labs reviewed.    Given this, pt medically cleared, to be dispositioned per Psych.        ____________________________________________   FINAL CLINICAL IMPRESSION(S) / ED DIAGNOSES   Final diagnoses:  Evaluation by psychiatric service required  AKI (acute kidney injury) (HCC)      MEDICATIONS GIVEN DURING THIS VISIT:   Medications  sodium chloride 0.9 % bolus 1,000 mL (0 mLs Intravenous Stopped 07/31/19 2334)     ED Discharge Orders    None       Note:  This document was prepared using Dragon voice recognition software and may include unintentional dictation errors.   Concha Se, MD 08/01/19 [phone]

## 2019-07-31 NOTE — ED Triage Notes (Signed)
Pt brought in by [employer].   Pt is IVC.  Pt denies SI or HI.  Pt denies drug use or etoh.  Pt calm and cooperative.

## 2019-08-01 MED ORDER — CLONIDINE HCL 0.1 MG PO TABS
0.1000 mg | ORAL_TABLET | Freq: Two times a day (BID) | ORAL | Status: DC
  Administered 2019-08-01 (×2): 0.1 mg via ORAL
  Filled 2019-08-01 (×2): qty 1

## 2019-08-01 MED ORDER — AMLODIPINE BESYLATE 5 MG PO TABS
10.0000 mg | ORAL_TABLET | Freq: Every day | ORAL | Status: DC
  Administered 2019-08-01: 10 mg via ORAL
  Filled 2019-08-01: qty 2

## 2019-08-01 MED ORDER — LISINOPRIL 10 MG PO TABS
40.0000 mg | ORAL_TABLET | Freq: Every day | ORAL | Status: DC
  Administered 2019-08-01: 40 mg via ORAL
  Filled 2019-08-01: qty 4

## 2019-08-01 MED ORDER — HYDROCHLOROTHIAZIDE 25 MG PO TABS
25.0000 mg | ORAL_TABLET | Freq: Every day | ORAL | Status: DC
  Administered 2019-08-01: 25 mg via ORAL
  Filled 2019-08-01: qty 1

## 2019-08-01 MED ORDER — METFORMIN HCL 500 MG PO TABS
1000.0000 mg | ORAL_TABLET | Freq: Two times a day (BID) | ORAL | Status: DC
  Administered 2019-08-01: 1000 mg via ORAL
  Filled 2019-08-01: qty 2

## 2019-08-01 MED ORDER — ARIPIPRAZOLE 15 MG PO TABS
15.0000 mg | ORAL_TABLET | Freq: Every day | ORAL | Status: DC
  Administered 2019-08-01: 15 mg via ORAL
  Filled 2019-08-01: qty 3
  Filled 2019-08-01: qty 1

## 2019-08-01 MED ORDER — FLUTICASONE PROPIONATE 50 MCG/ACT NA SUSP
1.0000 | Freq: Every day | NASAL | Status: DC
  Administered 2019-08-01: 1 via NASAL
  Filled 2019-08-01: qty 16

## 2019-08-01 MED ORDER — TRAZODONE HCL 50 MG PO TABS: 150.0000 mg | ORAL_TABLET | Freq: Every day | ORAL | Status: DC

## 2019-08-01 MED ORDER — ALBUTEROL SULFATE HFA 108 (90 BASE) MCG/ACT IN AERS
2.0000 | INHALATION_SPRAY | Freq: Four times a day (QID) | RESPIRATORY_TRACT | Status: DC | PRN
  Filled 2019-08-01: qty 6.7

## 2019-08-01 MED ORDER — TRAZODONE HCL 50 MG PO TABS
150.0000 mg | ORAL_TABLET | Freq: Every day | ORAL | Status: DC
  Administered 2019-08-01: 150 mg via ORAL
  Filled 2019-08-01 (×2): qty 1

## 2019-08-01 NOTE — ED Notes (Addendum)
Patient provided with multiple blankets and food tray

## 2019-08-01 NOTE — Consult Note (Signed)
Patient reassessed this morning.  Patient calm and cooperative with Clinical research associate.  Patient able to clearly explain his presentation as a result of an argument and is now currently denying any suicidal ideation.  He does acknowledge a past history of psychotic symptoms but denies any psychosis today.  Patient is currently compliant with medication and has an upcoming outpatient appointment.  Collateral was attempted to be obtained however patient refused, and being an adult without any current symptoms he is within his right to do so.  IVC rescinded, patient to be discharged

## 2019-08-01 NOTE — Consult Note (Signed)
[facility] Face-to-Face Psychiatry Consult   Reason for Consult: Behavior problem Referring Physician: Dr. Fuller Plan Patient Identification: Henry Shaffer MRN:  [id number] Principal Diagnosis: <principal problem not specified> Diagnosis:  Active Problems:   HTN (hypertension)   Dyslipidemia   Diabetes (HCC)   Tobacco use disorder   Asthma   Conductive hearing loss, middle ear   Decreased vision   Schizoaffective disorder, bipolar type (HCC)   Schizoaffective disorder, depressive type (HCC)   Hypertensive urgency   Total Time spent with patient: 30 minutes  Subjective: "I do not know why I am here." Henry Shaffer is a 44 y.o. male patient Presented to [facility] ED via law enforcement under involuntary commitment status (IVC). Per the patient triage nursing note, he had threatened to harm himself by shooting himself with a gun.  The patient denies having access to a weapon.  When the patient arrived to triage, he denied SI and HI. During his assessment, the patient was calm and cooperative. The patient was able to report his name and where he was located, but when asked why he was presenting to the ED patient-stated "for physical treatment."  The patient states that he currently "live with my family." The patient reported that he was arrested "I was under house arrest" when asked why was he under house arrest for?  "I don't know." The patient reports that he currently lives with his mother, "I don't know if I trust her, though, because she allows my brother to intimidate me."   The patient was seen face-to-face by this provider; chart reviewed and consulted with Dr. Fuller Plan on 07/31/2019 due to the patient's care. It was discussed with the EDP that the patient would remain under observation overnight and reassess in the a.m to determine if he meets criteria for psychiatric inpatient admission or discharge back home.  The patient is alert and oriented x 2-3, calm, cooperative, and mood-congruent with affect  on evaluation.  The patient does not appear to be responding to internal or external stimuli. The patient is presenting with some delusional thinking. The patient denies auditory or visual hallucinations. The patient denies suicidal, homicidal, or self-harm ideations. The patient is not presenting with any psychotic or paranoid behaviors. During an encounter with the patient, he was unable to answer most of the questions appropriately. Per TTS counselor Ms. Andee Poles tried to obtain collateral information from the patient's mother Zada Girt [phone]), but there was no answer, and no voicemail was set up. Plan: The patient will be under observation overnight and reassess in the a.m. to determine if he meets criteria for psychiatric inpatient admission or could be discharged back home.  HPI: Per Dr. Fuller Plan; Henry Shaffer is a 44 y.o. male with schizophrenia, depression, diabetes who comes in under IVC.  He states he has been compliant with his medications.  According to IVC paperwork from his mother whom he lives with patient was threatening to kill himself and blow his brains out with a gun.  Patient is denying this happening.  Patient states that his only complaint is cramping in his legs that just started, constant, nothing makes better, nothing makes it worse as well as difficulty getting sexually aroused.  He denies any fevers, cough, shortness of breath or other concerns.  Patient states though he has not been eating and drinking well the past week or so.  Past Psychiatric History:  Anxiety Depression Schizophrenia (HCC)  Risk to Self: Suicidal Ideation: No Suicidal Intent: No Is patient at  risk for suicide?: No Suicidal Plan?: No Access to Means: No What has been your use of drugs/alcohol within the last 12 months?: Denies any substance use Triggers for Past Attempts: None known Intentional Self Injurious Behavior: None Risk to Others: Homicidal Ideation: No Thoughts of  Harm to Others: No Current Homicidal Intent: No Current Homicidal Plan: No Access to Homicidal Means: No History of harm to others?: No Assessment of Violence: None Noted Does patient have access to weapons?: No Criminal Charges Pending?: No Does patient have a court date: No Prior Inpatient Therapy: Prior Inpatient Therapy: Yes Prior Therapy Dates: 07/03/2018 Prior Therapy Facilty/Provider([facility]): [facility] Reason for Treatment: Schizoaffective Disorder Prior Outpatient Therapy: Prior Outpatient Therapy: (Unknown)  Past Medical History:  Past Medical History:  Diagnosis Date  . Anxiety   . Depression   . Diabetes (HCC)   . Hyperlipidemia   . Hypertension   . Schizophrenia [facility](HCC)     Past Surgical History:  Procedure Laterality Date  . CARDIAC CATHETERIZATION    . MIDDLE EAR SURGERY    . NASAL SINUS SURGERY     Family History:  Family History  Problem Relation Age of Onset  . Diabetes Mother   . CAD Mother   . Heart failure Father   . Diabetes Father   . Hypertension Father    Family Psychiatric  History:  Social History:  Social History   Substance and Sexual Activity  Alcohol Use Yes  . Alcohol/week: 1.0 standard drinks  . Types: 1 Cans of beer per week   Comment: occassionally     Social History   Substance and Sexual Activity  Drug Use Yes  . Types: Marijuana   Comment: liquid marijuana 2 weeks ago    Social History   Socioeconomic History  . Marital status: Single    Spouse name: Not on file  . Number of children: Not on file  . Years of education: Not on file  . Highest education level: Not on file  Occupational History  . Not on file  Tobacco Use  . Smoking status: Current Every Day Smoker    Packs/day: 2.00  . Smokeless tobacco: Never Used  Substance and Sexual Activity  . Alcohol use: Yes    Alcohol/week: 1.0 standard drinks    Types: 1 Cans of beer per week    Comment: occassionally  . Drug use: Yes    Types: Marijuana    Comment: liquid  marijuana 2 weeks ago  . Sexual activity: Never  Other Topics Concern  . Not on file  Social History Narrative   Independent at baseline   Social Determinants of Health   Financial Resource Strain:   . Difficulty of Paying Living Expenses: Not on file  Food Insecurity:   . Worried About Programme researcher, broadcasting/film/videounning Out of Food in the Last Year: Not on file  . Ran Out of Food in the Last Year: Not on file  Transportation Needs:   . Lack of Transportation (Medical): Not on file  . Lack of Transportation (Non-Medical): Not on file  Physical Activity:   . Days of Exercise per Week: Not on file  . Minutes of Exercise per Session: Not on file  Stress:   . Feeling of Stress : Not on file  Social Connections:   . Frequency of Communication with Friends and Family: Not on file  . Frequency of Social Gatherings with Friends and Family: Not on file  . Attends Religious Services: Not on file  . Active Member of  Clubs or Organizations: Not on file  . Attends Banker Meetings: Not on file  . Marital Status: Not on file   Additional Social History:    Allergies:   Allergies  Allergen Reactions  . Codeine Other (See Comments)    Unknown. Pt's father reported this in the past, but pt doesn't know his reaction.  . Erythromycin Other (See Comments)    Unknown. Pt's father reported this in the past, but pt doesn't know his reaction.  . Sulfa Antibiotics Other (See Comments)    Unknown. Pt's father reported this in the past, but pt doesn't know his reaction.  . Sulfasalazine Other (See Comments)    Unknown. Pt's father reported this in the past, but pt doesn't know his reaction.    Labs:  Results for orders placed or performed during the hospital encounter of 07/31/19 (from the past 48 hour(s))  Urine Drug Screen, Qualitative     Status: None   Collection Time: 07/31/19  8:18 PM  Result Value Ref Range   Tricyclic, Ur Screen NONE DETECTED NONE DETECTED   Amphetamines, Ur Screen NONE DETECTED  NONE DETECTED   MDMA (Ecstasy)Ur Screen NONE DETECTED NONE DETECTED   Cocaine Metabolite,Ur [place] NONE DETECTED NONE DETECTED   Opiate, Ur Screen NONE DETECTED NONE DETECTED   Phencyclidine (PCP) Ur S NONE DETECTED NONE DETECTED   Cannabinoid 50 Ng, Ur [place] NONE DETECTED NONE DETECTED   Barbiturates, Ur Screen NONE DETECTED NONE DETECTED   Benzodiazepine, Ur Scrn NONE DETECTED NONE DETECTED   Methadone Scn, Ur NONE DETECTED NONE DETECTED    Comment: (NOTE) Tricyclics + metabolites, urine    Cutoff 1000 ng/mL Amphetamines + metabolites, urine  Cutoff 1000 ng/mL MDMA (Ecstasy), urine              Cutoff 500 ng/mL Cocaine Metabolite, urine          Cutoff 300 ng/mL Opiate + metabolites, urine        Cutoff 300 ng/mL Phencyclidine (PCP), urine         Cutoff 25 ng/mL Cannabinoid, urine                 Cutoff 50 ng/mL Barbiturates + metabolites, urine  Cutoff 200 ng/mL Benzodiazepine, urine              Cutoff 200 ng/mL Methadone, urine                   Cutoff 300 ng/mL The urine drug screen provides only a preliminary, unconfirmed analytical test result and should not be used for non-medical purposes. Clinical consideration and professional judgment should be applied to any positive drug screen result due to possible interfering substances. A more specific alternate chemical method must be used in order to obtain a confirmed analytical result. Gas chromatography / mass spectrometry (GC/MS) is the preferred confirmat ory method. Performed at [facility], [address] [place], [place] [address]   Comprehensive metabolic panel     Status: Abnormal   Collection Time: 07/31/19  8:21 PM  Result Value Ref Range   Sodium 132 (L) 135 - 145 mmol/L   Potassium 5.0 3.5 - 5.1 mmol/L   Chloride 90 (L) 98 - 111 mmol/L   CO2 32 22 - 32 mmol/L   Glucose, Bld 298 (H) 70 - 99 mg/dL   BUN 21 (H) 6 - 20 mg/dL   Creatinine, Ser 5.79 (H) 0.61 - 1.24 mg/dL   Calcium 9.6 8.9 - 10.3  mg/dL    Total Protein 7.3 6.5 - 8.1 g/dL   Albumin 4.1 3.5 - 5.0 g/dL   AST 21 15 - 41 U/L   ALT 25 0 - 44 U/L   Alkaline Phosphatase 58 38 - 126 U/L   Total Bilirubin 0.6 0.3 - 1.2 mg/dL   GFR calc non Af Amer 46 (L) >60 mL/min   GFR calc Af Amer 54 (L) >60 mL/min   Anion gap 10 5 - 15    Comment: Performed at [facility], [address] [place], [place] [address]  Ethanol     Status: None   Collection Time: 07/31/19  8:21 PM  Result Value Ref Range   Alcohol, Ethyl (B) <10 <10 mg/dL    Comment: (NOTE) Lowest detectable limit for serum alcohol is 10 mg/dL. For medical purposes only. Performed at [facility], [address] [place], [place] [address]   Salicylate level     Status: Abnormal   Collection Time: 07/31/19  8:21 PM  Result Value Ref Range   Salicylate Lvl <7.0 (L) 7.0 - 30.0 mg/dL    Comment: Performed at [facility], [address] [place], [place] [address]  Acetaminophen level     Status: Abnormal   Collection Time: 07/31/19  8:21 PM  Result Value Ref Range   Acetaminophen (Tylenol), Serum <10 (L) 10 - 30 ug/mL    Comment: (NOTE) Therapeutic concentrations vary significantly. A range of 10-30 ug/mL  may be an effective concentration for many patients. However, some  are best treated at concentrations outside of this range. Acetaminophen concentrations >150 ug/mL at 4 hours after ingestion  and >50 ug/mL at 12 hours after ingestion are often associated with  toxic reactions. Performed at [facility], [address] [place], [place] [address]   cbc     Status: Abnormal   Collection Time: 07/31/19  8:21 PM  Result Value Ref Range   WBC 10.8 (H) 4.0 - 10.5 K/uL   RBC 5.35 4.22 - 5.81 MIL/uL   Hemoglobin 15.2 13.0 - 17.0 g/dL   HCT 53.6 14.4 - 31.5 %   MCV 84.1 80.0 - 100.0 fL   MCH 28.4 26.0 - 34.0 pg   MCHC 33.8 30.0 - 36.0 g/dL   RDW 40.0 86.7 - 61.9 %   Platelets 290 150 - 400 K/uL   nRBC 0.0 0.0 - 0.2 %     Comment: Performed at [facility], [address], [place], [place] [address]    Current Facility-Administered Medications  Medication Dose Route Frequency Provider Last Rate Last Admin  . albuterol (VENTOLIN HFA) 108 (90 Base) MCG/ACT inhaler 2 puff  2 puff Inhalation Q6H PRN Don Perking, [place], MD      . amLODipine (NORVASC) tablet 10 mg  10 mg Oral Daily Don Perking, [place], MD      . ARIPiprazole (ABILIFY) tablet 15 mg  15 mg Oral Daily Don Perking, [place], MD      . cloNIDine (CATAPRES) tablet 0.1 mg  0.1 mg Oral BID Don Perking, [place], MD   0.1 mg at 08/01/19 0142  . fluticasone (FLONASE) 50 MCG/ACT nasal spray 1 spray  1 spray Each Nare Daily Don Perking, [place], MD      . hydrochlorothiazide (HYDRODIURIL) tablet 25 mg  25 mg Oral Daily Don Perking, [place], MD      . lisinopril (ZESTRIL) tablet 40 mg  40 mg Oral Daily Don Perking, [place], MD      . metFORMIN (GLUCOPHAGE) tablet 1,000 mg  1,000 mg Oral BID WC Alfred Levins, [place], MD      . traZODone (DESYREL) tablet 150 mg  150 mg Oral Daily Hart Robinsons A, RPH   150 mg at 08/01/19 0142   Current Outpatient Medications  Medication Sig Dispense Refill  . albuterol (PROVENTIL HFA;VENTOLIN HFA) 108 (90 Base) MCG/ACT inhaler Inhale 2 puffs into the lungs every 6 (six) hours as needed for wheezing or shortness of breath.     Marland Kitchen amLODipine (NORVASC) 10 MG tablet Take 1 tablet (10 mg total) by mouth daily. 30 tablet 1  . ARIPiprazole (ABILIFY) 15 MG tablet Take 15 mg by mouth daily.    . cloNIDine (CATAPRES) 0.1 MG tablet Take 0.1 mg by mouth 2 (two) times daily.    . fluticasone (FLONASE) 50 MCG/ACT nasal spray Place 1 spray into both nostrils daily.  5  . hydrochlorothiazide (HYDRODIURIL) 25 MG tablet Take 25 mg by mouth daily.    Marland Kitchen lisinopril (PRINIVIL,ZESTRIL) 40 MG tablet Take 1 tablet (40 mg total) by mouth daily. 30 tablet 1  . metFORMIN (GLUCOPHAGE) 1000 MG tablet Take 1 tablet (1,000 mg total) by mouth 2 (two) times daily with a  meal. (breakfast and dinner) 60 tablet 0  . traZODone (DESYREL) 150 MG tablet Take 150 mg by mouth daily.    Marland Kitchen atorvastatin (LIPITOR) 80 MG tablet Take 1 tablet (80 mg total) by mouth daily at 6 PM. (Patient not taking: Reported on 04/29/2019) 30 tablet 1  . divalproex (DEPAKOTE ER) 500 MG 24 hr tablet Take 2 tablets (1,000 mg total) by mouth daily. (Patient not taking: Reported on 08/01/2019) 60 tablet 1  . INVEGA SUSTENNA 156 MG/ML SUSY injection Inject 1 mL (156 mg total) into the muscle every 28 (twenty-eight) days. Next injection is due on 07/29/2018 (Patient not taking: Reported on 04/29/2019) 1.2 mL 1  . SPIRIVA HANDIHALER 18 MCG inhalation capsule Place 18 mcg into inhaler and inhale daily.      Musculoskeletal: Strength & Muscle Tone: within normal limits Gait & Station: normal Patient leans: N/A  Psychiatric Specialty Exam: Physical Exam  Nursing note and vitals reviewed. Constitutional: He appears well-developed and well-nourished.  Respiratory: Effort normal.  Musculoskeletal:        General: Normal range of motion.     Cervical back: Normal range of motion and neck supple.    Review of Systems  Psychiatric/Behavioral: Positive for sleep disturbance. The patient is nervous/anxious.   All other systems reviewed and are negative.   Blood pressure (!) 176/109, pulse 85, temperature (!) 97.4 F (36.3 C), temperature source Oral, resp. rate 18, height 6' (1.829 m), weight 108.9 kg, SpO2 98 %.Body mass index is 32.55 kg/m.  General Appearance: Disheveled  Eye Contact:  Minimal  Speech:  Garbled and Slow  Volume:  Decreased  Mood:  Anxious and Depressed  Affect:  Congruent, Depressed and Inappropriate  Thought Process:  Coherent  Orientation:  Full (Time, Place, and Person)  Thought Content:  Logical, Obsessions and Rumination  Suicidal Thoughts:  No  Homicidal Thoughts:  No  Memory:  Immediate;   Fair Recent;   Fair Remote;   Fair  Judgement:  Impaired  Insight:  Lacking   Psychomotor Activity:  Increased  Concentration:  Concentration: Fair and Attention Span: Fair  Recall:  Poor  Fund of Knowledge:  Poor  Language:  Poor  Akathisia:  Negative  Handed:  Right  AIMS (if indicated):     Assets:  Communication Skills Desire for Improvement Social  Support  ADL's:  Intact  Cognition:  Impaired,  Mild  Sleep:        Treatment Plan Summary: Daily contact with patient to assess and evaluate symptoms and progress in treatment and Plan The patient will remain under observation overnight and reassess in the a.m. to determine if he meets criteria for psychiatric inpatient or can be discharged back home.  Disposition: Supportive therapy provided about ongoing stressors. The patient will remain under observation overnight and reassess in the a.m. to determine if he meets criteria for psychiatric inpatient admission or be discharged back home  Gillermo MurdochJacqueline Jadamarie Butson, NP 08/01/2019 3:42 AM

## 2019-08-01 NOTE — ED Notes (Signed)
Pt contacted friend to transport him home, pt given belongings bag back.

## 2019-08-01 NOTE — ED Notes (Signed)
Patient resting, NAD. Will recheck vitals at 0600 and give patient Abilify dose

## 2019-08-01 NOTE — ED Notes (Signed)
Patient came into hallway and expressed concerns about being "taken advantage of" and states he needs "sympathy and empathy". Encouraged patient to return to bed, turned off TV so patient can get rest

## 2019-08-01 NOTE — ED Provider Notes (Signed)
Patient seen and cleared by psychiatry. Labs show mild AKI for which he has been hydrating here. Will have him f/u with PCP in the next week for re-check.   [name], MD 08/01/19 1058

## 2019-08-01 NOTE — ED Notes (Signed)
Patient sleeping at present.  

## 2019-08-01 NOTE — ED Notes (Signed)
Patient was becoming slightly more aggressive, expressing frustration about "lack of sex drive" and wanting to call his mother. Explained to patient that he needed to stay in room, provided emotional support to patient. Home meds ordered for patient, trazodone to be given
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
## 2019-08-01 NOTE — ED Notes (Signed)
Patient is resting comfortably. 

## 2019-08-01 NOTE — ED Notes (Signed)
Patient is asking about breakfast, told patient that meal tray will be brought to ED after shift change. Patient asking again about calling his mother, provided phone but told patient that his mother was probably asleep

## 2019-08-01 NOTE — Discharge Instructions (Addendum)
Your kidney function was elevated. This will need to be recheck in the next 5-7 days. Stay hydrated.

## 2019-08-01 NOTE — ED Notes (Signed)
Pharmacy called to reconcile home meds

## 2019-08-07 ENCOUNTER — Telehealth: Payer: Self-pay
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
## 2019-08-08 ENCOUNTER — Ambulatory Visit: Admit: 2019-08-08 | Discharge: 2019-08-09 | Payer: MEDICARE

## 2019-08-24 ENCOUNTER — Emergency Department: Payer: Medicare HMO

## 2019-08-24 ENCOUNTER — Other Ambulatory Visit: Payer: Self-pay

## 2019-08-24 ENCOUNTER — Emergency Department
Admission: EM | Admit: 2019-08-24 | Discharge: 2019-08-24 | Disposition: A | Discharge status: Home / Self Care | Payer: Medicare HMO | Attending: Emergency Medicine | Admitting: Emergency Medicine

## 2019-08-24 ENCOUNTER — Encounter: Payer: Self-pay | Admitting: Emergency Medicine

## 2019-08-24 DIAGNOSIS — Z7984 Long term (current) use of oral hypoglycemic drugs: Secondary | ICD-10-CM | POA: Diagnosis not present

## 2019-08-24 DIAGNOSIS — Z79899 Other long term (current) drug therapy: Secondary | ICD-10-CM | POA: Insufficient documentation

## 2019-08-24 DIAGNOSIS — S2242XA Multiple fractures of ribs, left side, initial encounter for closed fracture: Secondary | ICD-10-CM | POA: Insufficient documentation

## 2019-08-24 DIAGNOSIS — M25561 Pain in right knee: Secondary | ICD-10-CM | POA: Insufficient documentation

## 2019-08-24 DIAGNOSIS — Y998 Other external cause status: Secondary | ICD-10-CM | POA: Diagnosis not present

## 2019-08-24 DIAGNOSIS — Z20822 Contact with and (suspected) exposure to covid-19: Secondary | ICD-10-CM | POA: Diagnosis not present

## 2019-08-24 DIAGNOSIS — E119 Type 2 diabetes mellitus without complications: Secondary | ICD-10-CM | POA: Insufficient documentation

## 2019-08-24 DIAGNOSIS — Y9389 Activity, other specified: Secondary | ICD-10-CM | POA: Insufficient documentation

## 2019-08-24 DIAGNOSIS — S0003XA Contusion of scalp, initial encounter: Secondary | ICD-10-CM | POA: Diagnosis not present

## 2019-08-24 DIAGNOSIS — F17210 Nicotine dependence, cigarettes, uncomplicated: Secondary | ICD-10-CM | POA: Diagnosis not present

## 2019-08-24 DIAGNOSIS — F1210 Cannabis abuse, uncomplicated: Secondary | ICD-10-CM | POA: Insufficient documentation

## 2019-08-24 DIAGNOSIS — I10 Essential (primary) hypertension: Secondary | ICD-10-CM | POA: Diagnosis not present

## 2019-08-24 DIAGNOSIS — S299XXA Unspecified injury of thorax, initial encounter: Secondary | ICD-10-CM | POA: Diagnosis present

## 2019-08-24 DIAGNOSIS — Y929 Unspecified place or not applicable: Secondary | ICD-10-CM | POA: Diagnosis not present

## 2019-08-24 LAB — CBC WITH DIFFERENTIAL/PLATELET
Abs Immature Granulocytes: 0.06 10*3/uL (ref 0.00–0.07)
Basophils Absolute: 0.1 10*3/uL (ref 0.0–0.1)
Basophils Relative: 0 %
Eosinophils Absolute: 0.1 10*3/uL (ref 0.0–0.5)
Eosinophils Relative: 1 %
HCT: 43 % (ref 39.0–52.0)
Hemoglobin: 14.4 g/dL (ref 13.0–17.0)
Immature Granulocytes: 1 %
Lymphocytes Relative: 15 %
Lymphs Abs: 1.8 10*3/uL (ref 0.7–4.0)
MCH: 28.4 pg (ref 26.0–34.0)
MCHC: 33.5 g/dL (ref 30.0–36.0)
MCV: 84.8 fL (ref 80.0–100.0)
Monocytes Absolute: 1 10*3/uL (ref 0.1–1.0)
Monocytes Relative: 9 %
Neutro Abs: 8.9 10*3/uL — ABNORMAL HIGH (ref 1.7–7.7)
Neutrophils Relative %: 74 %
Platelets: 274 10*3/uL (ref 150–400)
RBC: 5.07 MIL/uL (ref 4.22–5.81)
RDW: 13.3 % (ref 11.5–15.5)
WBC: 11.9 10*3/uL — ABNORMAL HIGH (ref 4.0–10.5)
nRBC: 0 % (ref 0.0–0.2)

## 2019-08-24 LAB — COMPREHENSIVE METABOLIC PANEL
ALT: 26 U/L (ref 0–44)
AST: 25 U/L (ref 15–41)
Albumin: 4 g/dL (ref 3.5–5.0)
Alkaline Phosphatase: 54 U/L (ref 38–126)
Anion gap: 11 (ref 5–15)
BUN: 12 mg/dL (ref 6–20)
CO2: 26 mmol/L (ref 22–32)
Calcium: 8.8 mg/dL — ABNORMAL LOW (ref 8.9–10.3)
Chloride: 97 mmol/L — ABNORMAL LOW (ref 98–111)
Creatinine, Ser: 1.39 mg/dL — ABNORMAL HIGH (ref 0.61–1.24)
GFR calc Af Amer: >60 mL/min (ref >60)
GFR calc non Af Amer: >60 mL/min (ref >60)
Glucose, Bld: 245 mg/dL — ABNORMAL HIGH (ref 70–99)
Potassium: 3.8 mmol/L (ref 3.5–5.1)
Sodium: 134 mmol/L — ABNORMAL LOW (ref 135–145)
Total Bilirubin: 0.9 mg/dL (ref 0.3–1.2)
Total Protein: 7 g/dL (ref 6.5–8.1)

## 2019-08-24 LAB — POC SARS CORONAVIRUS 2 AG: SARS Coronavirus 2 Ag: NEGATIVE

## 2019-08-24 MED ORDER — HYDROCODONE-ACETAMINOPHEN 5-325 MG PO TABS: 1.0000 | ORAL_TABLET | Freq: Four times a day (QID) | ORAL | 0 refills | Status: DC | PRN

## 2019-08-24 MED ORDER — OXYCODONE-ACETAMINOPHEN 5-325 MG PO TABS
1.0000 | ORAL_TABLET | Freq: Once | ORAL | Status: AC
  Administered 2019-08-24: 1 via ORAL
  Filled 2019-08-24: qty 1

## 2019-08-24 MED ORDER — ALBUTEROL SULFATE HFA 108 (90 BASE) MCG/ACT IN AERS
2.0000 | INHALATION_SPRAY | Freq: Once | RESPIRATORY_TRACT | Status: AC
  Administered 2019-08-24: 2 via RESPIRATORY_TRACT
  Filled 2019-08-24: qty 6.7
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
## 2019-08-24 NOTE — ED Notes (Signed)
Pt transported to CT ?

## 2019-08-28 ENCOUNTER — Encounter: Admit: 2019-08-28 | Discharge: 2019-08-29 | Disposition: A | Discharge status: Home / Self Care | Payer: MEDICARE

## 2019-08-29 ENCOUNTER — Emergency Department: Payer: Medicare HMO

## 2019-08-29 ENCOUNTER — Emergency Department
Admission: EM | Admit: 2019-08-29 | Discharge: 2019-08-30 | Disposition: A | Discharge status: Home / Self Care | Payer: Medicare HMO | Attending: Emergency Medicine | Admitting: Emergency Medicine

## 2019-08-29 ENCOUNTER — Encounter: Payer: Self-pay | Admitting: Emergency Medicine

## 2019-08-29 DIAGNOSIS — E119 Type 2 diabetes mellitus without complications: Secondary | ICD-10-CM | POA: Diagnosis not present

## 2019-08-29 DIAGNOSIS — Z79899 Other long term (current) drug therapy: Secondary | ICD-10-CM | POA: Insufficient documentation

## 2019-08-29 DIAGNOSIS — F17210 Nicotine dependence, cigarettes, uncomplicated: Secondary | ICD-10-CM | POA: Insufficient documentation

## 2019-08-29 DIAGNOSIS — Z789 Other specified health status: Secondary | ICD-10-CM

## 2019-08-29 DIAGNOSIS — F259 Schizoaffective disorder, unspecified: Secondary | ICD-10-CM | POA: Diagnosis present

## 2019-08-29 DIAGNOSIS — Z7984 Long term (current) use of oral hypoglycemic drugs: Secondary | ICD-10-CM | POA: Diagnosis not present

## 2019-08-29 DIAGNOSIS — I10 Essential (primary) hypertension: Secondary | ICD-10-CM | POA: Insufficient documentation

## 2019-08-29 HISTORY — DX: Unspecified mental disorder due to known physiological condition: F09

## 2019-08-29 LAB — SALICYLATE LEVEL: Salicylate Lvl: <7 mg/dL — ABNORMAL LOW (ref 7.0–30.0)

## 2019-08-29 LAB — COMPREHENSIVE METABOLIC PANEL WITH GFR
ALT: 22 U/L (ref 0–44)
AST: 16 U/L (ref 15–41)
Albumin: 4 g/dL (ref 3.5–5.0)
Alkaline Phosphatase: 62 U/L (ref 38–126)
Anion gap: 12 (ref 5–15)
BUN: 18 mg/dL (ref 6–20)
CO2: 30 mmol/L (ref 22–32)
Calcium: 9.5 mg/dL (ref 8.9–10.3)
Chloride: 89 mmol/L — ABNORMAL LOW (ref 98–111)
Creatinine, Ser: 1.34 mg/dL — ABNORMAL HIGH (ref 0.61–1.24)
GFR calc Af Amer: >60 mL/min
GFR calc non Af Amer: >60 mL/min
Glucose, Bld: 313 mg/dL — ABNORMAL HIGH (ref 70–99)
Potassium: 3.5 mmol/L (ref 3.5–5.1)
Sodium: 131 mmol/L — ABNORMAL LOW (ref 135–145)
Total Bilirubin: 0.7 mg/dL (ref 0.3–1.2)
Total Protein: 7 g/dL (ref 6.5–8.1)

## 2019-08-29 LAB — CBC
HCT: 43.2 % (ref 39.0–52.0)
Hemoglobin: 15 g/dL (ref 13.0–17.0)
MCH: 29.3 pg (ref 26.0–34.0)
MCHC: 34.7 g/dL (ref 30.0–36.0)
MCV: 84.4 fL (ref 80.0–100.0)
Platelets: 309 10*3/uL (ref 150–400)
RBC: 5.12 MIL/uL (ref 4.22–5.81)
RDW: 12.9 % (ref 11.5–15.5)
WBC: 11.8 10*3/uL — ABNORMAL HIGH (ref 4.0–10.5)
nRBC: 0 % (ref 0.0–0.2)

## 2019-08-29 LAB — URINE DRUG SCREEN, QUALITATIVE (ARMC ONLY)
Amphetamines, Ur Screen: NOT DETECTED
Barbiturates, Ur Screen: NOT DETECTED
Benzodiazepine, Ur Scrn: NOT DETECTED
Cannabinoid 50 Ng, Ur ~~LOC~~: NOT DETECTED
Cocaine Metabolite,Ur ~~LOC~~: NOT DETECTED
MDMA (Ecstasy)Ur Screen: NOT DETECTED
Methadone Scn, Ur: NOT DETECTED
Opiate, Ur Screen: NOT DETECTED
Phencyclidine (PCP) Ur S: NOT DETECTED
Tricyclic, Ur Screen: NOT DETECTED

## 2019-08-29 LAB — ACETAMINOPHEN LEVEL: Acetaminophen (Tylenol), Serum: <10 ug/mL — ABNORMAL LOW (ref 10–30)

## 2019-08-29 LAB — ETHANOL: Alcohol, Ethyl (B): <10 mg/dL

## 2019-08-29 MED ORDER — CLONIDINE HCL 0.1 MG PO TABS
0.10 | ORAL_TABLET | ORAL | Status: DC
Start: 2019-08-29 — End: 2019-08-29

## 2019-08-29 MED ORDER — ALBUTEROL SULFATE HFA 108 (90 BASE) MCG/ACT IN AERS
2.00 | INHALATION_SPRAY | RESPIRATORY_TRACT | Status: DC
Start: ? — End: 2019-08-29

## 2019-08-29 MED ORDER — TRAZODONE HCL 150 MG PO TABS
150.00 | ORAL_TABLET | ORAL | Status: DC
Start: 2019-08-29 — End: 2019-08-29

## 2019-08-29 MED ORDER — METFORMIN HCL 500 MG PO TABS
1000.00 | ORAL_TABLET | ORAL | Status: DC
Start: 2019-08-29 — End: 2019-08-29

## 2019-08-29 MED ORDER — ARIPIPRAZOLE 15 MG PO TABS
15.00 | ORAL_TABLET | ORAL | Status: DC
Start: 2019-08-30 — End: 2019-08-29

## 2019-08-29 MED ORDER — DIPHENHYDRAMINE HCL 25 MG PO CAPS
25.00 | ORAL_CAPSULE | ORAL | Status: DC
Start: ? — End: 2019-08-29

## 2019-08-29 MED ORDER — NICOTINE 21 MG/24HR TD PT24
1.00 | MEDICATED_PATCH | TRANSDERMAL | Status: DC
Start: ? — End: 2019-08-29

## 2019-08-29 MED ORDER — LIDOCAINE 5 % EX PTCH
1.00 | MEDICATED_PATCH | CUTANEOUS | Status: DC
Start: ? — End: 2019-08-29

## 2019-08-29 MED ORDER — LISINOPRIL 40 MG PO TABS
40.00 | ORAL_TABLET | ORAL | Status: DC
Start: 2019-08-30 — End: 2019-08-29

## 2019-08-29 MED ORDER — NICOTINE POLACRILEX 2 MG MT GUM
2.00 | CHEWING_GUM | OROMUCOSAL | Status: DC
Start: ? — End: 2019-08-29

## 2019-08-29 MED ORDER — HYDROCHLOROTHIAZIDE 25 MG PO TABS
25.00 | ORAL_TABLET | ORAL | Status: DC
Start: 2019-08-30 — End: 2019-08-29

## 2019-08-29 MED ORDER — AMLODIPINE BESYLATE 10 MG PO TABS
10.00 | ORAL_TABLET | ORAL | Status: DC
Start: 2019-08-30 — End: 2019-08-29

## 2019-08-29 MED ORDER — DSS 100 MG PO CAPS
100.00 | ORAL_CAPSULE | ORAL | Status: DC
Start: 2019-08-30 — End: 2019-08-29

## 2019-08-29 MED ORDER — OXYCODONE HCL 5 MG PO TABS
5.00 | ORAL_TABLET | ORAL | Status: DC
Start: ? — End: 2019-08-29

## 2019-08-29 MED ORDER — MELATONIN 3 MG PO TABS
3.00 | ORAL_TABLET | ORAL | Status: DC
Start: ? — End: 2019-08-29

## 2019-08-29 MED ORDER — ACETAMINOPHEN 325 MG PO TABS
650.00 | ORAL_TABLET | ORAL | Status: DC
Start: ? — End: 2019-08-29

## 2019-08-29 MED ORDER — DIVALPROEX SODIUM ER 500 MG PO TB24
1000.00 | ORAL_TABLET | ORAL | Status: DC
Start: 2019-08-30 — End: 2019-08-29

## 2019-08-29 MED ORDER — ATORVASTATIN CALCIUM 40 MG PO TABS
80.00 | ORAL_TABLET | ORAL | Status: DC
Start: 2019-08-30 — End: 2019-08-29

## 2019-08-29 MED ORDER — TRAZODONE 150 MG TABLET
ORAL_TABLET | Freq: Every evening | ORAL | 0 refills | 7.00000 days | Status: CP
Start: 2019-08-29 — End: 2019-09-05
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
## 2019-08-29 NOTE — ED Notes (Addendum)
Personal belongings placed in a pt bag. Blue sweater, blue jeans, blue pair of socks, grey boxer, camo crocs and silver ring. Provided pt w purple scrubs.

## 2019-08-29 NOTE — ED Notes (Signed)
This tech sitting with pt at this time as a 1:1 Recruitment consultant. Pt asleep in recliner chair.

## 2019-08-29 NOTE — ED Notes (Signed)
Beth, EDT sitting with pt now. Pt continues sleeping in recliner chair at this time.

## 2019-08-30 ENCOUNTER — Encounter: Payer: Self-pay | Admitting: Emergency Medicine

## 2019-08-30 MED ORDER — HYDROCODONE-ACETAMINOPHEN 5-325 MG PO TABS
1.0000 | ORAL_TABLET | Freq: Once | ORAL | Status: AC
  Administered 2019-08-30: 1 via ORAL
  Filled 2019-08-30: qty 1

## 2019-08-30 MED ORDER — ACETAMINOPHEN 500 MG PO TABS
1000.0000 mg | ORAL_TABLET | Freq: Once | ORAL | Status: AC
  Administered 2019-08-30: 1000 mg via ORAL
  Filled 2019-08-30: qty 2

## 2019-08-30 MED ORDER — LIDOCAINE 5 % EX PTCH
1.0000 | MEDICATED_PATCH | CUTANEOUS | Status: DC
  Administered 2019-08-30: 1 via TRANSDERMAL
  Filled 2019-08-30: qty 1

## 2019-08-30 MED ORDER — ARIPIPRAZOLE 15 MG TABLET
ORAL_TABLET | Freq: Every day | ORAL | 0 refills | 7 days | Status: CP
Start: 2019-08-30 — End: 2019-09-06

## 2019-08-30 MED ORDER — DIVALPROEX ER 500 MG TABLET,EXTENDED RELEASE 24 HR
ORAL_TABLET | Freq: Every day | ORAL | 0 refills | 7.00000 days | Status: CP
Start: 2019-08-30 — End: 2019-09-06

## 2019-08-30 NOTE — ED Notes (Signed)
Received call from [facility] DSS. Per Ms. Shield pt is good to be discharged when medically cleared.

## 2019-08-30 NOTE — ED Notes (Signed)
Pt provided with meal tray and sprite
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
## 2019-08-30 NOTE — ED Notes (Signed)
Gus Puma from DSS is in the interview room with the patient. LouAnn from patient representative is present.

## 2019-08-30 NOTE — Care Management (Addendum)
TOC RN CM attempted to contact mother x2 with no answer and no option to leave voicemail. LVMM for nephew Triston requesting callback.   1302: TOC RN CM attempted to contact mother x2 with no answer and no option to leave voicemail.

## 2019-08-30 NOTE — ED Notes (Signed)
Patient requests to be released and to use the phone. ED tech reminded the patient of the phone rules.

## 2019-08-30 NOTE — ED Notes (Signed)
Pt c/o left rib cage pain 7/10. Pt unable to take a deep breath due to pain "it hurst when I cough or take a deep breath, my ribs hurt". This RN nitfied EDP Siadecki.

## 2019-08-30 NOTE — ED Notes (Signed)
[facility]  Personnel called and will be by the ED today to evaluate the patient.

## 2019-08-30 NOTE — Discharge Instructions (Addendum)
Please seek medical attention for any high fevers, chest pain, shortness of breath, change in behavior, persistent vomiting, bloody stool or any other new or concerning symptoms.  

## 2019-08-30 NOTE — ED Notes (Signed)
After hours DSS contacted and left message for on-call to call back.
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
## 2019-08-30 NOTE — ED Notes (Signed)
Pt up and ambulatory to bathroom.

## 2019-08-30 NOTE — Care Management (Addendum)
TOC RN CM: Spoke to patients mother who states she has been staying with her other son for several weeks due to patients increased confusion, drug use and aggression. Mother has been sending food to patient via her other son. Mother is working with DSS to get guardianship but does not have a timeline for when that will occur. Mother is agreeing to make sure patient has food and is able to assist with medications. Mother assists with managing his money as well. Mother states her best number for contact is through her son [name] @ [phone]. Mother is aware that patient will be returning home and will assist as needed but will not be living with the patient .

## 2019-08-30 NOTE — ED Notes (Signed)
[employer] called for transport. Voucher given. Patient is waiting in lobby. Allena Earing, Patient representative and Danne Baxter nurse aware.
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
## 2019-08-30 NOTE — ED Notes (Signed)
Attempted to call DSS there was no answer. Will continue to call.

## 2019-09-03 MED ORDER — CLONIDINE HCL 0.1 MG TABLET
ORAL_TABLET | Freq: Two times a day (BID) | ORAL | 3 refills | 90 days | Status: CP
Start: 2019-09-03 — End: 2020-09-02

## 2019-09-09 ENCOUNTER — Emergency Department
Admission: EM | Admit: 2019-09-09 | Discharge: 2019-09-10 | Disposition: A | Discharge status: Home / Self Care | Payer: Medicare HMO | Attending: Emergency Medicine | Admitting: Emergency Medicine

## 2019-09-09 ENCOUNTER — Encounter: Payer: Self-pay | Admitting: Emergency Medicine

## 2019-09-09 ENCOUNTER — Emergency Department: Payer: Medicare HMO

## 2019-09-09 ENCOUNTER — Other Ambulatory Visit: Payer: Self-pay

## 2019-09-09 DIAGNOSIS — Z79899 Other long term (current) drug therapy: Secondary | ICD-10-CM | POA: Diagnosis not present

## 2019-09-09 DIAGNOSIS — F329 Major depressive disorder, single episode, unspecified: Secondary | ICD-10-CM | POA: Insufficient documentation

## 2019-09-09 DIAGNOSIS — R45851 Suicidal ideations: Secondary | ICD-10-CM | POA: Insufficient documentation

## 2019-09-09 DIAGNOSIS — R0789 Other chest pain: Secondary | ICD-10-CM | POA: Insufficient documentation

## 2019-09-09 DIAGNOSIS — F17200 Nicotine dependence, unspecified, uncomplicated: Secondary | ICD-10-CM | POA: Insufficient documentation

## 2019-09-09 DIAGNOSIS — J45909 Unspecified asthma, uncomplicated: Secondary | ICD-10-CM | POA: Diagnosis not present

## 2019-09-09 DIAGNOSIS — Z20822 Contact with and (suspected) exposure to covid-19: Secondary | ICD-10-CM | POA: Diagnosis not present

## 2019-09-09 DIAGNOSIS — Z7984 Long term (current) use of oral hypoglycemic drugs: Secondary | ICD-10-CM | POA: Diagnosis not present

## 2019-09-09 DIAGNOSIS — F259 Schizoaffective disorder, unspecified: Secondary | ICD-10-CM | POA: Insufficient documentation

## 2019-09-09 DIAGNOSIS — E119 Type 2 diabetes mellitus without complications: Secondary | ICD-10-CM | POA: Insufficient documentation

## 2019-09-09 DIAGNOSIS — I10 Essential (primary) hypertension: Secondary | ICD-10-CM | POA: Insufficient documentation

## 2019-09-09 LAB — CBC
HCT: 43.2 % (ref 39.0–52.0)
Hemoglobin: 15 g/dL (ref 13.0–17.0)
MCH: 29.1 pg (ref 26.0–34.0)
MCHC: 34.7 g/dL (ref 30.0–36.0)
MCV: 83.9 fL (ref 80.0–100.0)
Platelets: 339 10*3/uL (ref 150–400)
RBC: 5.15 MIL/uL (ref 4.22–5.81)
RDW: 13 % (ref 11.5–15.5)
WBC: 13.4 10*3/uL — ABNORMAL HIGH (ref 4.0–10.5)
nRBC: 0 % (ref 0.0–0.2)

## 2019-09-09 LAB — COMPREHENSIVE METABOLIC PANEL WITH GFR
ALT: 22 U/L (ref 0–44)
AST: 16 U/L (ref 15–41)
Albumin: 3.9 g/dL (ref 3.5–5.0)
Alkaline Phosphatase: 86 U/L (ref 38–126)
Anion gap: 9 (ref 5–15)
BUN: 23 mg/dL — ABNORMAL HIGH (ref 6–20)
CO2: 28 mmol/L (ref 22–32)
Calcium: 9.1 mg/dL (ref 8.9–10.3)
Chloride: 96 mmol/L — ABNORMAL LOW (ref 98–111)
Creatinine, Ser: 1.36 mg/dL — ABNORMAL HIGH (ref 0.61–1.24)
GFR calc Af Amer: >60 mL/min
GFR calc non Af Amer: >60 mL/min
Glucose, Bld: 336 mg/dL — ABNORMAL HIGH (ref 70–99)
Potassium: 4.6 mmol/L (ref 3.5–5.1)
Sodium: 133 mmol/L — ABNORMAL LOW (ref 135–145)
Total Bilirubin: 0.4 mg/dL (ref 0.3–1.2)
Total Protein: 7.3 g/dL (ref 6.5–8.1)

## 2019-09-09 LAB — URINE DRUG SCREEN, QUALITATIVE (ARMC ONLY)
Amphetamines, Ur Screen: NOT DETECTED
Barbiturates, Ur Screen: NOT DETECTED
Benzodiazepine, Ur Scrn: NOT DETECTED
Cannabinoid 50 Ng, Ur ~~LOC~~: NOT DETECTED
Cocaine Metabolite,Ur ~~LOC~~: NOT DETECTED
MDMA (Ecstasy)Ur Screen: NOT DETECTED
Methadone Scn, Ur: NOT DETECTED
Opiate, Ur Screen: NOT DETECTED
Phencyclidine (PCP) Ur S: NOT DETECTED
Tricyclic, Ur Screen: NOT DETECTED

## 2019-09-09 LAB — ACETAMINOPHEN LEVEL: Acetaminophen (Tylenol), Serum: <10 ug/mL — ABNORMAL LOW (ref 10–30)

## 2019-09-09 LAB — SALICYLATE LEVEL: Salicylate Lvl: <7 mg/dL — ABNORMAL LOW (ref 7.0–30.0)

## 2019-09-09 LAB — ETHANOL: Alcohol, Ethyl (B): 10 mg/dL — ABNORMAL HIGH

## 2019-09-09 LAB — POC SARS CORONAVIRUS 2 AG: SARS Coronavirus 2 Ag: NEGATIVE

## 2019-09-09 MED ORDER — ARIPIPRAZOLE 5 MG PO TABS
15.0000 mg | ORAL_TABLET | Freq: Every day | ORAL | Status: DC
  Administered 2019-09-10: 15 mg via ORAL
  Filled 2019-09-09: qty 1

## 2019-09-09 MED ORDER — HYDROCHLOROTHIAZIDE 25 MG PO TABS
25.0000 mg | ORAL_TABLET | Freq: Every day | ORAL | Status: DC
  Administered 2019-09-09 – 2019-09-10 (×2): 25 mg via ORAL
  Filled 2019-09-09 (×2): qty 1

## 2019-09-09 MED ORDER — LISINOPRIL 20 MG PO TABS
40.0000 mg | ORAL_TABLET | Freq: Every day | ORAL | Status: DC
  Administered 2019-09-09 – 2019-09-10 (×2): 40 mg via ORAL
  Filled 2019-09-09: qty 4
  Filled 2019-09-09: qty 2

## 2019-09-09 MED ORDER — METFORMIN HCL 500 MG PO TABS
1000.0000 mg | ORAL_TABLET | Freq: Two times a day (BID) | ORAL | Status: DC
  Administered 2019-09-10: 1000 mg via ORAL
  Filled 2019-09-09: qty 2

## 2019-09-09 MED ORDER — ACETAMINOPHEN 500 MG PO TABS
1000.0000 mg | ORAL_TABLET | Freq: Once | ORAL | Status: AC
  Administered 2019-09-09: 1000 mg via ORAL
  Filled 2019-09-09: qty 2

## 2019-09-09 MED ORDER — AMLODIPINE BESYLATE 5 MG PO TABS
10.0000 mg | ORAL_TABLET | Freq: Every day | ORAL | Status: DC
  Administered 2019-09-09 – 2019-09-10 (×2): 10 mg via ORAL
  Filled 2019-09-09 (×2): qty 2

## 2019-09-09 MED ORDER — LIDOCAINE 5 % EX PTCH
1.0000 | MEDICATED_PATCH | CUTANEOUS | Status: DC
  Administered 2019-09-09: 1 via TRANSDERMAL
  Filled 2019-09-09 (×2): qty 1

## 2019-09-09 MED ORDER — CLONIDINE HCL 0.1 MG PO TABS
0.1000 mg | ORAL_TABLET | Freq: Two times a day (BID) | ORAL | Status: DC
  Administered 2019-09-09 – 2019-09-10 (×2): 0.1 mg via ORAL
  Filled 2019-09-09 (×2): qty 1

## 2019-09-09 NOTE — ED Notes (Signed)
Henry Shaffer from [employer] called prior to pt arrival and would like a call when the pt is up for discharge at [phone].
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
## 2019-09-09 NOTE — ED Notes (Signed)
Patient was given a snack tray and something to drink. 

## 2019-09-09 NOTE — ED Notes (Signed)
EDP, [place] to bedside. 

## 2019-09-09 NOTE — ED Notes (Signed)
TTS at bedside. 

## 2019-09-09 NOTE — ED Notes (Signed)
Pt transported to Xray. 
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
## 2019-09-09 NOTE — ED Notes (Signed)
Pt. Transferred to BHU from ED to room 5 after screening for contraband. Report to include Situation, Background, Assessment and Recommendations from [facility]. Pt. Oriented to unit including Q15 minute rounds as well as the security cameras for their protection. Patient is alert and oriented, warm and dry in no acute distress. Patient denies SI, HI, and AVH. Pt. Encouraged to let me know if needs arise.
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
## 2019-09-09 NOTE — ED Notes (Signed)
Pt given meal tray.

## 2019-09-10 LAB — GLUCOSE, CAPILLARY: Glucose-Capillary: 377 mg/dL — ABNORMAL HIGH (ref 70–99)

## 2019-09-10 MED ORDER — GABAPENTIN 100 MG PO CAPS
200.0000 mg | ORAL_CAPSULE | Freq: Three times a day (TID) | ORAL | Status: DC
  Administered 2019-09-10: 200 mg via ORAL
  Filled 2019-09-10: qty 2

## 2019-09-10 MED ORDER — ACETAMINOPHEN 325 MG PO TABS
650.0000 mg | ORAL_TABLET | Freq: Once | ORAL | Status: AC
  Administered 2019-09-10: 650 mg via ORAL

## 2019-09-10 MED ORDER — ACETAMINOPHEN 500 MG PO TABS
1000.0000 mg | ORAL_TABLET | Freq: Once | ORAL | Status: AC
  Administered 2019-09-10: 1000 mg via ORAL
  Filled 2019-09-10: qty 2

## 2019-09-10 NOTE — ED Notes (Signed)
Pt given supplies to take a shower. Pt is able to shower independently.  

## 2019-09-10 NOTE — ED Notes (Signed)
Hourly rounding reveals patient sleeping in room. No complaints, stable, in no acute distress. Q15 minute rounds and monitoring via Security Cameras to continue. 

## 2019-09-10 NOTE — ED Notes (Signed)
Hourly rounding reveals patient in room. Stable, in no acute distress. Q15 minute rounds and monitoring via Security Cameras to continue. 

## 2019-09-10 NOTE — ED Notes (Signed)
Pt given meal tray.

## 2019-09-10 NOTE — ED Provider Notes (Signed)
-----------------------------------------   12:21 AM on 09/10/2019 -----------------------------------------  Blood pressure (!) 160/99, pulse 83, temperature 98.6 F (37 C), temperature source Oral, resp. rate 18, height 5\' 6"  (1.676 m), weight 108.9 kg, SpO2 98 %.  The patient is calm and cooperative at this time.  There have been no acute events since the last update.  Awaiting disposition plan from Behavioral Medicine team.   , MD 09/10/19 [phone]
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
## 2019-09-10 NOTE — ED Notes (Signed)
Hourly rounding reveals patient awake in room. No complaints, stable, in no acute distress. Q15 minute rounds and monitoring via Security Cameras to continue. 

## 2019-09-13 ENCOUNTER — Encounter: Admit: 2019-09-13 | Discharge: 2019-09-14 | Payer: MEDICARE

## 2019-09-13 MED ORDER — GABAPENTIN 300 MG CAPSULE
ORAL_CAPSULE | Freq: Two times a day (BID) | ORAL | 3 refills | 60 days | Status: CP
Start: 2019-09-13 — End: 2020-09-12

## 2019-09-13 MED ORDER — INVEGA SUSTENNA 156 MG/ML INTRAMUSCULAR SYRINGE
Freq: Once | INTRAMUSCULAR | 2 refills | 1.00000 days | Status: CP
Start: 2019-09-13 — End: 2019-09-14
  Filled 2019-09-16: qty 1, 28d supply, fill #0

## 2019-09-13 MED ORDER — DIVALPROEX ER 500 MG TABLET,EXTENDED RELEASE 24 HR
ORAL_TABLET | Freq: Every day | ORAL | 3 refills | 90.00000 days | Status: CP
Start: 2019-09-13 — End: 2019-12-12

## 2019-09-13 MED ORDER — METFORMIN 1,000 MG TABLET
ORAL_TABLET | Freq: Two times a day (BID) | ORAL | 3 refills | 90.00000 days | Status: CP
Start: 2019-09-13 — End: 2020-09-07

## 2019-09-13 MED ORDER — ARIPIPRAZOLE 15 MG TABLET
ORAL_TABLET | Freq: Every day | ORAL | 3 refills | 90.00000 days | Status: CP
Start: 2019-09-13 — End: 2019-12-12

## 2019-09-13 MED ORDER — TRAZODONE 150 MG TABLET
ORAL_TABLET | Freq: Every evening | ORAL | 3 refills | 90.00000 days | Status: CP
Start: 2019-09-13 — End: 2020-09-07

## 2019-09-13 MED ORDER — INVEGA SUSTENNA 156 MG/ML INTRAMUSCULAR SYRINGE: 156 mg | mL | Freq: Once | 2 refills | 1 days | Status: AC

## 2019-09-16 ENCOUNTER — Encounter: Admit: 2019-09-16 | Discharge: 2019-09-17 | Payer: MEDICARE

## 2019-09-16 MED FILL — INVEGA SUSTENNA 156 MG/ML INTRAMUSCULAR SYRINGE: 28 days supply | Qty: 1 | Fill #0 | Status: AC

## 2019-09-24 MED ORDER — PALIPERIDONE ER 6 MG TABLET,EXTENDED RELEASE 24 HR
ORAL_TABLET | Freq: Every morning | ORAL | 0 refills | 30.00000 days | Status: CP
Start: 2019-09-24 — End: 2019-10-24

## 2019-10-07 ENCOUNTER — Ambulatory Visit: Admit: 2019-10-07 | Discharge: 2019-10-08 | Payer: MEDICARE

## 2019-10-07 DIAGNOSIS — F250 Schizoaffective disorder, bipolar type: Principal | ICD-10-CM

## 2019-10-07 MED ORDER — INVEGA SUSTENNA 156 MG/ML INTRAMUSCULAR SYRINGE
Freq: Once | INTRAMUSCULAR | 0 refills | 1.00000 days | Status: CP
Start: 2019-10-07 — End: 2019-10-07

## 2019-10-07 MED ORDER — INVEGA SUSTENNA 156 MG/ML INTRAMUSCULAR SYRINGE: 156 mg | mL | Freq: Once | 0 refills | 1 days | Status: AC

## 2019-10-07 MED ORDER — PALIPERIDONE ER 6 MG TABLET,EXTENDED RELEASE 24 HR
ORAL_TABLET | Freq: Every morning | ORAL | 0 refills | 30 days | Status: CP
Start: 2019-10-07 — End: 2019-11-06

## 2019-10-09 NOTE — Unmapped (Signed)
This patient has been disenrolled from the [facility] specialty pharmacy services due to a pharmacy change. The patient is now filling at [facility].    Adrian Robinson  [facility] Specialty Pharmacist

## 2019-10-15 ENCOUNTER — Institutional Professional Consult (permissible substitution): Admit: 2019-10-15 | Discharge: 2019-10-16 | Payer: MEDICARE

## 2019-10-15 DIAGNOSIS — F250 Schizoaffective disorder, bipolar type: Principal | ICD-10-CM

## 2019-10-28 NOTE — Unmapped (Signed)
[facility] Specialty Medication Onboarding    Specialty Medication: Invega 156 mg/mL Syringe  Prior Authorization: Not Required   Financial Assistance: No - copay  <$25  Final Copay/Day Supply: $9.20 / 28    Insurance Restrictions: None     Notes to Pharmacist: N/A    The triage team has completed the benefits investigation and has determined that the patient is able to fill this medication at [facility]. Please contact the patient to complete the onboarding or follow up with the prescribing physician as needed.
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
## 2019-11-04 DIAGNOSIS — F250 Schizoaffective disorder, bipolar type: Principal | ICD-10-CM

## 2019-11-04 MED ORDER — INVEGA SUSTENNA 156 MG/ML INTRAMUSCULAR SYRINGE
INTRAMUSCULAR | 2 refills | 30 days | Status: CP
Start: 2019-11-04 — End: ?

## 2019-11-04 NOTE — Unmapped (Signed)
Called patient regarding no show to scheduled appointment.  No answer, unable to leave a message.    Called patient's mother, no answer, unable to leave message.    Also tried alternative number for mother [phone]) that mother had provided to internal medicine clinic, but voicemail greeting was for [name] with hanging with beyond, so did not leave a message.    Patient already scheduled for nurse appointment on 11/12/2019 for next [facility] injection.  Will renew prescription for [name] prescription for next 3 months

## 2019-12-17 ENCOUNTER — Encounter: Admit: 2019-12-17 | Discharge: 2019-12-18 | Payer: MEDICARE

## 2020-01-01 DIAGNOSIS — E1165 Type 2 diabetes mellitus with hyperglycemia: Principal | ICD-10-CM

## 2020-01-01 DIAGNOSIS — F250 Schizoaffective disorder, bipolar type: Principal | ICD-10-CM

## 2020-01-01 MED ORDER — INVEGA SUSTENNA 156 MG/ML INTRAMUSCULAR SYRINGE
INTRAMUSCULAR | 0 refills | 28 days | Status: CP
Start: 2020-01-01 — End: ?

## 2020-01-01 MED ORDER — INVEGA SUSTENNA 234 MG/1.5 ML INTRAMUSCULAR SYRINGE
Freq: Once | INTRAMUSCULAR | 0 refills | 1.00000 days | Status: CP
Start: 2020-01-01 — End: 2020-01-01

## 2020-01-03 MED ORDER — ALBUTEROL SULFATE HFA 90 MCG/ACTUATION AEROSOL INHALER
3 refills | 0 days
Start: 2020-01-03 — End: ?

## 2020-02-18 DIAGNOSIS — F250 Schizoaffective disorder, bipolar type: Principal | ICD-10-CM

## 2020-02-18 MED ORDER — INVEGA SUSTENNA 156 MG/ML INTRAMUSCULAR SYRINGE
INTRAMUSCULAR | 0 refills | 28 days
Start: 2020-02-18 — End: ?

## 2020-02-24 MED ORDER — INVEGA SUSTENNA 156 MG/ML INTRAMUSCULAR SYRINGE
0 refills | 0 days
Start: 2020-02-24 — End: ?

## 2020-02-25 MED ORDER — INVEGA SUSTENNA 156 MG/ML INTRAMUSCULAR SYRINGE
3 refills | 0 days | Status: CP
Start: 2020-02-25 — End: ?

## 2020-03-24 ENCOUNTER — Ambulatory Visit: Admit: 2020-03-24 | Payer: MEDICARE

## 2020-04-08 MED ORDER — AMLODIPINE 10 MG TABLET
ORAL_TABLET | 2 refills | 0 days | Status: CP
Start: 2020-04-08 — End: ?

## 2020-04-08 MED ORDER — GABAPENTIN 300 MG CAPSULE
ORAL_CAPSULE | Freq: Two times a day (BID) | ORAL | 2 refills | 90.00000 days | Status: CP
Start: 2020-04-08 — End: 2021-04-08

## 2020-06-16 ENCOUNTER — Encounter: Admit: 2020-06-16 | Discharge: 2020-06-17 | Payer: MEDICARE

## 2020-06-16 DIAGNOSIS — I10 Essential (primary) hypertension: Principal | ICD-10-CM

## 2020-06-16 DIAGNOSIS — F250 Schizoaffective disorder, bipolar type: Principal | ICD-10-CM

## 2020-06-16 DIAGNOSIS — Z1159 Encounter for screening for other viral diseases: Principal | ICD-10-CM

## 2020-06-16 DIAGNOSIS — M79672 Pain in left foot: Principal | ICD-10-CM

## 2020-06-16 DIAGNOSIS — E119 Type 2 diabetes mellitus without complications: Principal | ICD-10-CM

## 2020-06-16 DIAGNOSIS — Z0000 Encounter for general adult medical examination without abnormal findings: Principal | ICD-10-CM

## 2020-06-16 DIAGNOSIS — Z7289 Other problems related to lifestyle: Principal | ICD-10-CM

## 2020-06-16 DIAGNOSIS — L600 Ingrowing nail: Principal | ICD-10-CM

## 2020-06-16 MED ORDER — GLIPIZIDE 5 MG TABLET
ORAL_TABLET | Freq: Two times a day (BID) | ORAL | 11 refills | 30.00000 days | Status: CP
Start: 2020-06-16 — End: 2021-06-16

## 2020-06-16 MED ORDER — FLUTICASONE PROPIONATE 50 MCG/ACTUATION NASAL SPRAY,SUSPENSION
Freq: Every day | NASAL | 5 refills | 123 days | Status: CP
Start: 2020-06-16 — End: ?

## 2020-06-26 DIAGNOSIS — F250 Schizoaffective disorder, bipolar type: Principal | ICD-10-CM

## 2020-06-26 MED ORDER — INVEGA SUSTENNA 156 MG/ML INTRAMUSCULAR SYRINGE
3 refills | 0 days
Start: 2020-06-26 — End: ?

## 2020-06-29 MED ORDER — INVEGA SUSTENNA 156 MG/ML INTRAMUSCULAR SYRINGE
6 refills | 0 days | Status: CP
Start: 2020-06-29 — End: ?

## 2020-07-05 MED ORDER — DIVALPROEX ER 500 MG TABLET,EXTENDED RELEASE 24 HR
ORAL_TABLET | 3 refills | 0 days
Start: 2020-07-05 — End: ?

## 2020-07-05 MED ORDER — LISINOPRIL 40 MG TABLET
ORAL_TABLET | 3 refills | 0 days | Status: CP
Start: 2020-07-05 — End: ?

## 2020-07-05 MED ORDER — HYDROCHLOROTHIAZIDE 25 MG TABLET
ORAL_TABLET | 3 refills | 0 days | Status: CP
Start: 2020-07-05 — End: ?

## 2020-07-21 DIAGNOSIS — F250 Schizoaffective disorder, bipolar type: Principal | ICD-10-CM

## 2020-08-03 MED ORDER — CLONIDINE HCL 0.1 MG TABLET
ORAL_TABLET | Freq: Two times a day (BID) | ORAL | 3 refills | 0.00000 days
Start: 2020-08-03 — End: ?

## 2020-08-04 MED ORDER — TRAZODONE 150 MG TABLET
ORAL_TABLET | 3 refills | 0.00000 days | Status: CP
Start: 2020-08-04 — End: ?
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
## 2020-09-21 MED ORDER — METFORMIN 1,000 MG TABLET
ORAL_TABLET | 3 refills | 0.00000 days | Status: CP
Start: 2020-09-21 — End: ?

## 2020-09-24 MED ORDER — ARIPIPRAZOLE 15 MG TABLET
ORAL_TABLET | Freq: Every day | ORAL | 2 refills | 90.00000 days
Start: 2020-09-24 — End: 2020-12-23

## 2020-11-24 ENCOUNTER — Ambulatory Visit: Admit: 2020-11-24 | Discharge: 2020-11-24 | Payer: MEDICARE

## 2020-11-24 DIAGNOSIS — H902 Conductive hearing loss, unspecified: Secondary | ICD-10-CM | POA: Diagnosis not present

## 2020-11-24 DIAGNOSIS — M25511 Pain in right shoulder: Secondary | ICD-10-CM | POA: Diagnosis not present

## 2020-11-24 DIAGNOSIS — R69 Illness, unspecified: Secondary | ICD-10-CM | POA: Diagnosis not present

## 2020-11-24 DIAGNOSIS — I10 Essential (primary) hypertension: Secondary | ICD-10-CM | POA: Diagnosis not present

## 2020-11-24 DIAGNOSIS — M7662 Achilles tendinitis, left leg: Secondary | ICD-10-CM | POA: Diagnosis not present

## 2020-11-24 DIAGNOSIS — E119 Type 2 diabetes mellitus without complications: Secondary | ICD-10-CM | POA: Diagnosis not present

## 2020-11-24 DIAGNOSIS — F250 Schizoaffective disorder, bipolar type: Principal | ICD-10-CM

## 2020-11-24 MED ORDER — INVEGA SUSTENNA 156 MG/ML INTRAMUSCULAR SYRINGE
INTRAMUSCULAR | 11 refills | 28.00000 days | Status: CP
Start: 2020-11-24 — End: ?

## 2020-11-24 MED ORDER — GABAPENTIN 300 MG CAPSULE
ORAL_CAPSULE | Freq: Two times a day (BID) | ORAL | 2 refills | 90 days
Start: 2020-11-24 — End: 2021-11-24

## 2020-11-24 MED ORDER — ALBUTEROL SULFATE HFA 90 MCG/ACTUATION AEROSOL INHALER
Freq: Four times a day (QID) | RESPIRATORY_TRACT | 3 refills | 0.00000 days | Status: CP | PRN
Start: 2020-11-24 — End: ?

## 2020-11-24 MED ORDER — BLOOD-GLUCOSE METER KIT WRAPPER
11 refills | 0 days | Status: CP
Start: 2020-11-24 — End: 2022-05-11

## 2020-11-30 MED ORDER — GABAPENTIN 300 MG CAPSULE
ORAL_CAPSULE | Freq: Two times a day (BID) | ORAL | 2 refills | 90.00000 days
Start: 2020-11-30 — End: 2021-11-30

## 2021-01-04 MED ORDER — GABAPENTIN 300 MG CAPSULE
ORAL_CAPSULE | Freq: Two times a day (BID) | ORAL | 2 refills | 90.00000 days | Status: CP
Start: 2021-01-04 — End: 2022-01-04

## 2021-01-14 ENCOUNTER — Ambulatory Visit: Admit: 2021-01-14 | Payer: MEDICARE

## 2021-01-26 ENCOUNTER — Ambulatory Visit: Admit: 2021-01-26 | Payer: MEDICARE

## 2021-01-31 MED ORDER — FLUTICASONE PROPIONATE 50 MCG/ACTUATION NASAL SPRAY,SUSPENSION
5 refills | 0.00000 days | Status: CP
Start: 2021-01-31 — End: ?

## 2021-02-16 ENCOUNTER — Ambulatory Visit: Admit: 2021-02-16 | Payer: MEDICARE

## 2021-02-23 ENCOUNTER — Ambulatory Visit: Admit: 2021-02-23 | Discharge: 2021-02-24 | Payer: MEDICARE

## 2021-02-23 DIAGNOSIS — R69 Illness, unspecified: Secondary | ICD-10-CM | POA: Diagnosis not present

## 2021-02-23 DIAGNOSIS — E1100 Type 2 diabetes mellitus with hyperosmolarity without nonketotic hyperglycemic-hyperosmolar coma (NKHHC): Secondary | ICD-10-CM | POA: Diagnosis not present

## 2021-02-23 DIAGNOSIS — I10 Essential (primary) hypertension: Secondary | ICD-10-CM | POA: Diagnosis not present

## 2021-02-23 DIAGNOSIS — Z23 Encounter for immunization: Secondary | ICD-10-CM | POA: Diagnosis not present

## 2021-02-23 DIAGNOSIS — F250 Schizoaffective disorder, bipolar type: Principal | ICD-10-CM

## 2021-02-23 MED ORDER — ATORVASTATIN 80 MG TABLET
ORAL_TABLET | Freq: Every day | ORAL | 3 refills | 90.00000 days
Start: 2021-02-23 — End: ?

## 2021-02-23 MED ORDER — BLOOD-GLUCOSE METER KIT WRAPPER
0 refills | 0 days | Status: CP
Start: 2021-02-23 — End: 2022-02-23

## 2021-02-23 MED ORDER — ARIPIPRAZOLE 15 MG TABLET
ORAL_TABLET | Freq: Every day | ORAL | 3 refills | 90.00000 days | Status: CP
Start: 2021-02-23 — End: 2022-02-18

## 2021-02-23 MED ORDER — AMLODIPINE 10 MG TABLET
ORAL_TABLET | Freq: Every day | ORAL | 3 refills | 90 days | Status: CP
Start: 2021-02-23 — End: ?

## 2021-02-23 MED ORDER — LANCETS
3 refills | 0.00000 days | Status: CP
Start: 2021-02-23 — End: ?

## 2021-02-23 MED ORDER — ACCU-CHEK GUIDE TEST STRIPS
ORAL_STRIP | SUBCUTANEOUS | 3 refills | 0.00000 days | Status: CP
Start: 2021-02-23 — End: ?

## 2021-02-23 MED ORDER — FLUTICASONE PROPIONATE 50 MCG/ACTUATION NASAL SPRAY,SUSPENSION
5 refills | 0.00000 days | Status: CP
Start: 2021-02-23 — End: ?

## 2021-04-22 MED ORDER — LISINOPRIL 40 MG TABLET
ORAL_TABLET | 3 refills | 0 days
Start: 2021-04-22 — End: ?

## 2021-04-23 MED ORDER — LISINOPRIL 40 MG TABLET
ORAL_TABLET | Freq: Every day | ORAL | 2 refills | 90 days | Status: CP
Start: 2021-04-23 — End: 2022-03-15

## 2021-05-14 ENCOUNTER — Ambulatory Visit: Admit: 2021-05-14 | Payer: MEDICARE

## 2021-06-10 MED ORDER — ALBUTEROL SULFATE HFA 90 MCG/ACTUATION AEROSOL INHALER
3 refills | 0 days
Start: 2021-06-10 — End: ?

## 2021-06-14 MED ORDER — ALBUTEROL SULFATE HFA 90 MCG/ACTUATION AEROSOL INHALER
3 refills | 0.00000 days | Status: CP
Start: 2021-06-14 — End: ?

## 2021-07-21 MED ORDER — GLIPIZIDE 5 MG TABLET
ORAL_TABLET | 3 refills | 0 days
Start: 2021-07-21 — End: ?

## 2021-07-21 MED ORDER — HYDROCHLOROTHIAZIDE 25 MG TABLET
ORAL_TABLET | 3 refills | 0 days
Start: 2021-07-21 — End: ?

## 2021-07-22 MED ORDER — GLIPIZIDE 5 MG TABLET
ORAL_TABLET | 2 refills | 0 days | Status: CP
Start: 2021-07-22 — End: ?

## 2021-07-22 MED ORDER — HYDROCHLOROTHIAZIDE 25 MG TABLET
ORAL_TABLET | 2 refills | 0.00000 days | Status: CP
Start: 2021-07-22 — End: ?

## 2021-08-16 MED ORDER — CLONIDINE HCL 0.1 MG TABLET
ORAL_TABLET | Freq: Two times a day (BID) | ORAL | 3 refills | 90.00000 days
Start: 2021-08-16 — End: 2022-08-16

## 2021-08-16 MED ORDER — DIVALPROEX ER 500 MG TABLET,EXTENDED RELEASE 24 HR
ORAL_TABLET | Freq: Every day | ORAL | 3 refills | 90.00000 days
Start: 2021-08-16 — End: ?

## 2021-08-20 MED ORDER — CLONIDINE HCL 0.1 MG TABLET
ORAL_TABLET | Freq: Two times a day (BID) | ORAL | 3 refills | 90 days | Status: CP
Start: 2021-08-20 — End: 2022-08-20

## 2021-08-20 MED ORDER — DIVALPROEX ER 500 MG TABLET,EXTENDED RELEASE 24 HR
ORAL_TABLET | Freq: Every day | ORAL | 3 refills | 90 days | Status: CP
Start: 2021-08-20 — End: ?

## 2021-09-01 MED ORDER — TRAZODONE 150 MG TABLET
ORAL_TABLET | Freq: Every evening | ORAL | 3 refills | 90.00000 days
Start: 2021-09-01 — End: ?

## 2021-09-02 MED ORDER — TRAZODONE 150 MG TABLET
ORAL_TABLET | Freq: Every evening | ORAL | 3 refills | 90.00000 days
Start: 2021-09-02 — End: ?

## 2021-09-03 MED ORDER — TRAZODONE 150 MG TABLET
ORAL_TABLET | Freq: Every evening | ORAL | 3 refills | 90.00000 days | Status: CP
Start: 2021-09-03 — End: ?

## 2021-10-22 MED ORDER — METFORMIN 1,000 MG TABLET
ORAL_TABLET | Freq: Two times a day (BID) | ORAL | 1 refills | 90 days | Status: CP
Start: 2021-10-22 — End: 2022-02-23

## 2021-10-24 ENCOUNTER — Ambulatory Visit: Admit: 2021-10-24 | Discharge: 2021-10-25 | Disposition: A | Discharge status: Home / Self Care | Payer: MEDICARE

## 2021-10-24 DIAGNOSIS — Z8673 Personal history of transient ischemic attack (TIA), and cerebral infarction without residual deficits: Secondary | ICD-10-CM | POA: Diagnosis not present

## 2021-10-24 DIAGNOSIS — K589 Irritable bowel syndrome without diarrhea: Secondary | ICD-10-CM | POA: Diagnosis not present

## 2021-10-24 DIAGNOSIS — I2510 Atherosclerotic heart disease of native coronary artery without angina pectoris: Secondary | ICD-10-CM | POA: Diagnosis not present

## 2021-10-24 DIAGNOSIS — F1290 Cannabis use, unspecified, uncomplicated: Secondary | ICD-10-CM | POA: Diagnosis not present

## 2021-10-24 DIAGNOSIS — Z8661 Personal history of infections of the central nervous system: Secondary | ICD-10-CM | POA: Diagnosis not present

## 2021-10-24 DIAGNOSIS — I129 Hypertensive chronic kidney disease with stage 1 through stage 4 chronic kidney disease, or unspecified chronic kidney disease: Secondary | ICD-10-CM | POA: Diagnosis not present

## 2021-10-24 DIAGNOSIS — I252 Old myocardial infarction: Secondary | ICD-10-CM | POA: Diagnosis not present

## 2021-10-24 DIAGNOSIS — E1122 Type 2 diabetes mellitus with diabetic chronic kidney disease: Secondary | ICD-10-CM | POA: Diagnosis not present

## 2021-10-24 DIAGNOSIS — Z8249 Family history of ischemic heart disease and other diseases of the circulatory system: Secondary | ICD-10-CM | POA: Diagnosis not present

## 2021-10-24 DIAGNOSIS — F17210 Nicotine dependence, cigarettes, uncomplicated: Secondary | ICD-10-CM | POA: Diagnosis not present

## 2021-10-24 DIAGNOSIS — Z20822 Contact with and (suspected) exposure to covid-19: Secondary | ICD-10-CM | POA: Diagnosis not present

## 2021-10-24 DIAGNOSIS — R69 Illness, unspecified: Secondary | ICD-10-CM | POA: Diagnosis not present

## 2021-10-24 DIAGNOSIS — F250 Schizoaffective disorder, bipolar type: Secondary | ICD-10-CM | POA: Diagnosis not present

## 2021-10-24 DIAGNOSIS — Z833 Family history of diabetes mellitus: Secondary | ICD-10-CM | POA: Diagnosis not present

## 2021-10-24 DIAGNOSIS — R600 Localized edema: Secondary | ICD-10-CM | POA: Diagnosis not present

## 2021-10-24 DIAGNOSIS — J45909 Unspecified asthma, uncomplicated: Secondary | ICD-10-CM | POA: Diagnosis not present

## 2021-10-24 DIAGNOSIS — F22 Delusional disorders: Secondary | ICD-10-CM | POA: Diagnosis not present

## 2021-10-24 DIAGNOSIS — R000 Tachycardia, unspecified: Secondary | ICD-10-CM | POA: Diagnosis not present

## 2021-10-24 DIAGNOSIS — N189 Chronic kidney disease, unspecified: Secondary | ICD-10-CM | POA: Diagnosis not present

## 2021-11-15 ENCOUNTER — Ambulatory Visit: Admit: 2021-11-15 | Discharge: 2021-11-16 | Payer: MEDICARE

## 2021-11-15 DIAGNOSIS — E1159 Type 2 diabetes mellitus with other circulatory complications: Secondary | ICD-10-CM | POA: Diagnosis not present

## 2021-11-15 DIAGNOSIS — R002 Palpitations: Secondary | ICD-10-CM | POA: Diagnosis not present

## 2021-11-15 DIAGNOSIS — I2510 Atherosclerotic heart disease of native coronary artery without angina pectoris: Secondary | ICD-10-CM | POA: Diagnosis not present

## 2021-11-15 DIAGNOSIS — I517 Cardiomegaly: Secondary | ICD-10-CM | POA: Diagnosis not present

## 2021-11-16 DIAGNOSIS — R002 Palpitations: Secondary | ICD-10-CM | POA: Diagnosis not present

## 2021-11-18 MED ORDER — MAGNESIUM OXIDE 400 MG (241.3 MG MAGNESIUM) TABLET
ORAL_TABLET | Freq: Every day | ORAL | 11 refills | 30 days | Status: CP
Start: 2021-11-18 — End: 2022-11-18

## 2021-11-24 ENCOUNTER — Ambulatory Visit
Admit: 2021-11-24 | Discharge: 2021-11-25 | Payer: MEDICARE | Attending: Student in an Organized Health Care Education/Training Program | Primary: Student in an Organized Health Care Education/Training Program

## 2021-11-24 ENCOUNTER — Ambulatory Visit: Admit: 2021-11-24 | Discharge: 2021-11-25 | Payer: MEDICARE

## 2021-11-24 DIAGNOSIS — R002 Palpitations: Secondary | ICD-10-CM | POA: Diagnosis not present

## 2021-11-24 DIAGNOSIS — I517 Cardiomegaly: Secondary | ICD-10-CM | POA: Diagnosis not present

## 2021-11-24 DIAGNOSIS — I4510 Unspecified right bundle-branch block: Secondary | ICD-10-CM | POA: Diagnosis not present

## 2021-11-24 DIAGNOSIS — I491 Atrial premature depolarization: Secondary | ICD-10-CM | POA: Diagnosis not present

## 2021-11-26 ENCOUNTER — Ambulatory Visit: Admit: 2021-11-26 | Discharge: 2021-11-27 | Payer: MEDICARE

## 2021-11-26 DIAGNOSIS — F250 Schizoaffective disorder, bipolar type: Secondary | ICD-10-CM | POA: Diagnosis not present

## 2021-11-26 DIAGNOSIS — R69 Illness, unspecified: Secondary | ICD-10-CM | POA: Diagnosis not present

## 2021-11-26 DIAGNOSIS — R790 Abnormal level of blood mineral: Secondary | ICD-10-CM | POA: Diagnosis not present

## 2021-11-26 DIAGNOSIS — R002 Palpitations: Secondary | ICD-10-CM | POA: Diagnosis not present

## 2021-11-26 DIAGNOSIS — E1100 Type 2 diabetes mellitus with hyperosmolarity without nonketotic hyperglycemic-hyperosmolar coma (NKHHC): Secondary | ICD-10-CM | POA: Diagnosis not present

## 2021-11-26 DIAGNOSIS — I10 Essential (primary) hypertension: Secondary | ICD-10-CM | POA: Diagnosis not present

## 2021-11-26 MED ORDER — INVEGA SUSTENNA 156 MG/ML INTRAMUSCULAR SYRINGE
INTRAMUSCULAR | 11 refills | 28 days | Status: CP
Start: 2021-11-26 — End: ?

## 2021-11-27 DIAGNOSIS — R002 Palpitations: Secondary | ICD-10-CM | POA: Diagnosis not present

## 2021-12-02 DIAGNOSIS — R002 Palpitations: Secondary | ICD-10-CM | POA: Diagnosis not present

## 2021-12-03 MED ORDER — METOPROLOL TARTRATE 25 MG TABLET
ORAL_TABLET | Freq: Two times a day (BID) | ORAL | 7 refills | 45 days | Status: CP
Start: 2021-12-03 — End: 2022-12-03

## 2022-01-08 MED ORDER — GABAPENTIN 300 MG CAPSULE
ORAL_CAPSULE | 2 refills | 0 days
Start: 2022-01-08 — End: ?

## 2022-01-10 ENCOUNTER — Ambulatory Visit
Admit: 2022-01-10 | Discharge: 2022-01-11 | Disposition: A | Discharge status: Other Acute Inpatient Hospital | Payer: MEDICARE | Attending: Family

## 2022-01-10 DIAGNOSIS — F22 Delusional disorders: Secondary | ICD-10-CM | POA: Diagnosis not present

## 2022-01-10 DIAGNOSIS — Z20822 Contact with and (suspected) exposure to covid-19: Secondary | ICD-10-CM | POA: Diagnosis not present

## 2022-01-10 DIAGNOSIS — I10 Essential (primary) hypertension: Secondary | ICD-10-CM | POA: Diagnosis not present

## 2022-01-10 DIAGNOSIS — R9431 Abnormal electrocardiogram [ECG] [EKG]: Secondary | ICD-10-CM | POA: Diagnosis not present

## 2022-01-10 DIAGNOSIS — I4510 Unspecified right bundle-branch block: Secondary | ICD-10-CM | POA: Diagnosis not present

## 2022-01-10 DIAGNOSIS — I252 Old myocardial infarction: Secondary | ICD-10-CM | POA: Diagnosis not present

## 2022-01-10 DIAGNOSIS — Z008 Encounter for other general examination: Secondary | ICD-10-CM | POA: Diagnosis not present

## 2022-01-10 DIAGNOSIS — Z833 Family history of diabetes mellitus: Secondary | ICD-10-CM | POA: Diagnosis not present

## 2022-01-10 DIAGNOSIS — E119 Type 2 diabetes mellitus without complications: Secondary | ICD-10-CM | POA: Diagnosis not present

## 2022-01-10 DIAGNOSIS — F250 Schizoaffective disorder, bipolar type: Secondary | ICD-10-CM | POA: Diagnosis not present

## 2022-01-10 DIAGNOSIS — Z8673 Personal history of transient ischemic attack (TIA), and cerebral infarction without residual deficits: Secondary | ICD-10-CM | POA: Diagnosis not present

## 2022-01-10 DIAGNOSIS — R69 Illness, unspecified: Secondary | ICD-10-CM | POA: Diagnosis not present

## 2022-01-11 DIAGNOSIS — R69 Illness, unspecified: Secondary | ICD-10-CM | POA: Diagnosis not present

## 2022-01-11 DIAGNOSIS — R2242 Localized swelling, mass and lump, left lower limb: Secondary | ICD-10-CM | POA: Diagnosis not present

## 2022-01-11 DIAGNOSIS — G4700 Insomnia, unspecified: Secondary | ICD-10-CM | POA: Diagnosis not present

## 2022-01-11 DIAGNOSIS — R9431 Abnormal electrocardiogram [ECG] [EKG]: Secondary | ICD-10-CM | POA: Diagnosis not present

## 2022-01-11 DIAGNOSIS — F250 Schizoaffective disorder, bipolar type: Secondary | ICD-10-CM | POA: Diagnosis not present

## 2022-01-11 DIAGNOSIS — Z9151 Personal history of suicidal behavior: Secondary | ICD-10-CM | POA: Diagnosis not present

## 2022-01-11 DIAGNOSIS — R059 Cough, unspecified: Secondary | ICD-10-CM | POA: Diagnosis not present

## 2022-01-11 DIAGNOSIS — F259 Schizoaffective disorder, unspecified: Secondary | ICD-10-CM | POA: Diagnosis not present

## 2022-01-11 DIAGNOSIS — F12150 Cannabis abuse with psychotic disorder with delusions: Secondary | ICD-10-CM | POA: Diagnosis not present

## 2022-01-11 DIAGNOSIS — Z7984 Long term (current) use of oral hypoglycemic drugs: Secondary | ICD-10-CM | POA: Diagnosis not present

## 2022-01-11 DIAGNOSIS — Z8673 Personal history of transient ischemic attack (TIA), and cerebral infarction without residual deficits: Secondary | ICD-10-CM | POA: Diagnosis not present

## 2022-01-11 DIAGNOSIS — R45851 Suicidal ideations: Secondary | ICD-10-CM | POA: Diagnosis not present

## 2022-01-11 DIAGNOSIS — F22 Delusional disorders: Secondary | ICD-10-CM | POA: Diagnosis not present

## 2022-01-11 DIAGNOSIS — Z833 Family history of diabetes mellitus: Secondary | ICD-10-CM | POA: Diagnosis not present

## 2022-01-11 DIAGNOSIS — Z20822 Contact with and (suspected) exposure to covid-19: Secondary | ICD-10-CM | POA: Diagnosis not present

## 2022-01-11 DIAGNOSIS — I4510 Unspecified right bundle-branch block: Secondary | ICD-10-CM | POA: Diagnosis not present

## 2022-01-11 DIAGNOSIS — I10 Essential (primary) hypertension: Secondary | ICD-10-CM | POA: Diagnosis not present

## 2022-01-11 DIAGNOSIS — Z720 Tobacco use: Secondary | ICD-10-CM | POA: Diagnosis not present

## 2022-01-11 DIAGNOSIS — E785 Hyperlipidemia, unspecified: Secondary | ICD-10-CM | POA: Diagnosis not present

## 2022-01-11 DIAGNOSIS — J449 Chronic obstructive pulmonary disease, unspecified: Secondary | ICD-10-CM | POA: Diagnosis not present

## 2022-01-11 DIAGNOSIS — I252 Old myocardial infarction: Secondary | ICD-10-CM | POA: Diagnosis not present

## 2022-01-11 DIAGNOSIS — Z008 Encounter for other general examination: Secondary | ICD-10-CM | POA: Diagnosis not present

## 2022-01-11 DIAGNOSIS — Z62810 Personal history of physical and sexual abuse in childhood: Secondary | ICD-10-CM | POA: Diagnosis not present

## 2022-01-11 DIAGNOSIS — E119 Type 2 diabetes mellitus without complications: Secondary | ICD-10-CM | POA: Diagnosis not present

## 2022-01-11 DIAGNOSIS — J029 Acute pharyngitis, unspecified: Secondary | ICD-10-CM | POA: Diagnosis not present

## 2022-01-11 MED ORDER — GABAPENTIN 300 MG CAPSULE
ORAL_CAPSULE | 2 refills | 0 days | Status: CP
Start: 2022-01-11 — End: ?

## 2022-01-26 ENCOUNTER — Ambulatory Visit
Admit: 2022-01-26 | Payer: MEDICARE | Attending: Student in an Organized Health Care Education/Training Program | Primary: Student in an Organized Health Care Education/Training Program

## 2022-02-11 MED ORDER — ALBUTEROL SULFATE HFA 90 MCG/ACTUATION AEROSOL INHALER
3 refills | 0 days
Start: 2022-02-11 — End: ?

## 2022-02-14 MED ORDER — ALBUTEROL SULFATE HFA 90 MCG/ACTUATION AEROSOL INHALER
Freq: Four times a day (QID) | RESPIRATORY_TRACT | 3 refills | 0 days | Status: CP | PRN
Start: 2022-02-14 — End: 2022-11-27

## 2022-02-22 MED ORDER — HYDROCHLOROTHIAZIDE 25 MG TABLET
ORAL_TABLET | 2 refills | 0 days | Status: CP
Start: 2022-02-22 — End: ?

## 2022-02-22 MED ORDER — AMLODIPINE 10 MG TABLET
ORAL_TABLET | 2 refills | 0 days | Status: CP
Start: 2022-02-22 — End: ?

## 2022-03-04 MED ORDER — GLIPIZIDE 5 MG TABLET
ORAL_TABLET | Freq: Two times a day (BID) | ORAL | 2 refills | 0 days
Start: 2022-03-04 — End: ?

## 2022-03-04 MED ORDER — ARIPIPRAZOLE 15 MG TABLET
ORAL_TABLET | Freq: Every day | ORAL | 3 refills | 0 days
Start: 2022-03-04 — End: ?

## 2022-03-07 MED ORDER — ARIPIPRAZOLE 15 MG TABLET
ORAL_TABLET | Freq: Every day | ORAL | 3 refills | 90 days
Start: 2022-03-07 — End: 2023-03-02

## 2022-03-07 MED ORDER — GLIPIZIDE 5 MG TABLET
ORAL_TABLET | Freq: Two times a day (BID) | ORAL | 2 refills | 90 days | Status: CP
Start: 2022-03-07 — End: ?

## 2022-03-08 MED ORDER — ARIPIPRAZOLE 15 MG TABLET
ORAL_TABLET | Freq: Every day | ORAL | 3 refills | 90 days | Status: CP
Start: 2022-03-08 — End: 2023-03-03

## 2022-03-08 MED ORDER — LISINOPRIL 40 MG TABLET
ORAL_TABLET | Freq: Every day | ORAL | 2 refills | 90 days | Status: CP
Start: 2022-03-08 — End: ?

## 2022-03-10 ENCOUNTER — Ambulatory Visit: Admit: 2022-03-10 | Payer: MEDICARE

## 2022-03-11 ENCOUNTER — Ambulatory Visit: Admit: 2022-03-11 | Discharge: 2022-03-12 | Payer: MEDICARE

## 2022-03-11 DIAGNOSIS — F250 Schizoaffective disorder, bipolar type: Principal | ICD-10-CM

## 2022-03-14 ENCOUNTER — Ambulatory Visit: Admit: 2022-03-14 | Discharge: 2022-03-15 | Payer: MEDICARE

## 2022-03-17 ENCOUNTER — Ambulatory Visit: Admit: 2022-03-17 | Discharge: 2022-03-18 | Payer: MEDICARE

## 2022-03-17 DIAGNOSIS — R69 Illness, unspecified: Secondary | ICD-10-CM | POA: Diagnosis not present

## 2022-03-17 DIAGNOSIS — I10 Essential (primary) hypertension: Secondary | ICD-10-CM | POA: Diagnosis not present

## 2022-03-17 DIAGNOSIS — Z1211 Encounter for screening for malignant neoplasm of colon: Secondary | ICD-10-CM | POA: Diagnosis not present

## 2022-03-17 DIAGNOSIS — E1100 Type 2 diabetes mellitus with hyperosmolarity without nonketotic hyperglycemic-hyperosmolar coma (NKHHC): Secondary | ICD-10-CM | POA: Diagnosis not present

## 2022-03-17 DIAGNOSIS — F250 Schizoaffective disorder, bipolar type: Principal | ICD-10-CM

## 2022-03-17 MED ORDER — NICOTINE (POLACRILEX) 4 MG GUM
BUCCAL | 3 refills | 5 days | Status: CP | PRN
Start: 2022-03-17 — End: ?

## 2022-03-17 MED ORDER — ACCU-CHEK GUIDE TEST STRIPS
ORAL_STRIP | 3 refills | 0 days | Status: CP
Start: 2022-03-17 — End: ?

## 2022-03-17 MED ORDER — METFORMIN 1,000 MG TABLET
ORAL_TABLET | Freq: Two times a day (BID) | ORAL | 3 refills | 90 days | Status: CP
Start: 2022-03-17 — End: ?

## 2022-03-17 MED ORDER — FLUTICASONE PROPIONATE 50 MCG/ACTUATION NASAL SPRAY,SUSPENSION
5 refills | 0 days | Status: CP
Start: 2022-03-17 — End: ?

## 2022-03-17 MED ORDER — ALBUTEROL SULFATE HFA 90 MCG/ACTUATION AEROSOL INHALER
Freq: Four times a day (QID) | RESPIRATORY_TRACT | 3 refills | 0 days | Status: CP | PRN
Start: 2022-03-17 — End: 2022-12-28

## 2022-03-17 MED ORDER — ARIPIPRAZOLE 15 MG TABLET
ORAL_TABLET | Freq: Every day | ORAL | 3 refills | 90 days | Status: CP
Start: 2022-03-17 — End: 2023-03-12

## 2022-03-17 MED ORDER — AMLODIPINE 10 MG TABLET
ORAL_TABLET | Freq: Every day | ORAL | 3 refills | 90 days | Status: CP
Start: 2022-03-17 — End: ?

## 2022-03-17 MED ORDER — LANCETS
3 refills | 0 days | Status: CP
Start: 2022-03-17 — End: ?

## 2022-03-17 MED ORDER — HYDROCHLOROTHIAZIDE 25 MG TABLET
ORAL_TABLET | Freq: Every day | ORAL | 3 refills | 90 days | Status: CP
Start: 2022-03-17 — End: ?

## 2022-03-17 MED ORDER — CLONIDINE HCL 0.1 MG TABLET
ORAL_TABLET | Freq: Two times a day (BID) | ORAL | 3 refills | 90 days | Status: CN
Start: 2022-03-17 — End: 2023-03-17

## 2022-03-17 MED ORDER — GLIPIZIDE 5 MG TABLET
ORAL_TABLET | Freq: Two times a day (BID) | ORAL | 3 refills | 90 days | Status: CP
Start: 2022-03-17 — End: ?

## 2022-03-17 MED ORDER — METOPROLOL TARTRATE 25 MG TABLET
ORAL_TABLET | Freq: Two times a day (BID) | ORAL | 3 refills | 90 days | Status: CP
Start: 2022-03-17 — End: 2023-03-17

## 2022-03-17 MED ORDER — TRAZODONE 50 MG TABLET
ORAL_TABLET | Freq: Every evening | ORAL | 3 refills | 90 days | Status: CP
Start: 2022-03-17 — End: ?

## 2022-03-17 MED ORDER — DIVALPROEX ER 500 MG TABLET,EXTENDED RELEASE 24 HR
ORAL_TABLET | Freq: Every day | ORAL | 3 refills | 90 days | Status: CP
Start: 2022-03-17 — End: ?

## 2022-03-17 MED ORDER — INVEGA SUSTENNA 156 MG/ML INTRAMUSCULAR SYRINGE
INTRAMUSCULAR | 11 refills | 28 days | Status: CP
Start: 2022-03-17 — End: ?

## 2022-03-17 MED ORDER — BLOOD-GLUCOSE METER KIT WRAPPER
11 refills | 0 days | Status: CP
Start: 2022-03-17 — End: 2023-03-17

## 2022-03-17 MED ORDER — GABAPENTIN 300 MG CAPSULE
ORAL_CAPSULE | Freq: Two times a day (BID) | ORAL | 3 refills | 90 days | Status: CP
Start: 2022-03-17 — End: ?

## 2022-03-17 MED ORDER — LISINOPRIL 40 MG TABLET
ORAL_TABLET | Freq: Every day | ORAL | 3 refills | 90 days | Status: CP
Start: 2022-03-17 — End: ?

## 2022-03-20 ENCOUNTER — Ambulatory Visit: Admit: 2022-03-20 | Discharge: 2022-03-21 | Payer: MEDICARE

## 2022-03-20 DIAGNOSIS — I252 Old myocardial infarction: Secondary | ICD-10-CM | POA: Diagnosis not present

## 2022-03-20 DIAGNOSIS — G4730 Sleep apnea, unspecified: Secondary | ICD-10-CM | POA: Diagnosis not present

## 2022-03-20 DIAGNOSIS — R0602 Shortness of breath: Secondary | ICD-10-CM | POA: Diagnosis not present

## 2022-03-20 DIAGNOSIS — Z885 Allergy status to narcotic agent status: Secondary | ICD-10-CM | POA: Diagnosis not present

## 2022-03-20 DIAGNOSIS — Z87828 Personal history of other (healed) physical injury and trauma: Secondary | ICD-10-CM | POA: Diagnosis not present

## 2022-03-20 DIAGNOSIS — Z882 Allergy status to sulfonamides status: Secondary | ICD-10-CM | POA: Diagnosis not present

## 2022-03-20 DIAGNOSIS — E8770 Fluid overload, unspecified: Secondary | ICD-10-CM | POA: Diagnosis not present

## 2022-03-20 DIAGNOSIS — R0600 Dyspnea, unspecified: Secondary | ICD-10-CM | POA: Diagnosis not present

## 2022-03-20 DIAGNOSIS — E785 Hyperlipidemia, unspecified: Secondary | ICD-10-CM | POA: Diagnosis not present

## 2022-03-20 DIAGNOSIS — Z20822 Contact with and (suspected) exposure to covid-19: Secondary | ICD-10-CM | POA: Diagnosis not present

## 2022-03-20 DIAGNOSIS — R778 Other specified abnormalities of plasma proteins: Secondary | ICD-10-CM | POA: Diagnosis not present

## 2022-03-20 DIAGNOSIS — I2510 Atherosclerotic heart disease of native coronary artery without angina pectoris: Secondary | ICD-10-CM | POA: Diagnosis not present

## 2022-03-20 DIAGNOSIS — Z881 Allergy status to other antibiotic agents status: Secondary | ICD-10-CM | POA: Diagnosis not present

## 2022-03-20 DIAGNOSIS — I4729 Other ventricular tachycardia: Secondary | ICD-10-CM | POA: Diagnosis not present

## 2022-03-20 DIAGNOSIS — I10 Essential (primary) hypertension: Secondary | ICD-10-CM | POA: Diagnosis not present

## 2022-03-20 DIAGNOSIS — R519 Headache, unspecified: Secondary | ICD-10-CM | POA: Diagnosis not present

## 2022-03-20 DIAGNOSIS — R0789 Other chest pain: Secondary | ICD-10-CM | POA: Diagnosis not present

## 2022-03-20 DIAGNOSIS — R600 Localized edema: Secondary | ICD-10-CM | POA: Diagnosis not present

## 2022-03-20 DIAGNOSIS — Z79899 Other long term (current) drug therapy: Secondary | ICD-10-CM | POA: Diagnosis not present

## 2022-03-20 DIAGNOSIS — I161 Hypertensive emergency: Secondary | ICD-10-CM | POA: Diagnosis not present

## 2022-03-20 DIAGNOSIS — Z7984 Long term (current) use of oral hypoglycemic drugs: Secondary | ICD-10-CM | POA: Diagnosis not present

## 2022-03-20 DIAGNOSIS — R69 Illness, unspecified: Secondary | ICD-10-CM | POA: Diagnosis not present

## 2022-03-20 DIAGNOSIS — E1169 Type 2 diabetes mellitus with other specified complication: Secondary | ICD-10-CM | POA: Diagnosis not present

## 2022-03-20 DIAGNOSIS — F17210 Nicotine dependence, cigarettes, uncomplicated: Secondary | ICD-10-CM | POA: Diagnosis not present

## 2022-03-20 DIAGNOSIS — H547 Unspecified visual loss: Secondary | ICD-10-CM | POA: Diagnosis not present

## 2022-03-20 DIAGNOSIS — I493 Ventricular premature depolarization: Secondary | ICD-10-CM | POA: Diagnosis not present

## 2022-03-20 DIAGNOSIS — F250 Schizoaffective disorder, bipolar type: Secondary | ICD-10-CM | POA: Diagnosis not present

## 2022-03-21 DIAGNOSIS — R0600 Dyspnea, unspecified: Secondary | ICD-10-CM | POA: Diagnosis not present

## 2022-03-21 DIAGNOSIS — R944 Abnormal results of kidney function studies: Secondary | ICD-10-CM | POA: Diagnosis not present

## 2022-03-21 DIAGNOSIS — F17210 Nicotine dependence, cigarettes, uncomplicated: Secondary | ICD-10-CM | POA: Diagnosis not present

## 2022-03-21 DIAGNOSIS — I10 Essential (primary) hypertension: Secondary | ICD-10-CM | POA: Diagnosis not present

## 2022-03-21 DIAGNOSIS — R778 Other specified abnormalities of plasma proteins: Secondary | ICD-10-CM | POA: Diagnosis not present

## 2022-03-21 DIAGNOSIS — R609 Edema, unspecified: Secondary | ICD-10-CM | POA: Diagnosis not present

## 2022-03-21 DIAGNOSIS — R519 Headache, unspecified: Secondary | ICD-10-CM | POA: Diagnosis not present

## 2022-03-21 DIAGNOSIS — R0789 Other chest pain: Secondary | ICD-10-CM | POA: Diagnosis not present

## 2022-03-21 DIAGNOSIS — R69 Illness, unspecified: Secondary | ICD-10-CM | POA: Diagnosis not present

## 2022-03-22 DIAGNOSIS — Z09 Encounter for follow-up examination after completed treatment for conditions other than malignant neoplasm: Principal | ICD-10-CM

## 2022-03-22 MED ORDER — ISOSORBIDE MONONITRATE ER 30 MG TABLET,EXTENDED RELEASE 24 HR
ORAL_TABLET | Freq: Every day | ORAL | 0 refills | 30 days | Status: CP
Start: 2022-03-22 — End: 2023-03-22

## 2022-04-01 ENCOUNTER — Ambulatory Visit
Admit: 2022-04-01 | Payer: MEDICARE | Attending: Student in an Organized Health Care Education/Training Program | Primary: Student in an Organized Health Care Education/Training Program

## 2022-04-05 ENCOUNTER — Ambulatory Visit: Admit: 2022-04-05 | Payer: MEDICARE | Attending: Pharmacist | Primary: Pharmacist

## 2022-04-12 ENCOUNTER — Ambulatory Visit
Admit: 2022-04-12 | Payer: MEDICARE | Attending: Student in an Organized Health Care Education/Training Program | Primary: Student in an Organized Health Care Education/Training Program

## 2022-04-13 ENCOUNTER — Ambulatory Visit: Admit: 2022-04-13 | Payer: MEDICARE

## 2022-04-18 ENCOUNTER — Ambulatory Visit
Admit: 2022-04-18 | Discharge: 2022-04-19 | Payer: MEDICARE | Attending: Student in an Organized Health Care Education/Training Program | Primary: Student in an Organized Health Care Education/Training Program

## 2022-04-18 DIAGNOSIS — I2510 Atherosclerotic heart disease of native coronary artery without angina pectoris: Secondary | ICD-10-CM | POA: Diagnosis not present

## 2022-04-18 DIAGNOSIS — Z87828 Personal history of other (healed) physical injury and trauma: Secondary | ICD-10-CM | POA: Diagnosis not present

## 2022-04-18 DIAGNOSIS — Z09 Encounter for follow-up examination after completed treatment for conditions other than malignant neoplasm: Secondary | ICD-10-CM | POA: Diagnosis not present

## 2022-04-18 DIAGNOSIS — E119 Type 2 diabetes mellitus without complications: Secondary | ICD-10-CM | POA: Diagnosis not present

## 2022-04-18 DIAGNOSIS — R0683 Snoring: Secondary | ICD-10-CM | POA: Diagnosis not present

## 2022-04-18 DIAGNOSIS — R69 Illness, unspecified: Secondary | ICD-10-CM | POA: Diagnosis not present

## 2022-04-18 DIAGNOSIS — F250 Schizoaffective disorder, bipolar type: Secondary | ICD-10-CM | POA: Diagnosis not present

## 2022-04-18 DIAGNOSIS — F17200 Nicotine dependence, unspecified, uncomplicated: Secondary | ICD-10-CM | POA: Diagnosis not present

## 2022-04-18 DIAGNOSIS — R0600 Dyspnea, unspecified: Secondary | ICD-10-CM | POA: Diagnosis not present

## 2022-04-18 DIAGNOSIS — L97529 Non-pressure chronic ulcer of other part of left foot with unspecified severity: Secondary | ICD-10-CM | POA: Diagnosis not present

## 2022-04-18 DIAGNOSIS — I4729 Other ventricular tachycardia: Secondary | ICD-10-CM | POA: Diagnosis not present

## 2022-04-18 DIAGNOSIS — I10 Essential (primary) hypertension: Secondary | ICD-10-CM | POA: Diagnosis not present

## 2022-04-18 DIAGNOSIS — Z23 Encounter for immunization: Secondary | ICD-10-CM | POA: Diagnosis not present

## 2022-04-18 MED ORDER — INVEGA SUSTENNA 156 MG/ML INTRAMUSCULAR SYRINGE
INTRAMUSCULAR | 11 refills | 28 days | Status: CP
Start: 2022-04-18 — End: ?
  Filled 2022-06-03: qty 1, 28d supply, fill #0

## 2022-04-18 MED ORDER — LANCETS 33 GAUGE
3 refills | 0 days | Status: CP
Start: 2022-04-18 — End: ?
  Filled 2022-04-20: qty 100, 100d supply, fill #0

## 2022-04-18 MED ORDER — BLOOD-GLUCOSE METER
11 refills | 0 days | Status: CP
Start: 2022-04-18 — End: ?
  Filled 2022-04-20: qty 1, 90d supply, fill #0

## 2022-04-18 MED ORDER — ONETOUCH VERIO TEST STRIPS
3 refills | 0 days | Status: CP
Start: 2022-04-18 — End: ?
  Filled 2022-04-20: qty 100, 100d supply, fill #0

## 2022-04-25 DIAGNOSIS — E878 Other disorders of electrolyte and fluid balance, not elsewhere classified: Principal | ICD-10-CM

## 2022-04-27 ENCOUNTER — Ambulatory Visit: Admit: 2022-04-27 | Discharge: 2022-04-27 | Payer: MEDICARE

## 2022-04-27 DIAGNOSIS — E878 Other disorders of electrolyte and fluid balance, not elsewhere classified: Secondary | ICD-10-CM | POA: Diagnosis not present

## 2022-05-09 ENCOUNTER — Ambulatory Visit: Admit: 2022-05-09 | Discharge: 2022-05-10 | Payer: MEDICARE

## 2022-05-09 DIAGNOSIS — Z79899 Other long term (current) drug therapy: Secondary | ICD-10-CM | POA: Diagnosis not present

## 2022-05-09 DIAGNOSIS — F250 Schizoaffective disorder, bipolar type: Secondary | ICD-10-CM | POA: Diagnosis not present

## 2022-05-09 DIAGNOSIS — R69 Illness, unspecified: Secondary | ICD-10-CM | POA: Diagnosis not present

## 2022-05-09 MED ORDER — ARIPIPRAZOLE 15 MG TABLET
ORAL_TABLET | Freq: Every day | ORAL | 3 refills | 90 days | Status: CP
Start: 2022-05-09 — End: 2023-05-04

## 2022-05-09 MED ORDER — DIVALPROEX ER 500 MG TABLET,EXTENDED RELEASE 24 HR
ORAL_TABLET | Freq: Every day | ORAL | 3 refills | 90 days | Status: CP
Start: 2022-05-09 — End: ?

## 2022-05-18 DIAGNOSIS — I10 Essential (primary) hypertension: Principal | ICD-10-CM

## 2022-05-18 NOTE — Unmapped (Signed)
[facility] Specialty Medication Onboarding    Specialty Medication: [name] 156mg /mL syringe  Prior Authorization: Not Required   Financial Assistance: No - copay  <$25  Final Copay/Day Supply: $0 / 28 days    Insurance Restrictions: None     Notes to Pharmacist: Deliver to clinic. Contact or delivery at [phone] or via Epic chat--[place], CMA, or [employer], CMA, for [facility], [address], [place] location. Contact # [phone].    The triage team has completed the benefits investigation and has determined that the patient is able to fill this medication at [facility]. Please contact the patient to complete the onboarding or follow up with the prescribing physician as needed.

## 2022-05-18 NOTE — Unmapped (Signed)
This patient is receiving [name] as a clinic administered medication. Pharmacist reviewed the prescription and the patient's chart and determined that therapy is appropriate.  Patient is: not contacted as copay is $0. All future communication to occur between the [name] and the clinic. This patient is being disenrolled from our specialty management program and will be added to a patient list for appropriate follow up.        NAME:Adrian Robinson DOB: September 25, 1975      Medication: [name]    Clinic contact: Loleta Dicker, CMA    Patient's next nurse visit for administration: 06/08/22.       Camillo Flaming  Specialty Pharmacy Pharmacist

## 2022-06-02 NOTE — Unmapped (Signed)
[facility] Specialty Pharmacy Clinic Administered Medication Refill Coordination Note      NAME:Adrian Robinson DOB: [birth year]      Medication: [name]    Day Supply: 28 days      SHIPPING      Next delivery from [facility] [phone]) to  [facility]  [name] is scheduled for 06/03/22.    Clinic contact: Raden de Art Buff Paylor    Patient's next nurse visit for administration: 06/08/22.    We will follow up with clinic monthly for standard refill processing and delivery.      Arnold Long, PharmD  Specialty Pharmacy Pharmacist

## 2022-06-06 MED ORDER — INVEGA SUSTENNA 156 MG/ML INTRAMUSCULAR SYRINGE
INTRAMUSCULAR | 2 refills | 28 days | Status: CP
Start: 2022-06-06 — End: 2022-09-04
  Filled 2022-08-25: qty 1, 28d supply, fill #0

## 2022-06-08 ENCOUNTER — Institutional Professional Consult (permissible substitution)
Admit: 2022-06-08 | Discharge: 2022-06-09 | Payer: MEDICARE | Attending: Geriatric Psychiatry | Primary: Geriatric Psychiatry

## 2022-06-08 DIAGNOSIS — F250 Schizoaffective disorder, bipolar type: Secondary | ICD-10-CM | POA: Diagnosis not present

## 2022-06-08 DIAGNOSIS — R69 Illness, unspecified: Secondary | ICD-10-CM | POA: Diagnosis not present

## 2022-06-08 MED ADMIN — paliperidone palmitate (INVEGA SUSTENNA) 156 mg/mL injection 156 mg: 156 mg | INTRAMUSCULAR | @ 17:00:00 | Stop: 2023-04-10

## 2022-06-08 NOTE — Unmapped (Signed)
Administered long-acting injectable today, 06/08/2022. See MAR for additional documentation. Chrysa Rampy J De freitas, CMA

## 2022-06-13 ENCOUNTER — Ambulatory Visit: Admit: 2022-06-13 | Discharge: 2022-06-14 | Payer: MEDICARE

## 2022-06-13 DIAGNOSIS — F250 Schizoaffective disorder, bipolar type: Principal | ICD-10-CM

## 2022-06-13 MED ORDER — NICOTINE 21 MG/24 HR DAILY TRANSDERMAL PATCH
MEDICATED_PATCH | TRANSDERMAL | 2 refills | 28 days | Status: CP
Start: 2022-06-13 — End: ?

## 2022-06-13 MED ORDER — NICOTINE (POLACRILEX) 2 MG BUCCAL LOZENGE
BUCCAL | 2 refills | 9 days | Status: CP | PRN
Start: 2022-06-13 — End: 2022-08-12

## 2022-06-13 NOTE — Unmapped (Signed)
Labs were not collected today.
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
## 2022-06-17 ENCOUNTER — Emergency Department: Admit: 2022-06-17 | Discharge: 2022-06-18 | Disposition: A | Discharge status: Home / Self Care | Payer: MEDICARE

## 2022-06-17 ENCOUNTER — Ambulatory Visit: Admit: 2022-06-17 | Discharge: 2022-06-18 | Disposition: A | Discharge status: Home / Self Care | Payer: MEDICARE

## 2022-06-17 DIAGNOSIS — R109 Unspecified abdominal pain: Secondary | ICD-10-CM | POA: Diagnosis not present

## 2022-06-17 DIAGNOSIS — F250 Schizoaffective disorder, bipolar type: Secondary | ICD-10-CM | POA: Diagnosis not present

## 2022-06-17 DIAGNOSIS — Z20822 Contact with and (suspected) exposure to covid-19: Secondary | ICD-10-CM | POA: Diagnosis not present

## 2022-06-17 DIAGNOSIS — Z79899 Other long term (current) drug therapy: Secondary | ICD-10-CM | POA: Diagnosis not present

## 2022-06-17 DIAGNOSIS — I4510 Unspecified right bundle-branch block: Secondary | ICD-10-CM | POA: Diagnosis not present

## 2022-06-17 DIAGNOSIS — Z7984 Long term (current) use of oral hypoglycemic drugs: Secondary | ICD-10-CM | POA: Diagnosis not present

## 2022-06-17 DIAGNOSIS — I2510 Atherosclerotic heart disease of native coronary artery without angina pectoris: Secondary | ICD-10-CM | POA: Diagnosis not present

## 2022-06-17 DIAGNOSIS — R1031 Right lower quadrant pain: Secondary | ICD-10-CM | POA: Diagnosis not present

## 2022-06-17 DIAGNOSIS — R1032 Left lower quadrant pain: Secondary | ICD-10-CM | POA: Diagnosis not present

## 2022-06-17 DIAGNOSIS — N289 Disorder of kidney and ureter, unspecified: Secondary | ICD-10-CM | POA: Diagnosis not present

## 2022-06-17 DIAGNOSIS — E119 Type 2 diabetes mellitus without complications: Secondary | ICD-10-CM | POA: Diagnosis not present

## 2022-06-17 DIAGNOSIS — R69 Illness, unspecified: Secondary | ICD-10-CM | POA: Diagnosis not present

## 2022-06-17 DIAGNOSIS — I10 Essential (primary) hypertension: Secondary | ICD-10-CM | POA: Diagnosis not present

## 2022-06-17 DIAGNOSIS — J45909 Unspecified asthma, uncomplicated: Secondary | ICD-10-CM | POA: Diagnosis not present

## 2022-06-17 DIAGNOSIS — R9431 Abnormal electrocardiogram [ECG] [EKG]: Secondary | ICD-10-CM | POA: Diagnosis not present

## 2022-06-17 LAB — COMPREHENSIVE METABOLIC PANEL
ALBUMIN: 3.5 g/dL (ref 3.4–5.0)
ALKALINE PHOSPHATASE: 64 U/L (ref 46–116)
ALT (SGPT): 19 U/L (ref 10–49)
ANION GAP: 8 mmol/L (ref 5–14)
AST (SGOT): 16 U/L (ref <=34)
BILIRUBIN TOTAL: 0.5 mg/dL (ref 0.3–1.2)
BLOOD UREA NITROGEN: 10 mg/dL (ref 9–23)
BUN / CREAT RATIO: 8
CALCIUM: 9.4 mg/dL (ref 8.7–10.4)
CHLORIDE: 92 mmol/L — ABNORMAL LOW (ref 98–107)
CO2: 27.7 mmol/L (ref 20.0–31.0)
CREATININE: 1.19 mg/dL — ABNORMAL HIGH
EGFR CKD-EPI (2021) MALE: 75 mL/min/{1.73_m2} (ref >=60)
GLUCOSE RANDOM: 272 mg/dL — ABNORMAL HIGH (ref 70–179)
POTASSIUM: 4 mmol/L (ref 3.4–4.8)
PROTEIN TOTAL: 6.4 g/dL (ref 5.7–8.2)
SODIUM: 128 mmol/L — ABNORMAL LOW (ref 135–145)

## 2022-06-17 LAB — CBC W/ AUTO DIFF
BASOPHILS ABSOLUTE COUNT: 0.1 10*9/L (ref 0.0–0.1)
BASOPHILS RELATIVE PERCENT: 0.7 %
EOSINOPHILS ABSOLUTE COUNT: 0.2 10*9/L (ref 0.0–0.5)
EOSINOPHILS RELATIVE PERCENT: 1.7 %
HEMATOCRIT: 41.5 % (ref 39.0–48.0)
HEMOGLOBIN: 14.4 g/dL (ref 12.9–16.5)
LYMPHOCYTES ABSOLUTE COUNT: 2.7 10*9/L (ref 1.1–3.6)
LYMPHOCYTES RELATIVE PERCENT: 26.5 %
MEAN CORPUSCULAR HEMOGLOBIN CONC: 34.7 g/dL (ref 32.0–36.0)
MEAN CORPUSCULAR HEMOGLOBIN: 29.1 pg (ref 25.9–32.4)
MEAN CORPUSCULAR VOLUME: 83.9 fL (ref 77.6–95.7)
MEAN PLATELET VOLUME: 9.3 fL (ref 6.8–10.7)
MONOCYTES ABSOLUTE COUNT: 0.8 10*9/L (ref 0.3–0.8)
MONOCYTES RELATIVE PERCENT: 7.5 %
NEUTROPHILS ABSOLUTE COUNT: 6.4 10*9/L (ref 1.8–7.8)
NEUTROPHILS RELATIVE PERCENT: 63.6 %
NUCLEATED RED BLOOD CELLS: 0 /100{WBCs} (ref <=4)
PLATELET COUNT: 263 10*9/L (ref 150–450)
RED BLOOD CELL COUNT: 4.95 10*12/L (ref 4.26–5.60)
RED CELL DISTRIBUTION WIDTH: 13.7 % (ref 12.2–15.2)
WBC ADJUSTED: 10.1 10*9/L (ref 3.6–11.2)

## 2022-06-17 LAB — SLIDE REVIEW

## 2022-06-18 DIAGNOSIS — R109 Unspecified abdominal pain: Secondary | ICD-10-CM | POA: Diagnosis not present

## 2022-06-18 LAB — BASIC METABOLIC PANEL
ANION GAP: 4 mmol/L — ABNORMAL LOW (ref 5–14)
BLOOD UREA NITROGEN: 12 mg/dL (ref 9–23)
BUN / CREAT RATIO: 12
CALCIUM: 9.2 mg/dL (ref 8.7–10.4)
CHLORIDE: 97 mmol/L — ABNORMAL LOW (ref 98–107)
CO2: 32 mmol/L — ABNORMAL HIGH (ref 20.0–31.0)
CREATININE: 1.04 mg/dL
EGFR CKD-EPI (2021) MALE: 89 mL/min/{1.73_m2} (ref >=60)
GLUCOSE RANDOM: 189 mg/dL — ABNORMAL HIGH (ref 70–179)
POTASSIUM: 3.5 mmol/L (ref 3.4–4.8)
SODIUM: 133 mmol/L — ABNORMAL LOW (ref 135–145)

## 2022-06-18 LAB — URINALYSIS WITH MICROSCOPY WITH CULTURE REFLEX
BACTERIA: NONE SEEN /HPF
BILIRUBIN UA: NEGATIVE
BLOOD UA: NEGATIVE
GLUCOSE UA: 200 — AB
KETONES UA: NEGATIVE
LEUKOCYTE ESTERASE UA: NEGATIVE
NITRITE UA: NEGATIVE
PH UA: 7 (ref 5.0–9.0)
PROTEIN UA: NEGATIVE
RBC UA: 1 /HPF (ref <=3)
SPECIFIC GRAVITY UA: 1.004 (ref 1.003–1.030)
SQUAMOUS EPITHELIAL: <1 /HPF (ref 0–5)
UROBILINOGEN UA: <2
WBC UA: <1 /HPF (ref <=2)

## 2022-06-18 LAB — SODIUM, URINE, RANDOM: SODIUM URINE: 46 mmol/L

## 2022-06-18 LAB — LIPASE: LIPASE: 41 U/L (ref 12–53)

## 2022-06-18 LAB — CREATININE, URINE: CREATININE, URINE: 14 mg/dL

## 2022-06-18 LAB — HIGH SENSITIVITY TROPONIN I - SINGLE: HIGH SENSITIVITY TROPONIN I: 29 ng/L (ref <=53)

## 2022-06-18 MED ADMIN — diazePAM (VALIUM) tablet 5 mg: 5 mg | ORAL | @ 09:00:00 | Stop: 2022-06-18

## 2022-06-18 MED ADMIN — lidocaine 4 % patch 2 patch: 2 | TRANSDERMAL | @ 09:00:00 | Stop: 2022-06-18

## 2022-06-18 MED ADMIN — iohexoL (OMNIPAQUE) 350 mg iodine/mL solution 100 mL: 100 mL | INTRAVENOUS | @ 08:00:00 | Stop: 2022-06-18

## 2022-06-18 MED ADMIN — morphine 4 mg/mL injection 4 mg: 4 mg | INTRAVENOUS | @ 06:00:00 | Stop: 2022-06-18

## 2022-06-18 MED ADMIN — sodium chloride 0.9% (NS) bolus 1,000 mL: 1000 mL | INTRAVENOUS | @ 06:00:00 | Stop: 2022-06-18
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
## 2022-06-18 NOTE — Unmapped (Signed)
Pt reports pain to bilateral flank and dark urine for x 3 days. Denies any h/o kidney stones

## 2022-06-22 ENCOUNTER — Ambulatory Visit
Admit: 2022-06-22 | Discharge: 2022-06-23 | Payer: MEDICARE | Attending: Student in an Organized Health Care Education/Training Program | Primary: Student in an Organized Health Care Education/Training Program

## 2022-06-22 DIAGNOSIS — L97529 Non-pressure chronic ulcer of other part of left foot with unspecified severity: Secondary | ICD-10-CM | POA: Diagnosis not present

## 2022-06-22 DIAGNOSIS — H7101 Cholesteatoma of attic, right ear: Secondary | ICD-10-CM | POA: Diagnosis not present

## 2022-06-22 DIAGNOSIS — N2889 Other specified disorders of kidney and ureter: Secondary | ICD-10-CM | POA: Diagnosis not present

## 2022-06-22 DIAGNOSIS — E119 Type 2 diabetes mellitus without complications: Secondary | ICD-10-CM | POA: Diagnosis not present

## 2022-06-22 DIAGNOSIS — E871 Hypo-osmolality and hyponatremia: Secondary | ICD-10-CM | POA: Diagnosis not present

## 2022-06-22 NOTE — Unmapped (Addendum)
We are going to do blood work to check your sodium and kidney function.    Please try to drink no more than 4-5 bottles of water a day and use chap stick for dry lips.     I have ordered a CT scan of the kidney. You will be called by the scheduler.     I have placed a referral to the Ear Nose and Throat doctor.     I have placed a referral to the Wound clinic to look at your left foot.
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
## 2022-06-22 NOTE — Unmapped (Signed)
Been [facility] at [facility]     Type of visit: face to face    Are you located in [place]? (for virtual visits only) N/A    Reason for visit: Follow up    Questions / Concerns that need to be addressed: For over a week was having lower back pain. Need right ear checked had paper stock down in it.        Omron BPs (complete if screening BP has a systolic  > 130 or diastolic > 80)  BP#1 154/96 P 74   BP#2 143/92 P 73  BP#3 145/93 P 74    Average BP 147/93 P 74  (please note this as a comment in vitals)     PTHomeBP     Diabetes:  Regularly checking blood sugars?: no  If yes, when? Complete log for past 7 days  Date Before Breakfast After Breakfast Before Lunch After Lunch Before Dinner After Dinner Before Bed                                                                                                                                   HCDM reviewed and updated in Epic:    We are working to make sure all of our patients??? wishes are updated in Epic and part of that is documenting a Environmental health practitioner for each patient  A Health Care Decision Maker is someone you choose who can make health care decisions for you if you are not able - who would you most want to do this for you????  is already up to date.    HCDM (patient stated preference): Zada Girt - Mother - [phone]    BPAs completed:  N/A     COVID-19 Vaccine Summary  Which COVID-19 Vaccine was administered  Moderna  Type:  Dates Given:                   If no: Are you interested in scheduling? Declines vaccine    Immunization History   Administered Date(s) Administered    COVID-19 VACCINE,MRNA(MODERNA)(PF) 12/06/2019, 01/03/2020, 07/30/2020    INFLUENZA TIV (TRI) PF (IM) 05/08/2012    Influenza Vaccine Quad (IIV4 PF) 13mo+ injectable 06/24/2017, 06/23/2018, 04/16/2019, 04/18/2022    PNEUMOCOCCAL POLYSACCHARIDE 23-VALENT 01/30/2012, 02/15/2017, 06/24/2017    Pneumococcal Conjugate 20-valent 02/23/2021    Td (adult) unspecified formulation 10/14/2011    TdaP 10/14/2011, 02/23/2021       __________________________________________________________________________________________    SCREENINGS COMPLETED IN FLOWSHEETS    HARK Screening       AUDIT       PHQ2       PHQ9          P4 Suicidality Screener                GAD7       COPD Assessment       Falls Risk

## 2022-06-22 NOTE — Unmapped (Signed)
[facility] Administered Medication Refill Coordination Note      NAME:Adrian Robinson DOB: [birth year]      Medication: [name]    Day Supply: 28 days      SHIPPING      Next delivery from [facility] [phone]) to  [facility]   [name] is scheduled for 12/06.    Clinic contact: Raden deFrietas    Patient's next nurse visit for administration: 12/13.    We will follow up with clinic monthly for standard refill processing and delivery.      Janei Scheff Samella Parr  Specialty Pharmacy Technician

## 2022-06-23 DIAGNOSIS — E871 Hypo-osmolality and hyponatremia: Principal | ICD-10-CM

## 2022-06-23 NOTE — Unmapped (Signed)
Was unsuccessful in my attempt to call back patient instructing him to come to the lab to get BMP collected for evaluation of hyponatremia.  Will try to call back in the next 24 hours.    Plan  -Patient to have BMP collected at lab  -Review A1c and BMP with patient  -Will likely need adjustments to diabetes regimen as I suspect polydipsia is related to worsening diabetes

## 2022-06-29 MED FILL — INVEGA SUSTENNA 156 MG/ML INTRAMUSCULAR SYRINGE: INTRAMUSCULAR | 28 days supply | Qty: 1 | Fill #1

## 2022-07-04 DIAGNOSIS — E1100 Type 2 diabetes mellitus with hyperosmolarity without nonketotic hyperglycemic-hyperosmolar coma (NKHHC): Principal | ICD-10-CM

## 2022-07-04 MED ORDER — EMPAGLIFLOZIN 10 MG TABLET
ORAL_TABLET | Freq: Every morning | ORAL | 3 refills | 30 days | Status: CP
Start: 2022-07-04 — End: ?

## 2022-07-05 NOTE — Unmapped (Signed)
Called patient to discuss with him and his mother that I have called in new rx Jardiance for diabetes. They verbalized understanding and will pick up medication in the morning.

## 2022-07-05 NOTE — Unmapped (Signed)
See telephone encounter 12/11

## 2022-07-06 ENCOUNTER — Ambulatory Visit: Admit: 2022-07-06 | Payer: MEDICARE

## 2022-07-07 DIAGNOSIS — F250 Schizoaffective disorder, bipolar type: Principal | ICD-10-CM

## 2022-07-13 ENCOUNTER — Institutional Professional Consult (permissible substitution)
Admit: 2022-07-13 | Discharge: 2022-07-14 | Payer: MEDICARE | Attending: Geriatric Psychiatry | Primary: Geriatric Psychiatry

## 2022-07-13 DIAGNOSIS — R69 Illness, unspecified: Secondary | ICD-10-CM | POA: Diagnosis not present

## 2022-07-13 DIAGNOSIS — F250 Schizoaffective disorder, bipolar type: Secondary | ICD-10-CM | POA: Diagnosis not present

## 2022-07-13 MED ADMIN — paliperidone palmitate (INVEGA SUSTENNA) 156 mg/mL injection 156 mg: 156 mg | INTRAMUSCULAR | @ 19:00:00 | Stop: 2023-06-14

## 2022-07-13 NOTE — Unmapped (Signed)
P 

## 2022-07-13 NOTE — Unmapped (Signed)
[facility] Administered Medication Refill Coordination Note      NAME:Adrian Robinson DOB: 14-Oct-1975      Medication: [name]    Day Supply: 28 days      SHIPPING      Next delivery from [facility] [phone]) to [facility] for [name] is scheduled for 01/03.    Clinic contact: Raden deFrietas,     Patient's next nurse visit for administration: 01/08.    We will follow up with clinic monthly for standard refill processing and delivery.      Abdulloh Ullom Samella Parr  Specialty Pharmacy Technician

## 2022-07-27 MED FILL — INVEGA SUSTENNA 156 MG/ML INTRAMUSCULAR SYRINGE: INTRAMUSCULAR | 28 days supply | Qty: 1 | Fill #2
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
## 2022-08-01 ENCOUNTER — Ambulatory Visit: Admit: 2022-08-01 | Discharge: 2022-08-02 | Payer: MEDICARE

## 2022-08-01 DIAGNOSIS — Z720 Tobacco use: Secondary | ICD-10-CM | POA: Diagnosis not present

## 2022-08-01 DIAGNOSIS — F250 Schizoaffective disorder, bipolar type: Secondary | ICD-10-CM | POA: Diagnosis not present

## 2022-08-01 DIAGNOSIS — R69 Illness, unspecified: Secondary | ICD-10-CM | POA: Diagnosis not present

## 2022-08-01 DIAGNOSIS — Z79899 Other long term (current) drug therapy: Secondary | ICD-10-CM | POA: Diagnosis not present

## 2022-08-01 LAB — VALPROIC ACID LEVEL, TOTAL: VALPROIC ACID TOTAL: 57.1 ug/mL (ref 50.0–100.0)

## 2022-08-01 NOTE — Unmapped (Signed)
Follow-up instructions:  -- Please continue taking your medications as prescribed for your mental health.   -- Do not make changes to your medications, including taking more or less than prescribed, unless under the supervision of your physician. Be aware that some medications may make you feel worse if abruptly stopped  -- Please refrain from using illicit substances, as these can affect your mood and could cause anxiety or other concerning symptoms.   -- Seek further medical care for any increase in symptoms or new symptoms such as thoughts of wanting to hurt yourself or hurt others.     Contact info:  Life-threatening emergencies: Call 911, the 988 suicide and crisis lifeline, or go to the nearest ER for medical or psychiatric attention.     Issues that need urgent attention but are not life threatening: Call the clinic outpatient front desk for assistance. [phone] for our [name] clinic located at [address] [facility]. [phone] for our [facility] STEP clinic.    Non-urgent routine concerns and questions: Send a message through [facility] or call our clinic front desk.    Refill requests: Check with your pharmacy to initiate refill requests.    Regarding appointments:  - If you need to cancel your appointment, we ask that you call your clinic at least 24 hours before your scheduled appointment. [phone] for our [facility] clinic located at [address] [facility]. [phone] for our [facility] STEP clinic.  - If for any reason you arrive 15 minutes later than your scheduled appointment time, you may not be seen and your visit may be rescheduled.  - Please remember that we will not automatically reschedule missed appointments.  - If you miss two (2) appointments without letting us know in advance, you will likely be referred to a provider in your community.  - We will do our best to be on time. Sometimes an emergency will arise that might cause your clinician to be late. We will try to inform you of this when you check in for your appointment. If you wait more than 15 minutes past your appointment time without such notice, please speak with the front desk staff.    In the event of bad weather, the clinic staff will attempt to contact you, should your appointment need to be rescheduled. Additionally, you can call the Patient Weather Line [phone] for system-wide clinic status    For more information and reminders regarding clinic policies (these were provided when you were admitted to the clinic), please ask the front desk.

## 2022-08-01 NOTE — Unmapped (Signed)
Blood for lab tests collected as ordered. Venipuncture attempt x 1, pt tolerated well.

## 2022-08-02 NOTE — Unmapped (Signed)
Addended by: Ulla Potash L on: 08/01/2022 04:25 PM     Modules accepted: Level of Service

## 2022-08-10 ENCOUNTER — Institutional Professional Consult (permissible substitution)
Admit: 2022-08-10 | Discharge: 2022-08-11 | Payer: MEDICARE | Attending: Student in an Organized Health Care Education/Training Program | Primary: Student in an Organized Health Care Education/Training Program

## 2022-08-10 DIAGNOSIS — R69 Illness, unspecified: Secondary | ICD-10-CM | POA: Diagnosis not present

## 2022-08-10 DIAGNOSIS — F250 Schizoaffective disorder, bipolar type: Secondary | ICD-10-CM | POA: Diagnosis not present

## 2022-08-10 MED ADMIN — paliperidone palmitate (INVEGA SUSTENNA) 156 mg/mL injection 156 mg: 156 mg | INTRAMUSCULAR | @ 19:00:00 | Stop: 2023-06-14

## 2022-08-10 NOTE — Unmapped (Signed)
Administered long-acting injectable today, 08/10/2022. See MAR for additional documentation. Adrian Robinson

## 2022-08-16 ENCOUNTER — Ambulatory Visit
Admit: 2022-08-16 | Discharge: 2022-08-17 | Payer: MEDICARE | Attending: Student in an Organized Health Care Education/Training Program | Primary: Student in an Organized Health Care Education/Training Program

## 2022-08-16 ENCOUNTER — Telehealth: Payer: Self-pay | Admitting: Family Medicine

## 2022-08-16 DIAGNOSIS — E871 Hypo-osmolality and hyponatremia: Secondary | ICD-10-CM | POA: Diagnosis not present

## 2022-08-16 DIAGNOSIS — H7101 Cholesteatoma of attic, right ear: Secondary | ICD-10-CM | POA: Diagnosis not present

## 2022-08-16 DIAGNOSIS — E1100 Type 2 diabetes mellitus with hyperosmolarity without nonketotic hyperglycemic-hyperosmolar coma (NKHHC): Secondary | ICD-10-CM | POA: Diagnosis not present

## 2022-08-16 DIAGNOSIS — L97521 Non-pressure chronic ulcer of other part of left foot limited to breakdown of skin: Secondary | ICD-10-CM | POA: Diagnosis not present

## 2022-08-16 DIAGNOSIS — N2889 Other specified disorders of kidney and ureter: Secondary | ICD-10-CM | POA: Diagnosis not present

## 2022-08-16 LAB — BASIC METABOLIC PANEL
ANION GAP: 4 mmol/L — ABNORMAL LOW (ref 5–14)
BLOOD UREA NITROGEN: 11 mg/dL (ref 9–23)
BUN / CREAT RATIO: 10
CALCIUM: 9.3 mg/dL (ref 8.7–10.4)
CHLORIDE: 102 mmol/L (ref 98–107)
CO2: 28.4 mmol/L (ref 20.0–31.0)
CREATININE: 1.13 mg/dL
EGFR CKD-EPI (2021) MALE: 80 mL/min/{1.73_m2} (ref >=60)
GLUCOSE RANDOM: 147 mg/dL (ref 70–179)
POTASSIUM: 3.9 mmol/L (ref 3.4–4.8)
SODIUM: 134 mmol/L — ABNORMAL LOW (ref 135–145)

## 2022-08-16 NOTE — Telephone Encounter (Signed)
Phone call to Dr. Jimmye Norman. Dr states that since phone call was placed to [facility], he has consulted with other colleagues and ID. Pt to follow-up with Dr. Jimmye Norman. No further follow-up or appt needed at [facility] at this time.

## 2022-08-16 NOTE — Telephone Encounter (Signed)
Dr. Oliva Bustard From [facility] called to report that the patient has a Non Occupational Needle Stick. He is worried about his patient being able to make it back and forth to [facility] for care and is seeking help in getting him connected for after care here in [facility] Cherly Beach has referred me to you.  Dr. Lenon Ahmadi he is going to complete blood work and labs and is awaiting a phone call on his cell phone because he will not be in clinic this afternoon. Cell# [phone]
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
## 2022-08-16 NOTE — Unmapped (Addendum)
We will check your blood work today.     I have contacted [place] [facility] dept to get you evaluated for the needle stick exposure if needed.    I will see you back in 1 month to go over the rest of your health.

## 2022-08-16 NOTE — Unmapped (Signed)
[facility]     Type of visit: face to face    Are you located in [place]? (for virtual visits only) N/A    Reason for visit: Follow up    Questions / Concerns that need to be addressed: None        Omron BPs (complete if screening BP has a systolic  > 130 or diastolic > 80)  BP#1 159/100 P 76  BP#2 160/97 P 77  BP#3 150/103 P 70    Average BP 156/102 P74   (please note this as a comment in vitals)     PTHomeBP     Diabetes:  Regularly checking blood sugars?: no  If yes, when? Complete log for past 7 days  Date Before Breakfast After Breakfast Before Lunch After Lunch Before Dinner After Dinner Before Bed                                                                                                                                   HCDM reviewed and updated in Epic:    We are working to make sure all of our patients??? wishes are updated in Epic and part of that is documenting a Environmental health practitioner for each patient  A Health Care Decision Maker is someone you choose who can make health care decisions for you if you are not able - who would you most want to do this for you????  is already up to date.    HCDM (patient stated preference): Zada Girt - Mother - [phone]    BPAs completed:  N/A     COVID-19 Vaccine Summary  Which COVID-19 Vaccine was administered  Moderna  Type:  Dates Given:                   If no: Are you interested in scheduling? Declines vaccine    Immunization History   Administered Date(s) Administered    COVID-19 VACCINE,MRNA(MODERNA)(PF) 12/06/2019, 01/03/2020, 07/30/2020    INFLUENZA TIV (TRI) PF (IM) 05/08/2012    Influenza Vaccine Quad(IM)6 MO-Adult(PF) 06/24/2017, 06/23/2018, 04/16/2019, 04/18/2022    PNEUMOCOCCAL POLYSACCHARIDE 23-VALENT 01/30/2012, 02/15/2017, 06/24/2017    Pneumococcal Conjugate 20-valent 02/23/2021    Td (adult) unspecified formulation 10/14/2011    TdaP 10/14/2011, 02/23/2021 __________________________________________________________________________________________    SCREENINGS COMPLETED IN FLOWSHEETS    HARK Screening       AUDIT       PHQ2       PHQ9          P4 Suicidality Screener                GAD7       COPD Assessment       Falls Risk

## 2022-08-17 NOTE — Unmapped (Signed)
[facility] Administered Medication Refill Coordination Note      NAME:Adrian Robinson DOB: January 06, 1976      Medication: [name]    Day Supply: 28 days      SHIPPING      Next delivery from [facility] [phone]) to [facility] [name] is scheduled for 02/01.    Clinic contact: Raden deFrietas    Patient's next nurse visit for administration: 02/05.    We will follow up with clinic monthly for standard refill processing and delivery.      Adrian Robinson  Specialty Pharmacy Technician
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
## 2022-09-07 ENCOUNTER — Institutional Professional Consult (permissible substitution)
Admit: 2022-09-07 | Discharge: 2022-09-08 | Payer: MEDICARE | Attending: Student in an Organized Health Care Education/Training Program | Primary: Student in an Organized Health Care Education/Training Program

## 2022-09-07 DIAGNOSIS — R69 Illness, unspecified: Secondary | ICD-10-CM | POA: Diagnosis not present

## 2022-09-07 DIAGNOSIS — F250 Schizoaffective disorder, bipolar type: Secondary | ICD-10-CM | POA: Diagnosis not present

## 2022-09-07 MED ADMIN — paliperidone palmitate (INVEGA SUSTENNA) 156 mg/mL injection 156 mg: 156 mg | INTRAMUSCULAR | @ 19:00:00 | Stop: 2023-06-14

## 2022-09-07 NOTE — Unmapped (Signed)
Administered long-acting injectable today, 09/07/2022. See MAR for additional documentation. Conception Oms
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
## 2022-09-14 ENCOUNTER — Ambulatory Visit
Admit: 2022-09-14 | Payer: MEDICARE | Attending: Student in an Organized Health Care Education/Training Program | Primary: Student in an Organized Health Care Education/Training Program

## 2022-09-14 NOTE — Unmapped (Unsigned)
Internal Medicine Clinic Visit    Reason for visit: ***    A/P:    {TIP - [facility]- [facility] Pilot- Clinical Documentation Specialist Recommendations-  No specialty comments available.   This text will self delete upon signing note:[id number]}     No diagnosis found.    There are no diagnoses linked to this encounter.     Hyponatremia  T2DM  Mild. Chronic. Ddx polydipsia 2/2 hyperglycemia vs psychogenic vs drug side effects. Appreciate collaboration with Dr. Hilton Cork of psychiatry. There was concerned for supratherapeutic depakote; however levels are within goal. Today mentation does not seem slowed from baseline. Denies nausea. Will check BMP today.    -     Basic Metabolic Panel  -     POCT Glycated Hemoglobin (HGB A1c)  -     Continue jardiance and glipizide     Exposure to needle, initial encounter  Physical exam notable for evidence of needle stick on R foot bottom. No evidence of infection. Patient shares he disinfected with rubbing alcohol and hydrogen peroxide. Suspect needle stick from prior invega needle that he takes for schizoaffective disorder. Not attached to a syringe. This is a nonoccupational needle stick exposure and I have low concern for IV drug use. Reviewed risk of HIV transmission being very low and can check hepatitis serologies at upcoming visit in 1 month. Patient is uptodate on Tdap 02/23/21.   - Return to clinic in 1 month to check HIV and hepatitis serologies  - Return precautions given     Renal mass, right  Not addressed today. Incidental 2 cm renal mass on CT imaging during evaluation for abdominal pain.  Will order dedicated study for further evaluation  -     CT Renal Mass; Future -> follow up scheduling study at next appt      Cholesteatoma of attic of right ear  Not addressed today. Discuss at next appt.   -     Ambulatory referral to ENT; Future     Ulcer of left foot, limited to breakdown of skin (CMS-HCC)  Patient has not been able to establish with wound care. Fortunately physical exam today notable for resolution of L foot ulcer.   - Discussed importance of proper shoes when outdoors  {TIP - [facility]- [facility] Pilot- Clinical Documentation Specialist Recommendations-  No specialty comments available.   This text will self delete upon signing note:[id number]}     No orders of the defined types were placed in this encounter.      No follow-ups on file.    Seen by Dr. Mayford Knife     __________________________________________________________    HPI:    T2DM. A1c 8.7% last 1/23 which is improved from 2 months prior.   __________________________________________________________    Problem List:  Patient Active Problem List   Diagnosis    Sinus disease    Mild intermittent asthma without complication    Schizoaffective disorder, bipolar type by history    Conductive hearing loss, middle ear    Type 2 diabetes mellitus, without long-term current use of insulin (CMS-HCC)    Hypertension    Tobacco use disorder    Decreased vision    Dyslipidemia    Myocardial infarction type 2 (CMS-HCC)    Achilles tendinitis of left lower extremity    Insomnia    History of head injury related to suicide attempt via [id number]-2002    r/o Cognitive disorder due to head injury sustained after suicide attempt    Headache  Dyspnea    Coronary artery disease    NSVT (nonsustained ventricular tachycardia) (CMS-HCC)    Hospital discharge follow-up    Ulcer of left foot (CMS-HCC)    Snoring       Medications:  Reviewed in EPIC  __________________________________________________________    Physical Exam:   Vital Signs:  There were no vitals filed for this visit.       Gen: Well appearing, NAD  CV: RRR, no murmurs  Pulm: CTA bilaterally, no crackles or wheezes  Abd: Soft, NTND  Ext: No edema      PHQ-9 Score:     GAD-7 Score:       Medication adherence and barriers to the treatment plan have been addressed. Opportunities to optimize healthy behaviors have been discussed. Patient / caregiver voiced understanding.      I personally spent *** minutes face-to-face and non-face-to-face in the care of this patient, which includes all pre, intra, and post visit time on the date of service.  All documented time was specific to the E/M visit and does not include any procedures that may have been performed.

## 2022-09-15 NOTE — Unmapped (Signed)
[facility] Administered Medication Refill Coordination Note      NAME:Sheryl Haiven Bartram DOB: [birth year]      Medication: [name]    Day Supply: 28 days      SHIPPING      Next delivery from [facility] [phone]) to [facility] for Kristoffer Skold is scheduled for 02/29.    Clinic contact: Raden deFrietas    Patient's next nurse visit for administration: 03/12.    We will follow up with clinic monthly for standard refill processing and delivery.      Jenita Rayfield Samella Parr  Specialty Pharmacy Technician
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
## 2022-09-19 ENCOUNTER — Ambulatory Visit: Admit: 2022-09-19 | Payer: MEDICARE

## 2022-09-22 NOTE — Unmapped (Signed)
Scheduled appt.

## 2022-09-23 MED FILL — INVEGA SUSTENNA 156 MG/ML INTRAMUSCULAR SYRINGE: INTRAMUSCULAR | 28 days supply | Qty: 1 | Fill #1

## 2022-09-28 ENCOUNTER — Ambulatory Visit
Admit: 2022-09-28 | Discharge: 2022-09-29 | Payer: MEDICARE | Attending: Addiction (Substance Use Disorder) | Primary: Addiction (Substance Use Disorder)

## 2022-09-28 DIAGNOSIS — R69 Illness, unspecified: Secondary | ICD-10-CM | POA: Diagnosis not present

## 2022-09-28 DIAGNOSIS — F250 Schizoaffective disorder, bipolar type: Secondary | ICD-10-CM | POA: Diagnosis not present
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
## 2022-10-04 ENCOUNTER — Ambulatory Visit: Admit: 2022-10-04 | Payer: MEDICARE

## 2022-10-05 ENCOUNTER — Ambulatory Visit
Admit: 2022-10-05 | Discharge: 2022-10-05 | Payer: MEDICARE | Attending: Addiction (Substance Use Disorder) | Primary: Addiction (Substance Use Disorder)

## 2022-10-05 ENCOUNTER — Institutional Professional Consult (permissible substitution)
Admit: 2022-10-05 | Discharge: 2022-10-05 | Payer: MEDICARE | Attending: Student in an Organized Health Care Education/Training Program | Primary: Student in an Organized Health Care Education/Training Program

## 2022-10-05 DIAGNOSIS — F250 Schizoaffective disorder, bipolar type: Secondary | ICD-10-CM | POA: Diagnosis not present

## 2022-10-05 DIAGNOSIS — R69 Illness, unspecified: Secondary | ICD-10-CM | POA: Diagnosis not present

## 2022-10-05 MED ORDER — ALBUTEROL SULFATE HFA 90 MCG/ACTUATION AEROSOL INHALER
3 refills | 0 days | Status: CP
Start: 2022-10-05 — End: ?

## 2022-10-05 MED ADMIN — paliperidone palmitate (INVEGA SUSTENNA) 156 mg/mL injection 156 mg: 156 mg | INTRAMUSCULAR | @ 16:00:00 | Stop: 2023-06-14
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
## 2022-10-05 NOTE — Unmapped (Signed)
Administered long-acting injectable today, 10/05/2022. See MAR for additional documentation. Anissa Abbs Cannon Kettle freitas, CMA

## 2022-10-12 ENCOUNTER — Ambulatory Visit
Admit: 2022-10-12 | Discharge: 2022-10-13 | Payer: MEDICARE | Attending: Addiction (Substance Use Disorder) | Primary: Addiction (Substance Use Disorder)

## 2022-10-12 DIAGNOSIS — F250 Schizoaffective disorder, bipolar type: Secondary | ICD-10-CM | POA: Diagnosis not present

## 2022-10-12 DIAGNOSIS — R69 Illness, unspecified: Secondary | ICD-10-CM | POA: Diagnosis not present
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
## 2022-10-14 NOTE — Unmapped (Signed)
A user error has taken place: encounter opened in error, closed for administrative reasons.

## 2022-10-14 NOTE — Unmapped (Signed)
[facility] Administered Medication Refill Coordination Note      NAME:Adrian Robinson DOB: 1976/07/16      Medication: [name]    Day Supply: 28 days      SHIPPING      Next delivery from [facility] [phone]) to [facility] [name] is scheduled for 03/25.    Clinic contact: Raden deFrietas    Patient's next nurse visit for administration: 03/27.    We will follow up with clinic monthly for standard refill processing and delivery.      Leatha Rohner Samella Parr  Specialty Pharmacy Technician

## 2022-10-17 MED FILL — INVEGA SUSTENNA 156 MG/ML INTRAMUSCULAR SYRINGE: INTRAMUSCULAR | 28 days supply | Qty: 1 | Fill #2

## 2022-10-19 ENCOUNTER — Ambulatory Visit
Admit: 2022-10-19 | Payer: MEDICARE | Attending: Addiction (Substance Use Disorder) | Primary: Addiction (Substance Use Disorder)
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
## 2022-10-26 ENCOUNTER — Ambulatory Visit
Admit: 2022-10-26 | Discharge: 2022-10-27 | Payer: MEDICARE | Attending: Addiction (Substance Use Disorder) | Primary: Addiction (Substance Use Disorder)

## 2022-10-26 DIAGNOSIS — R69 Illness, unspecified: Secondary | ICD-10-CM | POA: Diagnosis not present

## 2022-10-26 DIAGNOSIS — F250 Schizoaffective disorder, bipolar type: Secondary | ICD-10-CM | POA: Diagnosis not present
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
## 2022-10-31 ENCOUNTER — Ambulatory Visit: Admit: 2022-10-31 | Payer: MEDICARE

## 2022-11-02 ENCOUNTER — Ambulatory Visit
Admit: 2022-11-02 | Discharge: 2022-11-02 | Payer: MEDICARE | Attending: Addiction (Substance Use Disorder) | Primary: Addiction (Substance Use Disorder)

## 2022-11-02 ENCOUNTER — Institutional Professional Consult (permissible substitution)
Admit: 2022-11-02 | Discharge: 2022-11-02 | Payer: MEDICARE | Attending: Geriatric Psychiatry | Primary: Geriatric Psychiatry

## 2022-11-02 DIAGNOSIS — R69 Illness, unspecified: Secondary | ICD-10-CM | POA: Diagnosis not present

## 2022-11-02 DIAGNOSIS — F250 Schizoaffective disorder, bipolar type: Secondary | ICD-10-CM | POA: Diagnosis not present

## 2022-11-02 MED ADMIN — paliperidone palmitate (INVEGA SUSTENNA) 156 mg/mL injection 156 mg: 156 mg | INTRAMUSCULAR | @ 19:00:00 | Stop: 2023-06-14

## 2022-11-02 NOTE — Unmapped (Signed)
Administered long-acting injectable today, 11/02/2022. See MAR for additional documentation. Tericka Devincenzi J De freitas, CMA

## 2022-11-02 NOTE — Unmapped (Signed)
[facility] Administered Medication Refill Coordination Note      NAME:Adrian Robinson DOB: 04-26-1976      Medication: [name]    Day Supply: 28 days      SHIPPING      Next delivery from [facility] [phone]) to [facility] [name] is scheduled for 04/16.    Clinic contact: Raden deFrietas    Patient's next nurse visit for administration: unsure.    We will follow up with clinic monthly for standard refill processing and delivery.      Sundi Slevin Samella Parr  Specialty Pharmacy Technician
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
## 2022-11-08 MED FILL — INVEGA SUSTENNA 156 MG/ML INTRAMUSCULAR SYRINGE: INTRAMUSCULAR | 28 days supply | Qty: 1 | Fill #3

## 2022-11-08 NOTE — Unmapped (Signed)
Case Management Progress Note  [facility]       Start Time: 1312      End Time: 1316    Duration: 4 minutes              Date of Service:  11/08/2022      Service: Case Management - collateral    Purpose of contact:        To connect to ACTT services in [place]    Additional Information/Plan:  CM called [facility] in [place] to inquire about their capacity to serve a Pt without Medicaid. CM spoke with program assistant Julienne Kass who emailed him a referral form.    Roney Mans, MSW, LCSW  Case Manager - [facility] STEP [facility]   [phone]

## 2022-11-09 ENCOUNTER — Ambulatory Visit
Admit: 2022-11-09 | Discharge: 2022-11-10 | Payer: MEDICARE | Attending: Addiction (Substance Use Disorder) | Primary: Addiction (Substance Use Disorder)

## 2022-11-09 DIAGNOSIS — F250 Schizoaffective disorder, bipolar type: Secondary | ICD-10-CM | POA: Diagnosis not present
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
## 2022-11-12 MED ORDER — FLUTICASONE PROPIONATE 50 MCG/ACTUATION NASAL SPRAY,SUSPENSION
2 refills | 0 days
Start: 2022-11-12 — End: ?

## 2022-11-16 ENCOUNTER — Ambulatory Visit
Admit: 2022-11-16 | Discharge: 2022-11-17 | Payer: MEDICARE | Attending: Addiction (Substance Use Disorder) | Primary: Addiction (Substance Use Disorder)

## 2022-11-16 DIAGNOSIS — F250 Schizoaffective disorder, bipolar type: Secondary | ICD-10-CM | POA: Diagnosis not present

## 2022-11-16 DIAGNOSIS — R69 Illness, unspecified: Secondary | ICD-10-CM | POA: Diagnosis not present

## 2022-11-16 MED ORDER — FLUTICASONE PROPIONATE 50 MCG/ACTUATION NASAL SPRAY,SUSPENSION
2 refills | 0 days | Status: CP
Start: 2022-11-16 — End: ?
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
## 2022-11-24 ENCOUNTER — Ambulatory Visit
Admit: 2022-11-24 | Discharge: 2022-11-25 | Payer: MEDICARE | Attending: Addiction (Substance Use Disorder) | Primary: Addiction (Substance Use Disorder)

## 2022-11-24 DIAGNOSIS — F250 Schizoaffective disorder, bipolar type: Secondary | ICD-10-CM | POA: Diagnosis not present

## 2022-11-24 DIAGNOSIS — E1100 Type 2 diabetes mellitus with hyperosmolarity without nonketotic hyperglycemic-hyperosmolar coma (NKHHC): Principal | ICD-10-CM

## 2022-11-24 MED ORDER — EMPAGLIFLOZIN 10 MG TABLET
ORAL_TABLET | Freq: Every morning | ORAL | 3 refills | 30 days
Start: 2022-11-24 — End: ?

## 2022-11-24 NOTE — Unmapped (Signed)
Case Management Progress Note  [facility]       Start Time: 1523      End Time: 1525    Duration: 2 minutes              Date of Service:  11/24/2022      Service: Case Management - phone    Purpose of contact:        To refer to [facility] for ACTT    Additional Information/Plan:  CM obtained verbal permission from Pt to share his referral information with [facility] for ACTT services.     Roney Mans, MSW, LCSW  Case Manager - [facility] STEP Clinic   [phone]
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
## 2022-11-25 MED ORDER — EMPAGLIFLOZIN 10 MG TABLET
ORAL_TABLET | Freq: Every morning | ORAL | 3 refills | 30 days | Status: CP
Start: 2022-11-25 — End: ?

## 2022-11-30 ENCOUNTER — Institutional Professional Consult (permissible substitution)
Admit: 2022-11-30 | Discharge: 2022-12-01 | Payer: MEDICARE | Attending: Student in an Organized Health Care Education/Training Program | Primary: Student in an Organized Health Care Education/Training Program

## 2022-11-30 DIAGNOSIS — F250 Schizoaffective disorder, bipolar type: Secondary | ICD-10-CM | POA: Diagnosis not present

## 2022-11-30 MED ADMIN — paliperidone palmitate (INVEGA SUSTENNA) 156 mg/mL injection 156 mg: 156 mg | INTRAMUSCULAR | @ 18:00:00 | Stop: 2023-06-14

## 2022-11-30 NOTE — Unmapped (Signed)
[facility] Administered Medication Refill Coordination Note      NAME:Adrian Robinson DOB: Jul 24, 1976      Medication: [name]    Day Supply: 28 days      SHIPPING      Next delivery from [facility] [phone]) to [facility] [name] is scheduled for 05/14.    Clinic contact: Raden defrietas    Patient's next nurse visit for administration: unsure.    We will follow up with clinic monthly for standard refill processing and delivery.      Aubrielle Stroud Samella Parr  Specialty Pharmacy Technician

## 2022-11-30 NOTE — Unmapped (Signed)
Administered long-acting injectable today, 11/30/2022. See MAR for additional documentation. Adrian Robinson

## 2022-12-06 MED FILL — INVEGA SUSTENNA 156 MG/ML INTRAMUSCULAR SYRINGE: INTRAMUSCULAR | 28 days supply | Qty: 1 | Fill #4

## 2022-12-06 NOTE — Unmapped (Signed)
Case Management Progress Note  [facility]       Start Time: 1548      End Time: 1652    Duration: 64 minutes              Date of Service:  12/06/2022      Service: Case Management -  collateral    Purpose of contact:        To complete ACTT referral    Additional Information/Plan:  CM called Pt's mother to confirm that she is his legal guardian and payee. CM updated Pt's ACTT referral form with this information. Pt's mother asked CM about Pt's Medicaid status. CM couldn't find any record of Pt ever having Medicaid in Epic. CM and Pt's mother called [employer] in [place] to ask if he should have Medicaid due to having SSI but they were closed. CM and Pt's mother called [facility] DSS and they have no record of him ever having Medicaid either. They suggested that Pt's mother submit a Medicaid application. CM would like to talk to [facility] first. CM and Pt's mother scheduled to call [employer] together on Thursday. CM sent the ACTT referral form and supporting documents to [facility] in [place].     Roney Mans, MSW, LCSW  Case Manager - [facility] STEP Clinic   [phone]

## 2022-12-08 NOTE — Unmapped (Signed)
Case Management Progress Note  [facility]       Start Time: 1031      End Time: 1104    Duration: 33 minutes              Date of Service:  12/08/2022      Service: Case Management -  collateral    Purpose of contact:        To address Medicaid issue    Additional Information/Plan:  CM and Pt called [employer] in [place] and were told that Pt has never received SSI. He receives SSDI survivors benefits. This explains why Pt has no record of receiving Medicaid in Epic or in DSS records. Pt's mother will go to [place] DSS to apply for Medicaid. Pt's mother has not heard from [place] yet. CM will follow up in 2 weeks.     Roney Mans, MSW, LCSW  Case Manager - [facility] ST[facility]   [phone]

## 2022-12-19 ENCOUNTER — Ambulatory Visit: Admit: 2022-12-19 | Discharge: 2022-12-19 | Disposition: A | Discharge status: Home / Self Care | Payer: MEDICARE

## 2022-12-19 ENCOUNTER — Emergency Department: Admit: 2022-12-19 | Discharge: 2022-12-19 | Disposition: A | Discharge status: Home / Self Care | Payer: MEDICARE

## 2022-12-19 DIAGNOSIS — F250 Schizoaffective disorder, bipolar type: Secondary | ICD-10-CM | POA: Diagnosis not present

## 2022-12-19 DIAGNOSIS — M25552 Pain in left hip: Secondary | ICD-10-CM | POA: Diagnosis not present

## 2022-12-19 DIAGNOSIS — Z7984 Long term (current) use of oral hypoglycemic drugs: Secondary | ICD-10-CM | POA: Diagnosis not present

## 2022-12-19 DIAGNOSIS — F17210 Nicotine dependence, cigarettes, uncomplicated: Secondary | ICD-10-CM | POA: Diagnosis not present

## 2022-12-19 DIAGNOSIS — Z885 Allergy status to narcotic agent status: Secondary | ICD-10-CM | POA: Diagnosis not present

## 2022-12-19 DIAGNOSIS — I2510 Atherosclerotic heart disease of native coronary artery without angina pectoris: Secondary | ICD-10-CM | POA: Diagnosis not present

## 2022-12-19 DIAGNOSIS — M79605 Pain in left leg: Secondary | ICD-10-CM | POA: Diagnosis not present

## 2022-12-19 DIAGNOSIS — I10 Essential (primary) hypertension: Secondary | ICD-10-CM | POA: Diagnosis not present

## 2022-12-19 DIAGNOSIS — M79662 Pain in left lower leg: Secondary | ICD-10-CM | POA: Diagnosis not present

## 2022-12-19 DIAGNOSIS — M7989 Other specified soft tissue disorders: Secondary | ICD-10-CM | POA: Diagnosis not present

## 2022-12-19 DIAGNOSIS — Z882 Allergy status to sulfonamides status: Secondary | ICD-10-CM | POA: Diagnosis not present

## 2022-12-19 DIAGNOSIS — E119 Type 2 diabetes mellitus without complications: Secondary | ICD-10-CM | POA: Diagnosis not present

## 2022-12-19 DIAGNOSIS — I252 Old myocardial infarction: Secondary | ICD-10-CM | POA: Diagnosis not present

## 2022-12-19 LAB — BASIC METABOLIC PANEL
ANION GAP: 5 mmol/L (ref 5–14)
BLOOD UREA NITROGEN: 13 mg/dL (ref 9–23)
BUN / CREAT RATIO: 10
CALCIUM: 9.6 mg/dL (ref 8.7–10.4)
CHLORIDE: 98 mmol/L (ref 98–107)
CO2: 30 mmol/L (ref 20.0–31.0)
CREATININE: 1.26 mg/dL — ABNORMAL HIGH
EGFR CKD-EPI (2021) MALE: 70 mL/min/{1.73_m2} (ref >=60)
GLUCOSE RANDOM: 132 mg/dL (ref 70–179)
POTASSIUM: 4.6 mmol/L (ref 3.4–4.8)
SODIUM: 133 mmol/L — ABNORMAL LOW (ref 135–145)

## 2022-12-19 LAB — CBC W/ AUTO DIFF
BASOPHILS ABSOLUTE COUNT: 0.3 10*9/L — ABNORMAL HIGH (ref 0.0–0.1)
BASOPHILS RELATIVE PERCENT: 1.4 %
EOSINOPHILS ABSOLUTE COUNT: 0.2 10*9/L (ref 0.0–0.5)
EOSINOPHILS RELATIVE PERCENT: 1.1 %
HEMATOCRIT: 46.3 % (ref 39.0–48.0)
HEMOGLOBIN: 16 g/dL (ref 12.9–16.5)
LYMPHOCYTES ABSOLUTE COUNT: 2.7 10*9/L (ref 1.1–3.6)
LYMPHOCYTES RELATIVE PERCENT: 14.7 %
MEAN CORPUSCULAR HEMOGLOBIN CONC: 34.6 g/dL (ref 32.0–36.0)
MEAN CORPUSCULAR HEMOGLOBIN: 29.6 pg (ref 25.9–32.4)
MEAN CORPUSCULAR VOLUME: 85.4 fL (ref 77.6–95.7)
MEAN PLATELET VOLUME: 8.2 fL (ref 6.8–10.7)
MONOCYTES ABSOLUTE COUNT: 1.3 10*9/L — ABNORMAL HIGH (ref 0.3–0.8)
MONOCYTES RELATIVE PERCENT: 6.9 %
NEUTROPHILS ABSOLUTE COUNT: 13.8 10*9/L — ABNORMAL HIGH (ref 1.8–7.8)
NEUTROPHILS RELATIVE PERCENT: 75.9 %
PLATELET COUNT: 452 10*9/L — ABNORMAL HIGH (ref 150–450)
RED BLOOD CELL COUNT: 5.42 10*12/L (ref 4.26–5.60)
RED CELL DISTRIBUTION WIDTH: 14.3 % (ref 12.2–15.2)
WBC ADJUSTED: 18.2 10*9/L — ABNORMAL HIGH (ref 3.6–11.2)

## 2022-12-19 LAB — SLIDE REVIEW

## 2022-12-19 MED ADMIN — acetaminophen (TYLENOL) tablet 1,000 mg: 1000 mg | ORAL | @ 13:00:00 | Stop: 2022-12-19

## 2022-12-19 MED ADMIN — oxyCODONE (ROXICODONE) immediate release tablet 5 mg: 5 mg | ORAL | @ 13:00:00 | Stop: 2022-12-19

## 2022-12-19 NOTE — Unmapped (Addendum)
Pt complaining of lt leg pain  2 days. Visible swelling and redness to left foot. Pt reports severe pain all the from his shin up to hip. Denies SOB or chest pain. No visible trauma.

## 2022-12-19 NOTE — Unmapped (Signed)
LLE pain r/t an achilles injury years ago.

## 2022-12-19 NOTE — Unmapped (Signed)
[facility] Emergency Department Provider Note    ED Clinical Impression     Final diagnoses:   Left leg pain (Primary)   Left leg swelling     History     CHIEF COMPLAINT:   Chief Complaint   Patient presents with    Leg Pain     HPI: Adrian Robinson is a 47 y.o. male with a history of meningitis in childhood, hearing loss, schizoaffective disorder, stroke, CAD, NSTEMI, hypertension, diabetes non-insulin-dependent, prior Achilles injury that did not require surgery now with chronic left lower extremity pain and swelling presents emergency department today for evaluation of left lower extremity pain and swelling.  Patient reports pain has been going on since yesterday evening, it is primarily in his left buttock, occasionally radiates slightly upward toward his back and down toward his leg, worse with movement.  He also reports acute on chronic pain in his left lower extremity and worsening acute on chronic swelling of his left foot and left calf.  He does report some mild calf pain.  No recent fevers, chest pain, or shortness of breath.  He has no history of DVT, is not on any anticoagulation.  No recent falls or traumas.  No rash.  Patient did get his Invega injection in his left buttock about 1 to 2 weeks ago, and does state that occasionally this causes some left buttock pain.    History provided/supplemented by patient and mother.    Physical Exam   BP 116/81  - Pulse 60  - Temp 36.5 ??C (97.7 ??F) (Oral)  - Resp 19  - Wt (!) 108.9 kg (240 lb)  - SpO2 94%  - BMI 34.44 kg/m??     Constitutional: Alert and oriented. No acute distress. Well-appearing.  Eyes: Conjunctivae are normal.  HEENT: Normocephalic and atraumatic. Conjunctivae clear. No congestion. Moist mucous membranes.   Cardiovascular: Rate as above, regular rhythm. Normal and symmetric distal pulses. Brisk capillary refill. Normal skin turgor.  Respiratory: Normal respiratory effort. Breath sounds are normal. There are no wheezing or crackles heard.  Gastrointestinal: Soft, non-distended, non-tender.  Genitourinary: Deferred.  Extremities/musculoskeletal: Patient has mild pain at the left buttock along the region of the piriformis without overlying skin change or swelling.  He has mild left anterior hip tenderness.  He has mild left hip and buttock pain with range of motion of the left hip.  He has mild swelling of the left calf in comparison to the right with mild associated posterior left calf tenderness.  He has mild swelling of the left foot without significant pain.  DP and PT pulses are 2+ on the left.  He has no erythema of the left lower extremity.  Range of motion is intact at all joints of the left lower extremity.  Strength 5/5 and equal throughout the bilateral lower extremities throughout.  He has no midline low back pain or SI joint tenderness.  Otherwise, non-tender with normal range of motion in all extremities. No lower extremity edema.  Neurologic: Normal speech and language. No gross focal neurologic deficits are appreciated. Patient is moving all extremities equally, face is symmetric at rest and with speech.  Skin: Skin is warm, dry and intact. No rash noted.  Psychiatric: Mood and affect are normal. Speech and behavior are normal.    Assessment & Plan, ED Course     47 year old man with history as above presents emergency department today for evaluation of left lower extremity pain and swelling as described above.  Vital signs are all reassuring.  Detailed examination as above.  Differential includes piriformis syndrome, DVT, osteoarthritis of the hip, post Invega injection site pain at the left buttock amongst other potential etiologies.  No clinically for cellulitis, neurovascular compromise, or phlegmasia cerulea dolens.  No clinical concern for myositis or necrotizing soft tissue/deep space infection.  Plan to treat pain with oxycodone and Tylenol.  Plan for basic labs, left hip x-ray, and PVL of the left lower extremity.    Diagnostic orders as below.    Orders Placed This Encounter   Procedures    XR Hip 2 Views Left    CBC w/ Differential    Basic metabolic panel       ED Course:  ED Course as of 12/19/22 1328   Mon Dec 19, 2022   1008 Personally interpreted left hip x-ray which is without obvious evidence of fracture or dislocation.  Radiology read agrees no acute abnormalities.   1008 Creatinine(!): 1.26  Slightly above baseline, not significant of AKI.   1009 WBC(!): 18.2  Interesting but nonspecific.  Patient has no clinical signs or symptoms of infection by history or examination.   1202 PVL Venous Duplex Lower Extremity Left  Left Duplex Findings  All veins visualized appear fully compressible. Doppler flow signals demonstrate normal spontaneity, phasicity, and augmentation.  Right Technical Summary  Not evaluated.  Left Technical Summary  No evidence of deep venous obstruction in the lower extremity. No indirect evidence of obstruction proximal to the inguinal ligament.   1327 Reassessed patient who states that he no longer has any pain.  Discussed overall reassuring workup with him and his mother.  Encouraged use of Tylenol, continue mobility, heating pads, lidocaine patches as needed at home.  Pain may be due to recent Invega injection versus muscle strain.  Advised PCP follow-up within several days for reassessment.  Return precautions were discussed  Discharge instructions.  Patient mother are happy with this plan and amenable to discharge.       Discussion of Management with other Physicians, QHP or Appropriate Source: As above in Assessment and Plan or ED Course.  Independent Interpretation of Studies: As above in Assessment and Plan or ED Course.  External Records Reviewed: I have reviewed recent and relevant previous record, including: Outpatient notes - reviewed psychiatry note from 11/24/2022 outlining his prior history and current therapies  Escalation of Care including OBS/Admission/Transfer was considered: However, patient was determined appropriate for outpatient management. See above for further details.  _____________________________________________________________________    The case was discussed with attending physician who is in agreement with the above assessment and plan    Additional History     PAST MEDICAL HISTORY/PAST SURGICAL HISTORY:   Past Medical History:   Diagnosis Date    Asthma     Bell's palsy     Depression     Diabetes (CMS-HCC)     Hearing loss     Heart disease     HTN (hypertension)     Meningitis spinal     as child    NSTEMI (non-ST elevated myocardial infarction) (CMS-HCC) 06/22/2017    Right-sided Bell's palsy     Schizoaffective disorder, bipolar type (CMS-HCC)     Sinus disease     Stroke (CMS-HCC)        Past Surgical History:   Procedure Laterality Date    INNER EAR SURGERY Right 2014    INNER EAR SURGERY Left 1993    PR CATH PLACE/CORON ANGIO, IMG SUPER/INTERP,W  LEFT HEART VENTRICULOGRAPHY N/A 06/22/2017    Procedure: Left Heart Catheterization;  Surgeon: Jacquelyne Balint, MD;  Location: [facility] CATH;  Service: Cardiology    SINUS SURGERY  1990       MEDICATIONS:     Current Facility-Administered Medications:     paliperidone palmitate (INVEGA SUSTENNA) 156 mg/mL injection 156 mg, 156 mg, Intramuscular, Q28 Days, Babette Relic, MD, 156 mg at 11/30/22 1357    Current Outpatient Medications:     albuterol HFA 90 mcg/actuation inhaler, INHALE 2 PUFFS BY MOUTH EVERY 6 HOURS AS NEEDED FOR WHEEZE, Disp: 18 g, Rfl: 3    amLODIPine (NORVASC) 10 MG tablet, Take 1 tablet (10 mg total) by mouth daily., Disp: 90 tablet, Rfl: 3    ARIPiprazole (ABILIFY) 15 MG tablet, Take 1 tablet (15 mg total) by mouth daily., Disp: 90 tablet, Rfl: 3    atorvastatin (LIPITOR) 80 MG tablet, Take 1 tablet (80 mg total) by mouth daily., Disp: 90 tablet, Rfl: 3    blood sugar diagnostic (ONETOUCH VERIO TEST STRIPS) Strp, Use to test blood sugar daily as directed by clinic., Disp: 100 each, Rfl: 3 blood-glucose meter (ONETOUCH VERIO FLEX METER) Misc, Use as directed to test blood sugar as directed in clinic, Disp: 1 each, Rfl: 11    divalproex ER (DEPAKOTE ER) 500 MG extended released 24 hr tablet, Take 2 tablets (1,000 mg total) by mouth daily., Disp: 180 tablet, Rfl: 3    empagliflozin (JARDIANCE) 10 mg tablet, Take 1 tablet (10 mg total) by mouth every morning., Disp: 30 tablet, Rfl: 3    fluticasone propionate (FLONASE) 50 mcg/actuation nasal spray, SPRAY 1 SPRAY INTO EACH NOSTRIL EVERY DAY, Disp: 32 mL, Rfl: 2    gabapentin (NEURONTIN) 300 MG capsule, Take 1 capsule (300 mg total) by mouth Two (2) times a day., Disp: 180 capsule, Rfl: 3    glipiZIDE (GLUCOTROL) 5 MG tablet, Take 1 tablet (5 mg total) by mouth Two (2) times a day., Disp: 180 tablet, Rfl: 3    hydroCHLOROthiazide (HYDRODIURIL) 25 MG tablet, Take 1 tablet (25 mg total) by mouth daily., Disp: 90 tablet, Rfl: 3    isosorbide mononitrate (IMDUR) 30 MG 24 hr tablet, Take 1 tablet (30 mg total) by mouth daily., Disp: 30 tablet, Rfl: 0    lancets (ONETOUCH DELICA PLUS LANCET) 33 gauge Misc, Use to test blood sugar daily as directed by clinic., Disp: 100 each, Rfl: 3    lisinopriL (PRINIVIL,ZESTRIL) 40 MG tablet, Take 1 tablet (40 mg total) by mouth daily., Disp: 90 tablet, Rfl: 3    metFORMIN (GLUCOPHAGE) 1000 MG tablet, Take 1 tablet (1,000 mg total) by mouth in the morning and 1 tablet (1,000 mg total) in the evening. Take with meals., Disp: 180 tablet, Rfl: 3    metoprolol tartrate (LOPRESSOR) 25 MG tablet, Take 1 tablet (25 mg total) by mouth Two (2) times a day., Disp: 180 tablet, Rfl: 3    nicotine (NICODERM CQ) 21 mg/24 hr patch, Place 1 patch on the skin daily., Disp: 28 patch, Rfl: 2    nicotine polacrilex (NICORETTE) 4 MG gum, Apply 1 each (4 mg total) to cheek every hour as needed for smoking cessation. Weeks 1-6: Chew 1 piece of gum every 1-2 hours as needed, Disp: 100 each, Rfl: 3    paliperidone palmitate (INVEGA SUSTENNA) 156 mg/mL Syrg, Inject 1 mL (156 mg total) into the muscle every twenty-eight (28) days., Disp: 1 mL, Rfl: 11    paliperidone palmitate (INVEGA  SUSTENNA) 156 mg/mL Syrg, Inject 1 mL (156 mg total) into the muscle every twenty-eight (28) days., Disp: 1 mL, Rfl: 2    traZODone (DESYREL) 50 MG tablet, Take 1 tablet (50 mg total) by mouth nightly., Disp: 90 tablet, Rfl: 3    ALLERGIES:   Sulfa (sulfonamide antibiotics), Codeine, Erythromycin, and Sulfasalazine    SOCIAL HISTORY:   Social History     Tobacco Use    Smoking status: Every Day     Current packs/day: 3.00     Types: Cigarettes    Smokeless tobacco: Never    Tobacco comments:     2-3cpd   Substance Use Topics    Alcohol use: No     Alcohol/week: 0.0 standard drinks of alcohol       FAMILY HISTORY:  Family History   Problem Relation Age of Onset    COPD Mother     Hypertension Mother     Thyroid disease Mother     Hyperlipidemia Mother     Diabetes Father     Heart failure Father     Stroke Maternal Grandfather     Heart attack Maternal Grandfather     Seizures Paternal Grandmother     Glaucoma Maternal Aunt     Macular degeneration Neg Hx     Blindness Neg Hx         Radiology     PVL Venous Duplex Lower Extremity Left         XR Hip 2 Views Left   Final Result   No acute fracture or malalignment of the left hip.          Labs     Labs Reviewed   BASIC METABOLIC PANEL - Abnormal; Notable for the following components:       Result Value    Sodium 133 (*)     Creatinine 1.26 (*)     All other components within normal limits   CBC W/ AUTO DIFF - Abnormal; Notable for the following components:    WBC 18.2 (*)     Platelet 452 (*)     Absolute Neutrophils 13.8 (*)     Absolute Monocytes 1.3 (*)     Absolute Basophils 0.3 (*)     All other components within normal limits    Narrative:     WBC, PLT and/or Differential requires smear review. Expected TAT is 2 hours.   SLIDE REVIEW - Abnormal; Notable for the following components:    Smear Review Comments See Comment (*) Toxic Vacuolation Present (*)     All other components within normal limits   CBC W/ DIFFERENTIAL    Narrative:     The following orders were created for panel order CBC w/ Differential.                  Procedure                               Abnormality         Status                                     ---------                               -----------         ------  CBC w/ [id number]         Abnormal            Final result                               Morphology [id number]]           Abnormal            Final result                                                 Please view results for these tests on the individual orders.       Pertinent labs & imaging results that were available during my care of the patient were reviewed by me and considered in my medical decision making (see chart for details).    Please note- This chart has been created using [employer]. Chart creation errors have been sought, but may not always be located and such creation errors, especially pronoun confusion, do NOT reflect on the standard of medical care.    Konrad Penta, MD  EM PGY-3       Konrad Penta, MD  Resident  12/19/22 [phone]

## 2022-12-20 NOTE — Unmapped (Signed)
Case Management Progress Note   Psychiatry       Start Time: 1345      End Time: 1346    Duration: 1 minutes              Date of Service:  12/20/2022      Service: Case Management -  Collateral    Purpose of contact:        Follow up on ACTT referral and Medicaid application    Additional Information/Plan:  CM left voice mail    Roney Mans, MSW, LCSW  Case Manager - [facility] STEP Clinic   [phone]

## 2022-12-21 NOTE — Unmapped (Signed)
[facility] Administered Medication Refill Coordination Note      NAME:Adrian Robinson DOB: Oct 04, 1975      Medication: [name]    Day Supply: 28 days      SHIPPING      Next delivery from [facility] [phone]) to [facility] for [name] is scheduled for 06/04.    Clinic contact: Loleta Dicker    Patient's next nurse visit for administration: 06/06.    We will follow up with clinic monthly for standard refill processing and delivery.      Abid Bolla Samella Parr  Specialty Pharmacy Technician

## 2022-12-21 NOTE — Unmapped (Signed)
Case Management Progress Note  [facility]       Start Time: 1155      End Time: 1158    Duration: 3 minutes              Date of Service:  12/21/2022      Service: Case Management -  collateral    Purpose of contact:        Follow up on ACTT referral and Medicaid application    Additional Information/Plan:  Pt's mother confirmed that she has submitted an application for Medicaid on Pt's behalf. Pt's mother has not heard from [facility] ACT team. CM will follow up next week.     Roney Mans, MSW, LCSW  Case Manager - [facility] STEP Clinic   [phone]

## 2022-12-27 MED FILL — INVEGA SUSTENNA 156 MG/ML INTRAMUSCULAR SYRINGE: INTRAMUSCULAR | 28 days supply | Qty: 1 | Fill #5

## 2022-12-28 MED ORDER — METFORMIN 1,000 MG TABLET
ORAL_TABLET | Freq: Two times a day (BID) | ORAL | 2 refills | 90 days | Status: CP
Start: 2022-12-28 — End: 2023-08-17

## 2022-12-29 NOTE — Unmapped (Signed)
Patient and his mom came by the office today and stated they will refuse his LAI due to pain in his LT leg, I advised patient to schedule an appointment with provider.

## 2022-12-30 NOTE — Unmapped (Signed)
Case Management Progress Note  [facility]       Start Time: 1621      End Time: 1651    Duration: 30 minutes              Date of Service:  12/29/2022      Service: Case Management -  Collateral    Purpose of contact:        Follow up on ACTT referral    Additional Information/Plan:  Pt's guardian stated that she has not heard from the [facility] ACT team yet. CM spoke with the ACTT program manager who stated that they have a 2 month wait and will reach out to Pt and his guardian on 8/5 at the soonest, unless they have a cancellation. CM and Pt's guardian also discussed contacting Pt's psychiatrist about Pt's desire to change his medication. CM emphasized that Pt's injection was due today so they need to discuss a plan with his provider as soon as possible. Pt and his guardian expressed understanding and stated they have the phone number for STEP. CM sent Epic Inbasket to Pt's [facility] treatment team.     Roney Mans, MSW, LCSW  Case Manager - [facility] STEP Clinic   [phone]

## 2022-12-30 NOTE — Unmapped (Signed)
Spoke to patient and mom over the phone regarding missed Invega Sustenna dose.  Patient recently presented to the ED on May 27 due to left leg pain where his previous [name] shot was.  Workup was negative and pain likely due to discomfort secondary to the shot.  Patient and mom inquiring if oral paliperidone can be used instead of [name].  Will continue off of the [name] and discuss option to start oral paliperidone at his in person visit on June 17.  No further intervention at this time.

## 2022-12-30 NOTE — Unmapped (Signed)
Account Number [id number]   Account Name [facility]   Call Id [id number]   Call Start Time 12/29/2022 04:13:56 PM PT  (12/29/2022 07:13:56 PM ET)   Call End Time 12/29/2022 04:30:05 PM PT  (12/29/2022 07:30:05 PM ET)   Call Duration 00:16:09   Total Handle Time 00:16:29   Finalized Time 12/29/2022 04:36:22 PM PT  (12/29/2022 07:36:22 PM ET)   Finalized Call Taker [place], APC, License Number: [id number], License State: [place]   Responsible Call Taker [place], APC, License Number: [id number], License State: [place]   Level of Care Urgent   Call Type Counseling Request     (Person Information)  Caller Bertran Girard   Caller Alternate Name    Caller Primary Phone [phone]  Is it ok to leave a message : Yes   Is Caller the POC Yes   Person of Concern Omarien Torr   Person of Concern Alternate Name    Person of Concern Primary Phone [phone]  Is it ok to leave a message : Yes     New Call  Insurance coverage? Medicare   -[facility]   -[place]    Is the [employer] or Person of Concern part of the [employer] program? No   Service Request Type For Financial controller Gender Male   -[place] Address [address] (current)  [address]  [place], [place]  [address]  [place]  [place]   -Date of birth? Birthdate: 01-24-1976  Age: 40  Comment:    Currently enrolled in services with [facility]? Yes (see below; document Clinic in text section if known/provided)   Currently enrolled in services? Herbert Seta   -Current therapist or case manager? N/A   -Current therapist or case manager?      Clinical Assessment 2.2  Primary Reason for Call Medication   Assessed Initial Level of Distress Moderate   Overview Bl[place] is seeking support due medication concerns   Assessed Presentation Sad/Dysphoric   Suicide Risk Assessment Assessment indicates no risk related to suicide   Intentional Self-Injury Risk Assessment Not Relevant Threat of Violence Risk Assessment Assessment indicates no risk related to harm to others   Substance Use Risk Assessment Assessment indicates no risk related to substance use   Interpersonal Violence Risk Assessment Assessment indicates no risk related to interpersonal violence   Abuse-related Risk Assessment Not Relevant   Psychiatric Medication Assessment Confirms taking listed medication(s) as prescribed *   Narrative Psychiatric Medication Assessment PSYCHIATRIC MEDICATION NAME(S): Shot for mental health DESCRIBE CONCERNS/RISK, IF ANY: Holter reported that he got a shot 1 month ago on his left hip, and he is still experiencing pain all the down his left leg, and eye twitching    Interventions Provided empathic listening and validation.  Advised calling this line if future risk or urgent concerns arise.  Advised that services are available to caller 24/7.  Consulted with Social worker.  Other interventions (describe as relevant)  Encouraged caller to follow up with current provider.  Educated about when Emergency Services are an appropriate option.   Additional Intervention Details Counselor consulted with Supervisor Mauro Kaufmann [facility] regarding Yazzie reporting medication concerns. We agreed that safety planning would be most appropriate due to [facility] and his mother declined the need to go to the hospital at the time of the call. [place] reported having an intellectual disability and being unsure whether he can wait until the next business day.  Counselor spoke to [name]'s mother, Zada Girt [phone]) who informed that she took Mcnerney to [facility] Emergency hospital 1 week ago, and the ER doctor informed that Willy should not take that shot again. Mother declined taking POC to the hospital, and informed that she will take Javed to the doctor's or the ER tomorrow.    Describe clinical justification and rationale No further interventions are needed because there is no evidence of imminent risk or danger.    Assessed Concluding Level of Distress Mild   Level of Care (LOC) Urgent   Pick one outcome from the drop-down list. If more than one applies, choose the highest level of intervention utilized 8. No higher level intervention needed/available.   Caller's Plan SELF CARE: RISK MITIGATION: [name] will follow up with [name]'s provider tomorrow. CONTINGENCY: [name] and Kra[place]l are aware that they can call back for additional support 24/7 as needed, or call 911 for emergencies    Follow-up Expectations No follow-up expected     Call Experience Survey  This survey should NOT be offered to callers/visitors who are minors, for any situation where Interpersonal Violence was assessed as a concern, for Emergent level of care, or to noteworthy callers/visitors.    DO NOT send text survey links to individuals that you have not spoken to and who have not provided consent.    Offer to send a text message with a 5 question feedback survey. Does Caller/POC consent to receive a text message?
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
## 2023-01-09 ENCOUNTER — Telehealth: Admit: 2023-01-09 | Discharge: 2023-01-10 | Payer: MEDICARE

## 2023-01-09 MED ORDER — ARIPIPRAZOLE 15 MG TABLET
ORAL_TABLET | Freq: Every day | ORAL | 3 refills | 30 days | Status: CP
Start: 2023-01-09 — End: 2023-05-09

## 2023-01-09 MED ORDER — DIVALPROEX ER 500 MG TABLET,EXTENDED RELEASE 24 HR
ORAL_TABLET | Freq: Every day | ORAL | 3 refills | 30 days | Status: CP
Start: 2023-01-09 — End: 2023-05-09

## 2023-01-09 NOTE — Unmapped (Signed)
Follow-up instructions:  -- Please continue taking your medications as prescribed for your mental health.   -- Do not make changes to your medications, including taking more or less than prescribed, unless under the supervision of your physician. Be aware that some medications may make you feel worse if abruptly stopped  -- Please refrain from using illicit substances, as these can affect your mood and could cause anxiety or other concerning symptoms.   -- Seek further medical care for any increase in symptoms or new symptoms such as thoughts of wanting to hurt yourself or hurt others.     Contact info:  Life-threatening emergencies: Call 911, the 988 suicide and crisis lifeline, or go to the nearest ER for medical or psychiatric attention.           Issues that need urgent attention but are not life threatening: Call the clinic outpatient front desk for assistance. [phone] for our [place] clinic located at [address]. [phone] for our [place] [facility] clinic.    Non-urgent routine concerns and questions: Send a message through [facility] or call our clinic front desk.    Refill requests: Check with your pharmacy to initiate refill requests.    Regarding appointments:  - If you need to cancel your appointment, we ask that you call your clinic at least 24 hours before your scheduled appointment. [phone] for our [place] clinic located at [address]. [phone] for our [place] [facility] clinic.  - If for any reason you arrive 15 minutes later than your scheduled appointment time, you may not be seen and your visit may be rescheduled.  - Please remember that we will not automatically reschedule missed appointments.  - If you miss two (2) appointments without letting us know in advance, you will likely be referred to a provider in your community.  - We will do our best to be on time. Sometimes an emergency will arise that might cause your clinician to be late. We will try to inform you of this when you check in for your appointment. If you wait more than 15 minutes past your appointment time without such notice, please speak with the front desk staff.    In the event of bad weather, the clinic staff will attempt to contact you, should your appointment need to be rescheduled. Additionally, you can call the Patient Weather Line [phone] for system-wide clinic status    For more information and reminders regarding clinic policies (these were provided when you were admitted to the clinic), please ask the front desk.

## 2023-01-11 ENCOUNTER — Ambulatory Visit
Admit: 2023-01-11 | Payer: MEDICARE | Attending: Student in an Organized Health Care Education/Training Program | Primary: Student in an Organized Health Care Education/Training Program

## 2023-01-14 NOTE — Unmapped (Signed)
Addended by: Ulla Potash L on: 01/14/2023 12:54 PM     Modules accepted: Level of Service

## 2023-01-14 NOTE — Unmapped (Signed)
This was a telehealth service where a resident was involved. I was immediately available via telephone/pager and present on site, but I did not see the patient. I reviewed and discussed the encounter with the resident. I am in agreement with the assessment and plan as documented.      Adrian Michelsen L Siria Calandro, MD

## 2023-01-18 MED ORDER — TRAZODONE 50 MG TABLET
ORAL_TABLET | Freq: Every evening | ORAL | 3 refills | 90 days | Status: CP
Start: 2023-01-18 — End: ?

## 2023-01-18 NOTE — Unmapped (Signed)
Specialty Medication(s): Invega     Adrian Robinson has been dis-enrolled from the [facility] specialty pharmacy services due to  transitioned to oral medication per Raden deFreitas  .    Additional information provided to the patient:      Antonietta Barcelona  [facility] Specialty Pharmacist

## 2023-01-18 NOTE — Unmapped (Signed)
The original prescription was reordered on 01/18/2023 by Harl Favor. Renewing this prescription may not be appropriate.

## 2023-02-06 ENCOUNTER — Ambulatory Visit: Admit: 2023-02-06 | Discharge: 2023-02-07 | Payer: MEDICARE

## 2023-02-06 MED ORDER — ARIPIPRAZOLE 15 MG TABLET
ORAL_TABLET | Freq: Every day | ORAL | 3 refills | 30 days | Status: CP
Start: 2023-02-06 — End: 2023-06-06

## 2023-02-06 MED ORDER — DIVALPROEX ER 500 MG TABLET,EXTENDED RELEASE 24 HR
ORAL_TABLET | Freq: Every day | ORAL | 3 refills | 30 days | Status: CP
Start: 2023-02-06 — End: 2023-06-06

## 2023-03-12 ENCOUNTER — Ambulatory Visit
Admit: 2023-03-12 | Discharge: 2023-03-12 | Disposition: A | Discharge status: Home / Self Care | Payer: MEDICARE | Attending: Family

## 2023-03-12 DIAGNOSIS — M7989 Other specified soft tissue disorders: Principal | ICD-10-CM

## 2023-04-03 ENCOUNTER — Ambulatory Visit: Admit: 2023-04-03 | Discharge: 2023-04-04 | Discharge status: Against Medical Advice | Payer: MEDICARE

## 2023-04-03 DIAGNOSIS — Z79899 Other long term (current) drug therapy: Principal | ICD-10-CM

## 2023-04-03 DIAGNOSIS — F250 Schizoaffective disorder, bipolar type: Principal | ICD-10-CM

## 2023-04-03 MED ORDER — ARIPIPRAZOLE 15 MG TABLET
ORAL_TABLET | Freq: Every day | ORAL | 3 refills | 30 days | Status: CP
Start: 2023-04-03 — End: 2023-08-01

## 2023-04-03 MED ORDER — DIVALPROEX ER 500 MG TABLET,EXTENDED RELEASE 24 HR
ORAL_TABLET | Freq: Every day | ORAL | 3 refills | 30 days | Status: CP
Start: 2023-04-03 — End: 2023-08-01

## 2023-04-03 MED ORDER — PALIPERIDONE ER 3 MG TABLET,EXTENDED RELEASE 24 HR
ORAL_TABLET | Freq: Every morning | ORAL | 2 refills | 30 days | Status: CP
Start: 2023-04-03 — End: 2023-07-02

## 2023-04-05 ENCOUNTER — Ambulatory Visit: Admit: 2023-04-05 | Payer: MEDICARE

## 2023-04-05 ENCOUNTER — Ambulatory Visit
Admit: 2023-04-05 | Discharge: 2023-04-18 | Disposition: A | Discharge status: Home / Self Care | Payer: MEDICARE | Admitting: Psychiatry

## 2023-04-17 MED ORDER — HYDROXYZINE HCL 25 MG TABLET
ORAL_TABLET | Freq: Four times a day (QID) | ORAL | 0 refills | 8 days | PRN
Start: 2023-04-17 — End: 2023-05-17

## 2023-04-17 MED ORDER — METOPROLOL TARTRATE 25 MG TABLET
ORAL_TABLET | Freq: Two times a day (BID) | ORAL | 0 refills | 30 days
Start: 2023-04-17 — End: 2023-05-17

## 2023-04-17 MED ORDER — DIVALPROEX ER 500 MG TABLET,EXTENDED RELEASE 24 HR
ORAL_TABLET | Freq: Every evening | ORAL | 0 refills | 30 days
Start: 2023-04-17 — End: 2023-05-17

## 2023-04-18 MED ORDER — DIVALPROEX ER 500 MG TABLET,EXTENDED RELEASE 24 HR
ORAL_TABLET | Freq: Every evening | ORAL | 0 refills | 30 days | Status: CP
Start: 2023-04-18 — End: 2023-05-18

## 2023-04-18 MED ORDER — METOPROLOL SUCCINATE ER 50 MG TABLET,EXTENDED RELEASE 24 HR
ORAL_TABLET | Freq: Every day | ORAL | 0 refills | 30 days | Status: CP
Start: 2023-04-18 — End: 2023-05-18

## 2023-04-18 MED ORDER — AMLODIPINE 10 MG TABLET
ORAL | 0 refills | 30 days | Status: CP
Start: 2023-04-18 — End: ?

## 2023-04-18 MED ORDER — GABAPENTIN 300 MG CAPSULE
ORAL_CAPSULE | Freq: Two times a day (BID) | ORAL | 0 refills | 30 days | Status: CP
Start: 2023-04-18 — End: 2023-05-18

## 2023-04-18 MED ORDER — ARIPIPRAZOLE 15 MG TABLET
ORAL_TABLET | Freq: Every day | ORAL | 0 refills | 30 days | Status: CP
Start: 2023-04-18 — End: 2023-05-18

## 2023-04-18 MED ORDER — METOPROLOL TARTRATE 25 MG TABLET
ORAL_TABLET | Freq: Every day | ORAL | 0 refills | 30 days | Status: CN
Start: 2023-04-18 — End: 2023-05-18

## 2023-04-18 MED ORDER — PANTOPRAZOLE 20 MG TABLET,DELAYED RELEASE
ORAL_TABLET | Freq: Every day | ORAL | 0 refills | 30 days | Status: CP
Start: 2023-04-18 — End: 2023-05-18

## 2023-04-18 MED ORDER — HYDROXYZINE HCL 25 MG TABLET
ORAL_TABLET | Freq: Four times a day (QID) | ORAL | 0 refills | 8 days | Status: CP | PRN
Start: 2023-04-18 — End: 2023-05-18

## 2023-04-18 MED ORDER — ATORVASTATIN 80 MG TABLET
ORAL | 0 refills | 30 days | Status: CP
Start: 2023-04-18 — End: 2023-05-18

## 2023-04-18 MED ORDER — LISINOPRIL 40 MG TABLET
ORAL_TABLET | Freq: Every day | ORAL | 0 refills | 30 days | Status: CP
Start: 2023-04-18 — End: 2023-05-18

## 2023-04-18 MED ORDER — GLIPIZIDE 5 MG TABLET
ORAL_TABLET | Freq: Two times a day (BID) | ORAL | 0 refills | 30 days | Status: CP
Start: 2023-04-18 — End: 2023-05-18

## 2023-04-18 MED ORDER — NICOTINE 21 MG/24 HR DAILY TRANSDERMAL PATCH
MEDICATED_PATCH | TRANSDERMAL | 2 refills | 14 days | Status: CP
Start: 2023-04-18 — End: ?

## 2023-04-18 MED ORDER — FLUTICASONE FUROATE 100 MCG-VILANTEROL 25 MCG/DOSE INHALATION POWDER
Freq: Every day | RESPIRATORY_TRACT | 0 refills | 1.00000 days | Status: CP
Start: 2023-04-18 — End: ?

## 2023-04-18 MED ORDER — METFORMIN 1,000 MG TABLET
ORAL_TABLET | Freq: Two times a day (BID) | ORAL | 0 refills | 30 days | Status: CP
Start: 2023-04-18 — End: 2023-05-18

## 2023-04-18 MED ORDER — EMPAGLIFLOZIN 10 MG TABLET
ORAL_TABLET | Freq: Every morning | ORAL | 0 refills | 30 days | Status: CP
Start: 2023-04-18 — End: 2023-05-18

## 2023-04-26 ENCOUNTER — Ambulatory Visit
Admit: 2023-04-26 | Discharge: 2023-04-27 | Payer: MEDICARE | Attending: Student in an Organized Health Care Education/Training Program | Primary: Student in an Organized Health Care Education/Training Program

## 2023-04-26 DIAGNOSIS — E1100 Type 2 diabetes mellitus with hyperosmolarity without nonketotic hyperglycemic-hyperosmolar coma (NKHHC): Principal | ICD-10-CM

## 2023-04-26 DIAGNOSIS — I10 Essential (primary) hypertension: Principal | ICD-10-CM

## 2023-04-26 DIAGNOSIS — R0602 Shortness of breath: Principal | ICD-10-CM

## 2023-04-26 DIAGNOSIS — Z09 Encounter for follow-up examination after completed treatment for conditions other than malignant neoplasm: Principal | ICD-10-CM

## 2023-04-26 DIAGNOSIS — J4520 Mild intermittent asthma, uncomplicated: Principal | ICD-10-CM

## 2023-04-26 MED ORDER — ALBUTEROL SULFATE HFA 90 MCG/ACTUATION AEROSOL INHALER
RESPIRATORY_TRACT | 3 refills | 0 days | Status: CP | PRN
Start: 2023-04-26 — End: ?

## 2023-04-26 MED ORDER — FLUTICASONE FUROATE 100 MCG-VILANTEROL 25 MCG/DOSE INHALATION POWDER
Freq: Every day | RESPIRATORY_TRACT | 0 refills | 1 days | Status: CP
Start: 2023-04-26 — End: ?

## 2023-05-07 MED ORDER — GLIPIZIDE 5 MG TABLET
ORAL_TABLET | Freq: Two times a day (BID) | ORAL | 2 refills | 0 days
Start: 2023-05-07 — End: ?

## 2023-05-09 MED ORDER — GLIPIZIDE 5 MG TABLET
ORAL_TABLET | Freq: Two times a day (BID) | ORAL | 2 refills | 90 days | Status: CP
Start: 2023-05-09 — End: ?

## 2023-06-26 ENCOUNTER — Ambulatory Visit: Admit: 2023-06-26 | Discharge: 2023-06-27

## 2023-06-26 MED ORDER — GABAPENTIN 300 MG CAPSULE
ORAL_CAPSULE | ORAL | 3 refills | 0 days
Start: 2023-06-26 — End: ?

## 2023-06-26 MED ORDER — METOPROLOL TARTRATE 25 MG TABLET
ORAL_TABLET | Freq: Two times a day (BID) | ORAL | 3 refills | 0 days
Start: 2023-06-26 — End: ?

## 2023-06-27 MED ORDER — HYDROCHLOROTHIAZIDE 25 MG TABLET
Freq: Every day | ORAL | 3 refills | 0 days
Start: 2023-06-27 — End: 2024-06-26

## 2023-06-28 MED ORDER — GABAPENTIN 300 MG CAPSULE
ORAL_CAPSULE | ORAL | 3 refills | 0 days | Status: CP
Start: 2023-06-28 — End: ?

## 2023-06-28 MED ORDER — METOPROLOL SUCCINATE ER 50 MG TABLET,EXTENDED RELEASE 24 HR
ORAL_TABLET | Freq: Every day | ORAL | 11 refills | 30 days | Status: CP
Start: 2023-06-28 — End: 2024-06-27

## 2023-06-29 MED ORDER — AMLODIPINE 10 MG TABLET
ORAL_TABLET | Freq: Every day | ORAL | 2 refills | 90 days | Status: CP
Start: 2023-06-29 — End: 2024-04-25

## 2023-07-10 MED ORDER — DIVALPROEX ER 500 MG TABLET,EXTENDED RELEASE 24 HR
ORAL_TABLET | Freq: Every evening | ORAL | 0 refills | 30.00 days
Start: 2023-07-10 — End: 2023-08-09

## 2023-07-13 MED ORDER — DIVALPROEX ER 500 MG TABLET,EXTENDED RELEASE 24 HR
ORAL_TABLET | Freq: Every evening | ORAL | 0 refills | 30.00 days
Start: 2023-07-13 — End: 2023-08-12

## 2023-08-22 MED ORDER — DIVALPROEX ER 500 MG TABLET,EXTENDED RELEASE 24 HR
ORAL_TABLET | Freq: Every evening | ORAL | 0 refills | 30.00 days
Start: 2023-08-22 — End: 2023-09-21

## 2023-08-24 MED ORDER — DIVALPROEX ER 500 MG TABLET,EXTENDED RELEASE 24 HR
ORAL_TABLET | Freq: Every evening | ORAL | 0 refills | 30.00 days
Start: 2023-08-24 — End: 2023-09-23

## 2023-08-25 MED ORDER — DIVALPROEX ER 500 MG TABLET,EXTENDED RELEASE 24 HR
ORAL_TABLET | Freq: Every evening | ORAL | 0 refills | 30.00 days
Start: 2023-08-25 — End: 2023-09-24

## 2023-08-28 MED ORDER — DIVALPROEX ER 500 MG TABLET,EXTENDED RELEASE 24 HR
ORAL_TABLET | Freq: Every evening | ORAL | 0 refills | 30.00 days
Start: 2023-08-28 — End: 2023-09-27

## 2023-09-11 MED ORDER — DIVALPROEX ER 500 MG TABLET,EXTENDED RELEASE 24 HR
ORAL_TABLET | Freq: Every evening | ORAL | 0 refills | 30.00 days
Start: 2023-09-11 — End: 2023-10-11

## 2023-09-14 MED ORDER — DIVALPROEX ER 500 MG TABLET,EXTENDED RELEASE 24 HR
ORAL_TABLET | Freq: Every evening | ORAL | 0 refills | 30.00 days
Start: 2023-09-14 — End: 2023-10-14

## 2023-09-15 MED ORDER — DIVALPROEX ER 500 MG TABLET,EXTENDED RELEASE 24 HR
ORAL_TABLET | Freq: Every evening | ORAL | 0 refills | 30.00 days
Start: 2023-09-15 — End: 2023-10-15

## 2023-10-06 ENCOUNTER — Other Ambulatory Visit

## 2023-10-06 ENCOUNTER — Emergency Department

## 2023-10-06 ENCOUNTER — Observation Stay
Admission: EM | Admit: 2023-10-06 | Discharge: 2023-10-07 | Disposition: A | Discharge status: Home / Self Care | Attending: Internal Medicine | Admitting: Internal Medicine

## 2023-10-06 ENCOUNTER — Other Ambulatory Visit: Payer: Self-pay

## 2023-10-06 DIAGNOSIS — R4189 Other symptoms and signs involving cognitive functions and awareness: Secondary | ICD-10-CM

## 2023-10-06 DIAGNOSIS — J45909 Unspecified asthma, uncomplicated: Secondary | ICD-10-CM | POA: Diagnosis present

## 2023-10-06 DIAGNOSIS — I10 Essential (primary) hypertension: Secondary | ICD-10-CM | POA: Diagnosis not present

## 2023-10-06 DIAGNOSIS — F17200 Nicotine dependence, unspecified, uncomplicated: Secondary | ICD-10-CM | POA: Diagnosis not present

## 2023-10-06 DIAGNOSIS — R569 Unspecified convulsions: Secondary | ICD-10-CM | POA: Insufficient documentation

## 2023-10-06 DIAGNOSIS — F1090 Alcohol use, unspecified, uncomplicated: Secondary | ICD-10-CM | POA: Insufficient documentation

## 2023-10-06 DIAGNOSIS — I214 Non-ST elevation (NSTEMI) myocardial infarction: Secondary | ICD-10-CM

## 2023-10-06 DIAGNOSIS — F1290 Cannabis use, unspecified, uncomplicated: Secondary | ICD-10-CM | POA: Insufficient documentation

## 2023-10-06 DIAGNOSIS — R079 Chest pain, unspecified: Principal | ICD-10-CM

## 2023-10-06 DIAGNOSIS — E785 Hyperlipidemia, unspecified: Secondary | ICD-10-CM | POA: Diagnosis not present

## 2023-10-06 DIAGNOSIS — R7989 Other specified abnormal findings of blood chemistry: Secondary | ICD-10-CM | POA: Diagnosis not present

## 2023-10-06 DIAGNOSIS — F250 Schizoaffective disorder, bipolar type: Secondary | ICD-10-CM | POA: Diagnosis not present

## 2023-10-06 DIAGNOSIS — Z79899 Other long term (current) drug therapy: Secondary | ICD-10-CM | POA: Insufficient documentation

## 2023-10-06 DIAGNOSIS — E119 Type 2 diabetes mellitus without complications: Secondary | ICD-10-CM | POA: Diagnosis not present

## 2023-10-06 DIAGNOSIS — F259 Schizoaffective disorder, unspecified: Secondary | ICD-10-CM

## 2023-10-06 LAB — CBC
HCT: 46.9 % (ref 39.0–52.0)
Hemoglobin: 15.9 g/dL (ref 13.0–17.0)
MCH: 29.4 pg (ref 26.0–34.0)
MCHC: 33.9 g/dL (ref 30.0–36.0)
MCV: 86.7 fL (ref 80.0–100.0)
Platelets: 248 10*3/uL (ref 150–400)
RBC: 5.41 MIL/uL (ref 4.22–5.81)
RDW: 13.6 % (ref 11.5–15.5)
WBC: 13.9 10*3/uL — ABNORMAL HIGH (ref 4.0–10.5)
nRBC: 0 % (ref 0.0–0.2)

## 2023-10-06 LAB — BASIC METABOLIC PANEL
Anion gap: 8 (ref 5–15)
BUN: 14 mg/dL (ref 6–20)
CO2: 24 mmol/L (ref 22–32)
Calcium: 8.9 mg/dL (ref 8.9–10.3)
Chloride: 105 mmol/L (ref 98–111)
Creatinine, Ser: 1.12 mg/dL (ref 0.61–1.24)
GFR, Estimated: >60 mL/min (ref >60)
Glucose, Bld: 169 mg/dL — ABNORMAL HIGH (ref 70–99)
Potassium: 3.8 mmol/L (ref 3.5–5.1)
Sodium: 137 mmol/L (ref 135–145)

## 2023-10-06 LAB — HIV ANTIBODY (ROUTINE TESTING W REFLEX): HIV Screen 4th Generation wRfx: NONREACTIVE

## 2023-10-06 LAB — GLUCOSE, CAPILLARY
Glucose-Capillary: 200 mg/dL — ABNORMAL HIGH (ref 70–99)
Glucose-Capillary: 153 mg/dL — ABNORMAL HIGH (ref 70–99)

## 2023-10-06 LAB — TROPONIN I (HIGH SENSITIVITY)
Troponin I (High Sensitivity): 91 ng/L — ABNORMAL HIGH (ref <18)
Troponin I (High Sensitivity): 79 ng/L — ABNORMAL HIGH (ref <18)

## 2023-10-06 LAB — HEMOGLOBIN A1C
Hgb A1c MFr Bld: 8.6 % — ABNORMAL HIGH (ref 4.8–5.6)
Mean Plasma Glucose: 200.12 mg/dL

## 2023-10-06 LAB — CBG MONITORING, ED: Glucose-Capillary: 270 mg/dL — ABNORMAL HIGH (ref 70–99)

## 2023-10-06 MED ORDER — ATORVASTATIN CALCIUM 20 MG PO TABS: 40.0000 mg | ORAL_TABLET | Freq: Every day | ORAL | Status: DC

## 2023-10-06 MED ORDER — ONDANSETRON HCL 4 MG/2ML IJ SOLN: 4.0000 mg | Freq: Four times a day (QID) | INTRAMUSCULAR | Status: DC | PRN

## 2023-10-06 MED ORDER — ATORVASTATIN CALCIUM 20 MG PO TABS: 80.0000 mg | ORAL_TABLET | Freq: Every day | ORAL | Status: DC

## 2023-10-06 MED ORDER — INSULIN ASPART 100 UNIT/ML IJ SOLN
4.0000 [IU] | Freq: Three times a day (TID) | INTRAMUSCULAR | Status: DC
  Administered 2023-10-06 – 2023-10-07 (×2): 4 [IU] via SUBCUTANEOUS
  Filled 2023-10-06 (×2): qty 1

## 2023-10-06 MED ORDER — LORAZEPAM 0.5 MG PO TABS
0.5000 mg | ORAL_TABLET | Freq: Four times a day (QID) | ORAL | Status: DC | PRN
Start: 2023-10-06 — End: 2023-10-07
  Administered 2023-10-06: 0.5 mg via ORAL
  Filled 2023-10-06: qty 1

## 2023-10-06 MED ORDER — DIVALPROEX SODIUM ER 250 MG PO TB24
1500.0000 mg | ORAL_TABLET | Freq: Every day | ORAL | Status: DC
  Administered 2023-10-06: 1500 mg via ORAL
  Filled 2023-10-06: qty 6

## 2023-10-06 MED ORDER — ENOXAPARIN SODIUM 60 MG/0.6ML IJ SOSY
50.0000 mg | PREFILLED_SYRINGE | INTRAMUSCULAR | Status: DC
  Administered 2023-10-06: 50 mg via SUBCUTANEOUS
  Filled 2023-10-06: qty 0.6

## 2023-10-06 MED ORDER — ARIPIPRAZOLE 15 MG PO TABS
15.0000 mg | ORAL_TABLET | Freq: Every day | ORAL | Status: DC
Start: 2023-10-06 — End: 2023-10-07
  Administered 2023-10-06 – 2023-10-07 (×2): 15 mg via ORAL
  Filled 2023-10-06 (×2): qty 1

## 2023-10-06 MED ORDER — HYDRALAZINE HCL 20 MG/ML IJ SOLN
10.0000 mg | INTRAMUSCULAR | Status: DC | PRN
  Administered 2023-10-06 – 2023-10-07 (×2): 10 mg via INTRAVENOUS
  Filled 2023-10-06: qty 1

## 2023-10-06 MED ORDER — ASPIRIN 81 MG PO TBEC
81.0000 mg | DELAYED_RELEASE_TABLET | Freq: Every day | ORAL | Status: DC
  Administered 2023-10-07: 81 mg via ORAL
  Filled 2023-10-06: qty 1

## 2023-10-06 MED ORDER — AMLODIPINE BESYLATE 10 MG PO TABS
10.0000 mg | ORAL_TABLET | Freq: Every day | ORAL | Status: DC
  Administered 2023-10-06 – 2023-10-07 (×2): 10 mg via ORAL
  Filled 2023-10-06: qty 2
  Filled 2023-10-06: qty 1

## 2023-10-06 MED ORDER — ENOXAPARIN SODIUM 40 MG/0.4ML IJ SOSY: 40.0000 mg | PREFILLED_SYRINGE | INTRAMUSCULAR | Status: DC

## 2023-10-06 MED ORDER — INSULIN ASPART 100 UNIT/ML IJ SOLN
0.0000 [IU] | Freq: Three times a day (TID) | INTRAMUSCULAR | Status: DC
  Administered 2023-10-06: 5 [IU] via SUBCUTANEOUS
  Administered 2023-10-07: 2 [IU] via SUBCUTANEOUS
  Filled 2023-10-06 (×2): qty 1

## 2023-10-06 MED ORDER — ACETAMINOPHEN 325 MG PO TABS
650.0000 mg | ORAL_TABLET | ORAL | Status: DC | PRN
  Administered 2023-10-06 – 2023-10-07 (×4): 650 mg via ORAL
  Filled 2023-10-06 (×4): qty 2

## 2023-10-06 MED ORDER — NICOTINE 21 MG/24HR TD PT24
21.0000 mg | MEDICATED_PATCH | Freq: Every day | TRANSDERMAL | Status: DC
  Administered 2023-10-06 – 2023-10-07 (×2): 21 mg via TRANSDERMAL
  Filled 2023-10-06 (×2): qty 1

## 2023-10-06 MED ORDER — FLUTICASONE PROPIONATE 50 MCG/ACT NA SUSP
1.0000 | Freq: Every day | NASAL | Status: DC
  Administered 2023-10-07: 1 via NASAL
  Filled 2023-10-06 (×2): qty 16

## 2023-10-06 MED ORDER — HYDRALAZINE HCL 20 MG/ML IJ SOLN
5.0000 mg | Freq: Once | INTRAMUSCULAR | Status: AC
  Administered 2023-10-06: 5 mg via INTRAVENOUS
  Filled 2023-10-06: qty 1

## 2023-10-06 MED ORDER — IPRATROPIUM-ALBUTEROL 0.5-2.5 (3) MG/3ML IN SOLN
3.0000 mL | RESPIRATORY_TRACT | Status: DC | PRN
  Administered 2023-10-06 – 2023-10-07 (×2): 3 mL via RESPIRATORY_TRACT
  Filled 2023-10-06 (×2): qty 3

## 2023-10-06 NOTE — ED Notes (Signed)
 Snack given.

## 2023-10-06 NOTE — Assessment & Plan Note (Signed)
 Stable from a resp standpoint  Cont home inhalers

## 2023-10-06 NOTE — ED Notes (Signed)
Pt assisted to toilet 
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
## 2023-10-06 NOTE — ED Notes (Addendum)
 Pt eating lunch tray at this time

## 2023-10-06 NOTE — ED Provider Notes (Signed)
 [facility] Provider Note    Event Date/Time   First MD Initiated Contact with Patient 10/06/23 1151     (approximate)   History   Chest Pain   HPI  Henry Shaffer is a 48 y.o. male with history of schizophrenia, hypertension, hyperlipidemia, and diabetes who presents with chest pain, acute onset last night, persistent course since then until EMS gave him medication.  The patient describes the pain as pressure or squeezing, in the middle of his chest, and radiating towards his back.  It is now completely resolved.  He denies shortness of breath.  I reviewed the past medical records.  The patient's most recent documented outpatient visit is with psychiatry at [facility] on 12/2 for follow-up of his chronic conditions.  He has no recent ED visits or hospitalizations.  Reviewing his past records from [facility], he had a cardiac catheterization in 2018 that showed minimal nonobstructive coronary disease.  He last had an echo in 2023 that showed normal LVEF.   Physical Exam   Triage Vital Signs: ED Triage Vitals  Encounter Vitals Group     BP 10/06/23 1120 (!) 181/129     Systolic BP Percentile --      Diastolic BP Percentile --      Pulse Rate 10/06/23 1120 92     Resp 10/06/23 1120 19     Temp 10/06/23 1120 97.8 F (36.6 C)     Temp src --      SpO2 10/06/23 1120 98 %     Weight 10/06/23 1119 235 lb (106.6 kg)     Height 10/06/23 1119 5\' 11"  (1.803 m)     Head Circumference --      Peak Flow --      Pain Score 10/06/23 1119 0     Pain Loc --      Pain Education --      Exclude from Growth Chart --     Most recent vital signs: Vitals:   10/06/23 1214 10/06/23 1307  BP: (!) 160/128 (!) 185/111  Pulse: 72 91  Resp: 18 17  Temp:    SpO2: 97% 97%    General: Awake, no distress.  CV:  Good peripheral perfusion.  Resp:  Normal effort.  Lungs CTAB. Abd:  No distention.  Other:  No peripheral edema.   ED Results / Procedures / Treatments   Labs (all  labs ordered are listed, but only abnormal results are displayed) Labs Reviewed  BASIC METABOLIC PANEL - Abnormal; Notable for the following components:      Result Value   Glucose, Bld 169 (*)    All other components within normal limits  CBC - Abnormal; Notable for the following components:   WBC 13.9 (*)    All other components within normal limits  TROPONIN I (HIGH SENSITIVITY) - Abnormal; Notable for the following components:   Troponin I (High Sensitivity) 91 (*)    All other components within normal limits  TROPONIN I (HIGH SENSITIVITY)     EKG  ED ECG REPORT I, Dionne Bucy, the attending physician, personally viewed and interpreted this ECG.  Date: 10/06/2023 EKG Time: 1118 Rate: 86  Rhythm: normal sinus rhythm QRS Axis: normal Intervals: RBBB ST/T Wave abnormalities: Nonspecific ST abnormalities Narrative Interpretation: no evidence of acute ischemia    RADIOLOGY  Chest x-ray: I independently viewed and interpreted the images; there is no focal consolidation or edema  PROCEDURES:  Critical Care performed: No  Procedures  MEDICATIONS ORDERED IN ED: Medications  hydrALAZINE (APRESOLINE) injection 5 mg (5 mg Intravenous Given 10/06/23 1232)     IMPRESSION / MDM / ASSESSMENT AND PLAN / ED COURSE  I reviewed the triage vital signs and the nursing notes.  48 year old male with PMH as noted above including a prior MI presents with chest pain since last night, now resolved after he got aspirin and nitro from EMS.  On exam his blood pressure is elevated but other vital signs are normal.  Physical exam is otherwise unremarkable for acute findings.  Differential diagnosis includes, but is not limited to, ACS, musculoskeletal pain, GERD, CHF, hypertensive crisis.  There is no clinical evidence for PE given the lack of tachycardia or hypoxia.  Similarly there is no clinical evidence for aortic dissection or other vascular etiology given that the symptoms have  now completely resolved.  We will obtain lab workup including cardiac enzymes, give hydralazine for the hypertension, and reassess.  Patient's presentation is most consistent with acute presentation with potential threat to life or bodily function.  The patient is on the cardiac monitor to evaluate for evidence of arrhythmia and/or significant heart rate changes.  ----------------------------------------- 1:35 PM on 10/06/2023 -----------------------------------------  Initial troponin is elevated.  CBC shows mild leukocytosis.  BMP is unremarkable.  The patient will need further cardiac workup.  I consulted Dr. Alvester Morin from the hospitalist service; based on our discussion he agrees to evaluate the patient for admission.   FINAL CLINICAL IMPRESSION(S) / ED DIAGNOSES   Final diagnoses:  Elevated troponin  Chest pain, unspecified type     Rx / DC Orders   ED Discharge Orders     None        Note:  This document was prepared using Dragon voice recognition software and may include unintentional dictation errors.    Dionne Bucy, MD 10/06/23 1336

## 2023-10-06 NOTE — Assessment & Plan Note (Signed)
 2+ pack/day smoker  Discussed cessation  Nicotine patch  Follow
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
## 2023-10-06 NOTE — Assessment & Plan Note (Addendum)
 Recurrent central chest pain with associated nausea in setting history of type II MI in the [facility] system in 2018 (05/2017 LHC demonstrated minimal non obstructive coronary artery disease) Troponin in the 90s EKG grossly stable Partial compliance with home medications  Status post full dose aspirin and nitroglycerin Will trend troponin Risk stratification labs 2D echo HEART score 5+  Will formally consult cardiology for further evaluation Follow up recommendations

## 2023-10-06 NOTE — ED Triage Notes (Signed)
 Pt comes with c/o cp that started last night. Pt states mid sternal cp. Pt states it was taking his breath away. Pt states pain all in his back. Pt states he was given shot and it lowered his BP and pain went away.

## 2023-10-06 NOTE — Progress Notes (Signed)
 PHARMACIST - PHYSICIAN COMMUNICATION  CONCERNING:  Enoxaparin (Lovenox) for DVT Prophylaxis    RECOMMENDATION: Patient was prescribed enoxaprin 40mg  q24 hours for VTE prophylaxis.   Filed Weights   10/06/23 1119  Weight: 106.6 kg (235 lb)    Body mass index is 32.78 kg/m.  Estimated Creatinine Clearance: 99.1 mL/min (by C-G formula based on SCr of 1.12 mg/dL).   Based on [facility] policy patient is candidate for enoxaparin 0.5mg /kg TBW SQ every 24 hours based on BMI being >30.   DESCRIPTION: Pharmacy has adjusted enoxaparin dose per [facility] policy.  Patient is now receiving enoxaparin 50 mg every 24 hours    Merryl Hacker, PharmD Clinical Pharmacist  10/06/2023 2:20 PM

## 2023-10-06 NOTE — Progress Notes (Signed)
       CROSS COVER NOTE  NAME: Henry Shaffer MRN: [id number] DOB : 04/23/1976 ATTENDING PHYSICIAN: Floydene Flock, MD    Date of Service   10/06/2023   HPI/Events of Note   Nurse reports patietn c/o nasal stuffiness and requesting flonase  Interventions   Assessment/Plan: Flonase ordered        Donnie Mesa NP [facility] Cross Cover 7pm-7am - check amion for availability Pager [phone]

## 2023-10-06 NOTE — ED Notes (Signed)
 Pt states "help, I can't breath." Reassurance given that oxygen saturation is normal and pt is able to breathe if speaking normally. Pt is AOX4, NAD noted, respirations even and unlabored at this time, lung sounds clear.

## 2023-10-06 NOTE — ED Notes (Signed)
 Delay in triage due to pt having a BM on arrival to ED via [facility].   First nurse note: Pt here via [facility] from home with CP that started 12 hours ago,. Pt states cardiac HX, MI a few years ago. HX of HTN, 220/116, HX of same not taking meds.   324 ASA given 1 in nitroglycerin paste HR: 84 98% RA Co2: 40  CBG 147

## 2023-10-06 NOTE — ED Notes (Signed)
 Pt eating dinner tray at this time

## 2023-10-06 NOTE — Assessment & Plan Note (Signed)
 Blood sugar 160s  SSI  A1C  Monitor

## 2023-10-06 NOTE — Assessment & Plan Note (Signed)
 160s-180s/100s  Partial compliance w/ home regimen  Titrate BP regimen  Monitor

## 2023-10-07 ENCOUNTER — Observation Stay (HOSPITAL_BASED_OUTPATIENT_CLINIC_OR_DEPARTMENT_OTHER)
Admit: 2023-10-07 | Discharge: 2023-10-07 | Disposition: A | Discharge status: Home / Self Care | Attending: Family Medicine | Admitting: Family Medicine

## 2023-10-07 DIAGNOSIS — R079 Chest pain, unspecified: Secondary | ICD-10-CM

## 2023-10-07 LAB — ECHOCARDIOGRAM COMPLETE
AR max vel: 2.32 cm2
AV Area VTI: 2.78 cm2
AV Area mean vel: 2.37 cm2
AV Mean grad: 5 mmHg
AV Peak grad: 10.4 mmHg
Ao pk vel: 1.61 m/s
Area-P 1/2: 2.96 cm2
Height: 71 in
MV VTI: 4.1 cm2
S' Lateral: 4 cm
Weight: 3760 [oz_av]

## 2023-10-07 LAB — GLUCOSE, CAPILLARY
Glucose-Capillary: 169 mg/dL — ABNORMAL HIGH (ref 70–99)
Glucose-Capillary: 304 mg/dL — ABNORMAL HIGH (ref 70–99)

## 2023-10-07 LAB — LIPID PANEL
Cholesterol: 153 mg/dL (ref 0–200)
HDL: 31 mg/dL — ABNORMAL LOW (ref >40)
LDL Cholesterol: 93 mg/dL (ref 0–99)
Total CHOL/HDL Ratio: 4.9 ratio
Triglycerides: 144 mg/dL (ref <150)
VLDL: 29 mg/dL (ref 0–40)

## 2023-10-07 MED ORDER — TRAZODONE HCL 50 MG PO TABS
50.0000 mg | ORAL_TABLET | Freq: Every day | ORAL | Status: DC
Start: 2023-10-07 — End: 2023-10-07

## 2023-10-07 MED ORDER — GABAPENTIN 300 MG PO CAPS
300.0000 mg | ORAL_CAPSULE | Freq: Two times a day (BID) | ORAL | Status: DC
  Administered 2023-10-07: 300 mg via ORAL
  Filled 2023-10-07: qty 1

## 2023-10-07 MED ORDER — METOPROLOL SUCCINATE ER 50 MG PO TB24
50.0000 mg | ORAL_TABLET | Freq: Every day | ORAL | Status: DC
  Administered 2023-10-07: 50 mg via ORAL
  Filled 2023-10-07: qty 1

## 2023-10-07 NOTE — Care Management Obs Status (Signed)
 MEDICARE OBSERVATION STATUS NOTIFICATION   Patient Details  Name: Henry Shaffer MRN: [id number] Date of Birth: 26-Mar-1976   Medicare Observation Status Notification Given:  Yes Checked with RN - due to cognitive disorder, forgetfulness, and comprehension - reviewed with primary contact/brother [name] by phone. Hard copy provided in DC packet.   Nieshia Larmon E Sulamita Lafountain, LCSW 10/07/2023, 12:36 PM

## 2023-10-07 NOTE — Progress Notes (Signed)
*  PRELIMINARY RESULTS* Echocardiogram 2D Echocardiogram has been performed.  Henry Shaffer 10/07/2023, 1:19 PM
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
## 2023-10-07 NOTE — Progress Notes (Signed)
 pt's DBP elevated, current BP is 168/112, his BP is elevated around 0400, it was 175/107, gave him PRN hydralazine 10 mg IV at that time. pt's BP has been elevated since admission. pt is asymptomatic, other VSS. Pt came in for chest pain. He does have scheduled norvasc at 1000. per pt, he stop taking his medication around October 09, 2023. when his mom died. Awaiting provider's response.

## 2023-10-07 NOTE — Plan of Care (Signed)
  Problem: Coping: Goal: Ability to adjust to condition or change in health will improve Outcome: Progressing   Problem: Fluid Volume: Goal: Ability to maintain a balanced intake and output will improve Outcome: Progressing   Problem: Health Behavior/Discharge Planning: Goal: Ability to identify and utilize available resources and services will improve Outcome: Progressing Goal: Ability to manage health-related needs will improve Outcome: Progressing

## 2023-10-07 NOTE — Discharge Summary (Addendum)
 Physician Discharge Summary   Patient: Henry Shaffer MRN: [id number] DOB: 1976/05/27  Admit date:     10/06/2023  Discharge date: 10/07/23  Discharge Physician: Lurene Shadow   PCP: Hayden Pedro, MD   Recommendations at discharge:   Outpatient follow-up with Dr. Clotilde Dieter, cardiologist, for stress test recommended Follow-up with PCP for routine health maintenance  Discharge Diagnoses: Principal Problem:   Chest pain Active Problems:   Elevated troponin   HTN (hypertension)   Diabetes (HCC)   Tobacco use disorder   Asthma   Schizoaffective disorder, bipolar type (HCC)  Resolved Problems:   * No resolved hospital problems. [facility] Course:  Mr. Henry Shaffer is a 48 year old man with medical history significant for schizophrenia, cognitive disorder, anxiety, hypertension, hyperlipidemia, type II DM, gout, history of left Achilles tendon injury (about 15 years ago) with resultant chronic left foot swelling, who presented to the hospital with chest pain.  He said chest pain was located in the midsternal region and it lasted up for about a day.  Chest pain radiated to the mid back.  He said his mother passed away about a month ago and he has been feeling very stressed since then.  He said he has not been taking his blood pressure medicines for about a month because he was "down and out".  However, he said that he is starting to feel better and he will start taking his medicines.  He thinks he may be out of metoprolol". No shortness of breath, abdominal pain, vomiting, palpitations, dizziness.  He also complained of low back pain that radiates down to the buttocks and the legs.  He attributes this to "sciatic nerve".  He said he lifted a heavy object about a month prior to admission and has been having this pain since then. Troponins were minimally elevated on admission (91, 79).  He was also found to have hypertensive urgency.  Elevated troponins were attributed to  demand ischemia.  ACS has been ruled out.  He was admitted to the hospital for observation. Currently, chest pain has resolved.  Cardiologist was consulted to assist with management.  Unfortunately, patient said that he felt better and wanted to go home.  Case was discussed with Dr. Melton Alar, cardiologist.  She recommended outpatient stress test.  It was recommended that he wait for 2D echo to assess LV function prior to discharge from the hospital.  However, patient insisted on leaving the hospital immediately.  The risk of leaving AGAINST MEDICAL ADVICE including but not limited to acute myocardial infarction, cardiopulmonary arrest, sudden cardiac death, were explained to the patient.  He was adamant about going home today.  He said his brother was going to pick him up.  I called the [name], his brother, on the phone and there was no response and his voicemail box was full. [place], RN, was at the bedside during this encounter.          Consultants: Cardiologist Procedures performed: None Disposition: Home (he left AGAINST MEDICAL ADVICE) Diet recommendation:   DISCHARGE MEDICATION:  Discharge Exam: Filed Weights   10/06/23 1119  Weight: 106.6 kg   GEN: NAD SKIN: Warm and dry EYES: No pallor or icterus ENT: MMM CV: RRR PULM: CTA B ABD: soft, obese, NT, +BS CNS: AAO x 3, non focal EXT: Swelling of the distal left leg, left foot.  No erythema or tenderness.   Condition at discharge: stable  The results of significant diagnostics from this hospitalization (including imaging, microbiology,  ancillary and laboratory) are listed below for reference.   Imaging Studies: DG Chest 2 View Result Date: 10/06/2023 CLINICAL DATA:  Chest pain EXAM: CHEST - 2 VIEW COMPARISON:  09/09/2019 FINDINGS: The heart size and mediastinal contours are within normal limits. No focal airspace consolidation, pleural effusion, or pneumothorax. The visualized skeletal structures are unremarkable.  IMPRESSION: No active cardiopulmonary disease. Electronically Signed   By: Duanne Guess D.O.   On: 10/06/2023 13:48    Microbiology: Results for orders placed or performed during the hospital encounter of 04/28/19  SARS Coronavirus 2 [facility] order, Performed in [facility] hospital lab) Nasopharyngeal Nasopharyngeal Swab     Status: None   Collection Time: 04/29/19  6:18 AM   Specimen: Nasopharyngeal Swab  Result Value Ref Range Status   SARS Coronavirus 2 NEGATIVE NEGATIVE Final    Comment: (NOTE) If result is NEGATIVE SARS-CoV-2 target nucleic acids are NOT DETECTED. The SARS-CoV-2 RNA is generally detectable in upper and lower  respiratory specimens during the acute phase of infection. The lowest  concentration of SARS-CoV-2 viral copies this assay can detect is 250  copies / mL. A negative result does not preclude SARS-CoV-2 infection  and should not be used as the sole basis for treatment or other  patient management decisions.  A negative result may occur with  improper specimen collection / handling, submission of specimen other  than nasopharyngeal swab, presence of viral mutation(s) within the  areas targeted by this assay, and inadequate number of viral copies  (<250 copies / mL). A negative result must be combined with clinical  observations, patient history, and epidemiological information. If result is POSITIVE SARS-CoV-2 target nucleic acids are DETECTED. The SARS-CoV-2 RNA is generally detectable in upper and lower  respiratory specimens dur ing the acute phase of infection.  Positive  results are indicative of active infection with SARS-CoV-2.  Clinical  correlation with patient history and other diagnostic information is  necessary to determine patient infection status.  Positive results do  not rule out bacterial infection or co-infection with other viruses. If result is PRESUMPTIVE POSTIVE SARS-CoV-2 nucleic acids MAY BE PRESENT.   A presumptive positive  result was obtained on the submitted specimen  and confirmed on repeat testing.  While 2019 novel coronavirus  (SARS-CoV-2) nucleic acids may be present in the submitted sample  additional confirmatory testing may be necessary for epidemiological  and / or clinical management purposes  to differentiate between  SARS-CoV-2 and other Sarbecovirus currently known to infect humans.  If clinically indicated additional testing with an alternate test  methodology [phone]) is advised. The SARS-CoV-2 RNA is generally  detectable in upper and lower respiratory sp ecimens during the acute  phase of infection. The expected result is Negative. Fact Sheet for Patients:  BoilerBrush.com.cy Fact Sheet for Healthcare Providers: https://pope.com/ This test is not yet approved or cleared by the [place] FDA and has been authorized for detection and/or diagnosis of SARS-CoV-2 by FDA under an Emergency Use Authorization (EUA).  This EUA will remain in effect (meaning this test can be used) for the duration of the COVID-19 declaration under Section 564(b)(1) of the Act, 21 U.S.C. section 360bbb-3(b)(1), unless the authorization is terminated or revoked sooner. Performed at [facility], [address] [place], [place] [address]     Labs: CBC: Recent Labs  Lab 10/06/23 1129  WBC 13.9*  HGB 15.9  HCT 46.9  MCV 86.7  PLT 248   Basic Metabolic Panel: Recent Labs  Lab  10/06/23 1129  NA 137  K 3.8  CL 105  CO2 24  GLUCOSE 169*  BUN 14  CREATININE 1.12  CALCIUM 8.9   Liver Function Tests: No results for input(s): "AST", "ALT", "ALKPHOS", "BILITOT", "PROT", "ALBUMIN" in the last 168 hours. CBG: Recent Labs  Lab 10/06/23 1605 10/06/23 1713 10/06/23 2003 10/07/23 0735 10/07/23 1158  GLUCAP 270* 200* 153* 169* 304*    Discharge time spent: greater than 30 minutes.  Signed: Lurene Shadow, MD [facility] 10/07/2023

## 2023-10-07 NOTE — TOC Initial Note (Signed)
 Transition of Care [facility]) - Initial/Assessment Note    Patient Details  Name: Henry Shaffer MRN: [id number] Date of Birth: Sep 27, 1975  [facility] of [facility]) CM/SW Contact:    Liliana Cline, LCSW Phone Number: 10/07/2023, 12:37 PM  Clinical Narrative:                 CSW spoke with patient's brother [name] by phone. Patient lives with [name] who provides transportation. PCP is Dr. Judi Cong. [facility]. Patient has no DME at home. [name] states he can pick patient up today at DC and denies TOC needs.  Expected Discharge Plan: Home/Self Care Barriers to Discharge: Barriers Resolved   Patient Goals and CMS Choice   CMS Medicare.gov Compare Post Acute Care list provided to:: Patient Represenative (must comment) Choice offered to / list presented to : Sibling      Expected Discharge Plan and Services       Living arrangements for the past 2 months: Single Family Home                                      Prior Living Arrangements/Services Living arrangements for the past 2 months: Single Family Home Lives with:: Siblings Patient language and need for interpreter reviewed:: Yes        Need for Family Participation in Patient Care: Yes (Comment) Care giver support system in place?: Yes (comment)   Criminal Activity/Legal Involvement Pertinent to Current Situation/Hospitalization: No - Comment as needed  Activities of Daily Living      Permission Sought/Granted                  Emotional Assessment       Orientation: : Fluctuating Orientation (Suspected and/or reported Sundowners) Alcohol / Substance Use: Not Applicable Psych Involvement: No (comment)  Admission diagnosis:  Elevated troponin [R79.89] Chest pain [R07.9] Chest pain, unspecified type [R07.9] Patient Active Problem List   Diagnosis Date Noted   Chest pain 10/06/2023   NSTEMI (non-ST elevated myocardial infarction) (HCC) 10/06/2023   Hypertensive  urgency 11/07/2017   Schizoaffective disorder, depressive type (HCC) 07/22/2017   Asthma 02/14/2017   HTN (hypertension) 02/13/2017   Dyslipidemia 02/13/2017   Diabetes (HCC) 02/13/2017   Tobacco use disorder 02/13/2017   Schizoaffective disorder, bipolar type (HCC) 06/29/2012   Decreased vision 05/08/2012   Conductive hearing loss, middle ear 11/02/2011   PCP:  Hayden Pedro, MD Pharmacy:   [phone] [place] [place] - [address] [place] [address] Phone: [phone] Fax: [phone]     Social Drivers of Health (SDOH) Social History: SDOH Screenings   Food Insecurity: No Food Insecurity (10/07/2023)  Housing: Patient Declined (10/07/2023)  Transportation Needs: Patient Declined (10/07/2023)  Utilities: Patient Declined (10/07/2023)  Alcohol Screen: Low Risk  (07/22/2017)  Financial Resource Strain: Low Risk  (04/07/2023)   [facility] from [facility]  Tobacco Use: High Risk (10/06/2023)  Health Literacy: High Risk (11/26/2021)   Received from [facility], [facility]   SDOH Interventions:     Readmission Risk Interventions     No data to display

## 2023-10-07 NOTE — Plan of Care (Signed)
   Problem: Education: Goal: Ability to describe self-care measures that may prevent or decrease complications (Diabetes Survival Skills Education) will improve Outcome: Not Progressing Goal: Individualized Educational Video(s) Outcome: Not Progressing   Problem: Coping: Goal: Ability to adjust to condition or change in health will improve Outcome: Not Progressing

## 2023-11-01 ENCOUNTER — Ambulatory Visit
Admit: 2023-11-01 | Discharge: 2023-11-02 | Attending: Student in an Organized Health Care Education/Training Program | Primary: Student in an Organized Health Care Education/Training Program

## 2023-11-01 DIAGNOSIS — M5431 Sciatica, right side: Principal | ICD-10-CM

## 2023-11-01 DIAGNOSIS — H9193 Unspecified hearing loss, bilateral: Principal | ICD-10-CM

## 2023-11-01 DIAGNOSIS — M7662 Achilles tendinitis, left leg: Principal | ICD-10-CM

## 2023-11-01 MED ORDER — MELOXICAM 15 MG TABLET
ORAL_TABLET | Freq: Every day | ORAL | 0 refills | 30.00 days | Status: CP
Start: 2023-11-01 — End: 2023-12-01

## 2023-11-01 MED ORDER — GABAPENTIN 300 MG CAPSULE
ORAL_CAPSULE | Freq: Two times a day (BID) | ORAL | 3 refills | 45.00 days | Status: CP
Start: 2023-11-01 — End: ?

## 2024-01-23 DIAGNOSIS — R0602 Shortness of breath: Principal | ICD-10-CM

## 2024-01-23 DIAGNOSIS — I2510 Atherosclerotic heart disease of native coronary artery without angina pectoris: Principal | ICD-10-CM

## 2024-01-23 DIAGNOSIS — E1100 Type 2 diabetes mellitus with hyperosmolarity without nonketotic hyperglycemic-hyperosmolar coma (NKHHC): Principal | ICD-10-CM

## 2024-01-23 DIAGNOSIS — I10 Essential (primary) hypertension: Principal | ICD-10-CM

## 2024-01-23 DIAGNOSIS — F250 Schizoaffective disorder, bipolar type: Principal | ICD-10-CM

## 2024-01-23 MED ORDER — AMLODIPINE 10 MG TABLET
ORAL_TABLET | Freq: Every day | ORAL | 2 refills | 90.00000 days | Status: CP
Start: 2024-01-23 — End: 2024-11-19

## 2024-01-23 MED ORDER — DIVALPROEX ER 500 MG TABLET,EXTENDED RELEASE 24 HR
ORAL_TABLET | Freq: Every evening | ORAL | 0 refills | 30.00000 days | Status: CP
Start: 2024-01-23 — End: 2024-02-22

## 2024-01-23 MED ORDER — ARIPIPRAZOLE 15 MG TABLET
ORAL_TABLET | Freq: Every day | ORAL | 0 refills | 30.00000 days | Status: CP
Start: 2024-01-23 — End: 2024-02-22

## 2024-01-23 MED ORDER — FLUTICASONE FUROATE 100 MCG-VILANTEROL 25 MCG/DOSE INHALATION POWDER
Freq: Every day | RESPIRATORY_TRACT | 11 refills | 1.00000 days | Status: CP
Start: 2024-01-23 — End: ?

## 2024-01-23 MED ORDER — ALBUTEROL SULFATE HFA 90 MCG/ACTUATION AEROSOL INHALER
RESPIRATORY_TRACT | 3 refills | 0.00000 days | Status: CP | PRN
Start: 2024-01-23 — End: ?

## 2024-01-23 MED ORDER — LISINOPRIL 40 MG TABLET
ORAL_TABLET | Freq: Every day | ORAL | 0 refills | 30.00000 days | Status: CP
Start: 2024-01-23 — End: 2024-02-22

## 2024-01-23 MED ORDER — GLIPIZIDE 5 MG TABLET
ORAL_TABLET | Freq: Two times a day (BID) | ORAL | 2 refills | 90.00000 days | Status: CP
Start: 2024-01-23 — End: ?

## 2024-01-23 MED ORDER — METOPROLOL SUCCINATE ER 50 MG TABLET,EXTENDED RELEASE 24 HR
ORAL_TABLET | Freq: Every day | ORAL | 11 refills | 30.00000 days | Status: CP
Start: 2024-01-23 — End: 2025-01-22

## 2024-01-23 MED ORDER — EMPAGLIFLOZIN 10 MG TABLET
ORAL_TABLET | Freq: Every morning | ORAL | 0 refills | 30.00000 days | Status: CP
Start: 2024-01-23 — End: 2024-02-22

## 2024-01-29 ENCOUNTER — Encounter
Admit: 2024-01-29 | Discharge: 2024-01-29 | Payer: Medicare (Managed Care) | Attending: Student in an Organized Health Care Education/Training Program | Primary: Student in an Organized Health Care Education/Training Program

## 2024-01-29 DIAGNOSIS — J349 Unspecified disorder of nose and nasal sinuses: Principal | ICD-10-CM

## 2024-01-29 DIAGNOSIS — M6740 Ganglion, unspecified site: Principal | ICD-10-CM

## 2024-01-29 DIAGNOSIS — F250 Schizoaffective disorder, bipolar type: Principal | ICD-10-CM

## 2024-01-29 DIAGNOSIS — I10 Essential (primary) hypertension: Principal | ICD-10-CM

## 2024-01-29 DIAGNOSIS — I2510 Atherosclerotic heart disease of native coronary artery without angina pectoris: Principal | ICD-10-CM

## 2024-01-29 DIAGNOSIS — E1100 Type 2 diabetes mellitus with hyperosmolarity without nonketotic hyperglycemic-hyperosmolar coma (NKHHC): Principal | ICD-10-CM

## 2024-01-29 DIAGNOSIS — E66811 Obesity, class 1: Principal | ICD-10-CM

## 2024-01-29 DIAGNOSIS — Z1211 Encounter for screening for malignant neoplasm of colon: Principal | ICD-10-CM

## 2024-01-29 DIAGNOSIS — R0602 Shortness of breath: Principal | ICD-10-CM

## 2024-01-29 DIAGNOSIS — M7662 Achilles tendinitis, left leg: Principal | ICD-10-CM

## 2024-01-29 DIAGNOSIS — F17200 Nicotine dependence, unspecified, uncomplicated: Principal | ICD-10-CM

## 2024-01-29 DIAGNOSIS — H7193 Unspecified cholesteatoma, bilateral: Principal | ICD-10-CM

## 2024-01-29 MED ORDER — EMPAGLIFLOZIN 10 MG TABLET
ORAL_TABLET | Freq: Every morning | ORAL | 0 refills | 30.00000 days | Status: CP
Start: 2024-01-29 — End: 2024-02-28

## 2024-01-29 MED ORDER — METOPROLOL SUCCINATE ER 50 MG TABLET,EXTENDED RELEASE 24 HR
ORAL_TABLET | Freq: Every day | ORAL | 11 refills | 30.00000 days | Status: CP
Start: 2024-01-29 — End: 2025-01-28

## 2024-01-29 MED ORDER — NICOTINE (POLACRILEX) 4 MG GUM
BUCCAL | 3 refills | 5.00000 days | Status: CP | PRN
Start: 2024-01-29 — End: ?

## 2024-01-29 MED ORDER — METFORMIN 1,000 MG TABLET
ORAL_TABLET | Freq: Two times a day (BID) | ORAL | 0 refills | 30.00000 days | Status: CP
Start: 2024-01-29 — End: 2024-02-28

## 2024-01-29 MED ORDER — LISINOPRIL 40 MG TABLET
ORAL_TABLET | Freq: Every day | ORAL | 0 refills | 30.00000 days | Status: CP
Start: 2024-01-29 — End: 2024-02-28

## 2024-01-29 MED ORDER — ARIPIPRAZOLE 15 MG TABLET
ORAL_TABLET | Freq: Every day | ORAL | 0 refills | 30.00000 days | Status: CP
Start: 2024-01-29 — End: 2024-02-28

## 2024-01-29 MED ORDER — ATORVASTATIN 80 MG TABLET
ORAL_TABLET | Freq: Every day | ORAL | 0 refills | 30.00000 days | Status: CP
Start: 2024-01-29 — End: 2024-02-28

## 2024-01-29 MED ORDER — AMLODIPINE 10 MG TABLET
ORAL_TABLET | Freq: Every day | ORAL | 2 refills | 90.00000 days | Status: CP
Start: 2024-01-29 — End: 2024-11-25

## 2024-01-29 MED ORDER — BLOOD-GLUCOSE METER KIT WRAPPER
ORAL | 0 refills | 0.00000 days | Status: CP
Start: 2024-01-29 — End: 2025-01-28

## 2024-01-29 MED ORDER — ACCU-CHEK GUIDE TEST STRIPS
ORAL_STRIP | ORAL | 3 refills | 0.00000 days | Status: CP
Start: 2024-01-29 — End: ?

## 2024-01-29 MED ORDER — LANCETS
3 refills | 0.00000 days | Status: CP
Start: 2024-01-29 — End: ?

## 2024-01-29 MED ORDER — NICOTINE 21 MG/24 HR DAILY TRANSDERMAL PATCH
MEDICATED_PATCH | TRANSDERMAL | 2 refills | 14.00000 days | Status: CP
Start: 2024-01-29 — End: ?

## 2024-01-29 MED ORDER — COMPRESSION SOCKS, LARGE
ORAL | 11 refills | 0.00000 days | Status: CP
Start: 2024-01-29 — End: ?

## 2024-01-29 MED ORDER — DIVALPROEX ER 500 MG TABLET,EXTENDED RELEASE 24 HR
ORAL_TABLET | Freq: Every evening | ORAL | 0 refills | 30.00000 days | Status: CP
Start: 2024-01-29 — End: 2024-02-28

## 2024-01-29 MED ORDER — GABAPENTIN 300 MG CAPSULE
ORAL_CAPSULE | Freq: Two times a day (BID) | ORAL | 3 refills | 45.00000 days | Status: CP
Start: 2024-01-29 — End: ?

## 2024-01-29 MED ORDER — FLUTICASONE FUROATE 100 MCG-VILANTEROL 25 MCG/DOSE INHALATION POWDER
Freq: Every day | RESPIRATORY_TRACT | 11 refills | 1.00000 days | Status: CP
Start: 2024-01-29 — End: ?

## 2024-01-29 MED ORDER — GLIPIZIDE 5 MG TABLET
ORAL_TABLET | Freq: Two times a day (BID) | ORAL | 2 refills | 90.00000 days | Status: CP
Start: 2024-01-29 — End: ?

## 2024-03-08 ENCOUNTER — Emergency Department
Admit: 2024-03-08 | Discharge: 2024-03-08 | Disposition: A | Discharge status: Home / Self Care | Payer: Medicare (Managed Care)

## 2024-03-08 DIAGNOSIS — R45851 Suicidal ideations: Principal | ICD-10-CM

## 2024-03-08 NOTE — ED Triage Notes (Signed)
 Patient reports discord at home with brother. Mother was guardian but died in 09-09-2023. Patient tearful when discussing mom. Denies current SI/HI/AVH but states last night after argument with brother he had SI with a plan to overdose on metformin . 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 Has no taken meds last night or this morning.
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
## 2024-03-11 ENCOUNTER — Ambulatory Visit: Admit: 2024-03-11 | Payer: Medicare (Managed Care)

## 2024-03-18 DIAGNOSIS — Z789 Other specified health status: Principal | ICD-10-CM

## 2024-03-18 DIAGNOSIS — F250 Schizoaffective disorder, bipolar type: Principal | ICD-10-CM

## 2024-03-18 DIAGNOSIS — I10 Essential (primary) hypertension: Principal | ICD-10-CM

## 2024-03-18 DIAGNOSIS — F17200 Nicotine dependence, unspecified, uncomplicated: Principal | ICD-10-CM

## 2024-03-19 DIAGNOSIS — F17200 Nicotine dependence, unspecified, uncomplicated: Principal | ICD-10-CM

## 2024-03-19 DIAGNOSIS — F250 Schizoaffective disorder, bipolar type: Principal | ICD-10-CM

## 2024-03-19 DIAGNOSIS — I10 Essential (primary) hypertension: Principal | ICD-10-CM

## 2024-03-19 DIAGNOSIS — E1142 Type 2 diabetes mellitus with diabetic polyneuropathy: Principal | ICD-10-CM

## 2024-03-19 DIAGNOSIS — Z789 Other specified health status: Principal | ICD-10-CM

## 2024-03-19 DIAGNOSIS — I2510 Atherosclerotic heart disease of native coronary artery without angina pectoris: Principal | ICD-10-CM

## 2024-03-19 DIAGNOSIS — M109 Gout, unspecified: Principal | ICD-10-CM

## 2024-03-19 MED ORDER — METOPROLOL SUCCINATE ER 50 MG TABLET,EXTENDED RELEASE 24 HR
ORAL_TABLET | Freq: Every day | ORAL | 3 refills | 90.00000 days | Status: CP
Start: 2024-03-19 — End: ?

## 2024-03-19 MED ORDER — COLCHICINE 0.6 MG TABLET
ORAL_TABLET | Freq: Two times a day (BID) | ORAL | 0 refills | 4.00000 days | Status: CP
Start: 2024-03-19 — End: ?

## 2024-03-19 MED ORDER — METFORMIN 1,000 MG TABLET
ORAL_TABLET | Freq: Two times a day (BID) | ORAL | 3 refills | 90.00000 days | Status: CP
Start: 2024-03-19 — End: ?

## 2024-03-19 MED ORDER — EMPAGLIFLOZIN 10 MG TABLET
ORAL_TABLET | Freq: Every morning | ORAL | 3 refills | 90.00000 days | Status: CP
Start: 2024-03-19 — End: 2024-06-17

## 2024-03-19 MED ORDER — LISINOPRIL 40 MG TABLET
ORAL_TABLET | Freq: Every day | ORAL | 3 refills | 90.00000 days | Status: CP
Start: 2024-03-19 — End: ?

## 2024-03-27 ENCOUNTER — Encounter
Admit: 2024-03-27 | Discharge: 2024-03-27 | Payer: Medicare (Managed Care) | Attending: Student in an Organized Health Care Education/Training Program | Primary: Student in an Organized Health Care Education/Training Program

## 2024-03-27 ENCOUNTER — Emergency Department

## 2024-03-27 ENCOUNTER — Inpatient Hospital Stay
Admission: EM | Admit: 2024-03-27 | Discharge: 2024-03-29 | DRG: 291 | Disposition: A | Discharge status: Home / Self Care | Attending: Internal Medicine | Admitting: Internal Medicine

## 2024-03-27 ENCOUNTER — Other Ambulatory Visit: Payer: Self-pay

## 2024-03-27 DIAGNOSIS — F17200 Nicotine dependence, unspecified, uncomplicated: Secondary | ICD-10-CM | POA: Diagnosis not present

## 2024-03-27 DIAGNOSIS — Z7982 Long term (current) use of aspirin: Secondary | ICD-10-CM | POA: Diagnosis not present

## 2024-03-27 DIAGNOSIS — E785 Hyperlipidemia, unspecified: Secondary | ICD-10-CM | POA: Diagnosis present

## 2024-03-27 DIAGNOSIS — Z91148 Patient's other noncompliance with medication regimen for other reason: Secondary | ICD-10-CM

## 2024-03-27 DIAGNOSIS — Z794 Long term (current) use of insulin: Secondary | ICD-10-CM | POA: Diagnosis not present

## 2024-03-27 DIAGNOSIS — R590 Localized enlarged lymph nodes: Secondary | ICD-10-CM | POA: Diagnosis present

## 2024-03-27 DIAGNOSIS — R7989 Other specified abnormal findings of blood chemistry: Secondary | ICD-10-CM | POA: Diagnosis present

## 2024-03-27 DIAGNOSIS — Z5986 Financial insecurity: Secondary | ICD-10-CM

## 2024-03-27 DIAGNOSIS — Z8249 Family history of ischemic heart disease and other diseases of the circulatory system: Secondary | ICD-10-CM

## 2024-03-27 DIAGNOSIS — I5033 Acute on chronic diastolic (congestive) heart failure: Secondary | ICD-10-CM | POA: Diagnosis present

## 2024-03-27 DIAGNOSIS — Z716 Tobacco abuse counseling: Secondary | ICD-10-CM

## 2024-03-27 DIAGNOSIS — Z7984 Long term (current) use of oral hypoglycemic drugs: Secondary | ICD-10-CM

## 2024-03-27 DIAGNOSIS — J45909 Unspecified asthma, uncomplicated: Secondary | ICD-10-CM | POA: Diagnosis present

## 2024-03-27 DIAGNOSIS — Z79899 Other long term (current) drug therapy: Secondary | ICD-10-CM | POA: Diagnosis not present

## 2024-03-27 DIAGNOSIS — I10 Essential (primary) hypertension: Secondary | ICD-10-CM | POA: Diagnosis not present

## 2024-03-27 DIAGNOSIS — I2510 Atherosclerotic heart disease of native coronary artery without angina pectoris: Secondary | ICD-10-CM | POA: Diagnosis present

## 2024-03-27 DIAGNOSIS — Z5948 Other specified lack of adequate food: Secondary | ICD-10-CM

## 2024-03-27 DIAGNOSIS — J9811 Atelectasis: Secondary | ICD-10-CM | POA: Diagnosis present

## 2024-03-27 DIAGNOSIS — F250 Schizoaffective disorder, bipolar type: Secondary | ICD-10-CM | POA: Diagnosis present

## 2024-03-27 DIAGNOSIS — Z833 Family history of diabetes mellitus: Secondary | ICD-10-CM

## 2024-03-27 DIAGNOSIS — I160 Hypertensive urgency: Secondary | ICD-10-CM | POA: Diagnosis present

## 2024-03-27 DIAGNOSIS — I509 Heart failure, unspecified: Secondary | ICD-10-CM | POA: Diagnosis not present

## 2024-03-27 DIAGNOSIS — Z5941 Food insecurity: Secondary | ICD-10-CM

## 2024-03-27 DIAGNOSIS — F17210 Nicotine dependence, cigarettes, uncomplicated: Secondary | ICD-10-CM | POA: Diagnosis present

## 2024-03-27 DIAGNOSIS — R0989 Other specified symptoms and signs involving the circulatory and respiratory systems: Secondary | ICD-10-CM | POA: Diagnosis present

## 2024-03-27 DIAGNOSIS — I110 Hypertensive heart disease with heart failure: Principal | ICD-10-CM | POA: Diagnosis present

## 2024-03-27 DIAGNOSIS — E119 Type 2 diabetes mellitus without complications: Secondary | ICD-10-CM | POA: Diagnosis not present

## 2024-03-27 DIAGNOSIS — E1140 Type 2 diabetes mellitus with diabetic neuropathy, unspecified: Secondary | ICD-10-CM | POA: Diagnosis present

## 2024-03-27 DIAGNOSIS — F419 Anxiety disorder, unspecified: Secondary | ICD-10-CM | POA: Diagnosis present

## 2024-03-27 DIAGNOSIS — I2489 Other forms of acute ischemic heart disease: Secondary | ICD-10-CM | POA: Diagnosis present

## 2024-03-27 DIAGNOSIS — I5031 Acute diastolic (congestive) heart failure: Secondary | ICD-10-CM | POA: Diagnosis not present

## 2024-03-27 DIAGNOSIS — E1142 Type 2 diabetes mellitus with diabetic polyneuropathy: Principal | ICD-10-CM

## 2024-03-27 DIAGNOSIS — K219 Gastro-esophageal reflux disease without esophagitis: Principal | ICD-10-CM

## 2024-03-27 DIAGNOSIS — Z0000 Encounter for general adult medical examination without abnormal findings: Principal | ICD-10-CM

## 2024-03-27 DIAGNOSIS — E0839 Diabetes mellitus due to underlying condition with other diabetic ophthalmic complication: Principal | ICD-10-CM

## 2024-03-27 DIAGNOSIS — R079 Chest pain, unspecified: Principal | ICD-10-CM

## 2024-03-27 DIAGNOSIS — Z23 Encounter for immunization: Principal | ICD-10-CM

## 2024-03-27 DIAGNOSIS — R2242 Localized swelling, mass and lump, left lower limb: Principal | ICD-10-CM

## 2024-03-27 DIAGNOSIS — Z125 Encounter for screening for malignant neoplasm of prostate: Principal | ICD-10-CM

## 2024-03-27 DIAGNOSIS — R601 Generalized edema: Principal | ICD-10-CM

## 2024-03-27 DIAGNOSIS — E8770 Fluid overload, unspecified: Principal | ICD-10-CM

## 2024-03-27 LAB — BASIC METABOLIC PANEL WITH GFR
Anion gap: 11 (ref 5–15)
BUN: 16 mg/dL (ref 6–20)
CO2: 28 mmol/L (ref 22–32)
Calcium: 9 mg/dL (ref 8.9–10.3)
Chloride: 100 mmol/L (ref 98–111)
Creatinine, Ser: 1.5 mg/dL — ABNORMAL HIGH (ref 0.61–1.24)
GFR, Estimated: 56 mL/min — ABNORMAL LOW (ref >60)
Glucose, Bld: 144 mg/dL — ABNORMAL HIGH (ref 70–99)
Potassium: 4.1 mmol/L (ref 3.5–5.1)
Sodium: 139 mmol/L (ref 135–145)

## 2024-03-27 LAB — CBG MONITORING, ED: Glucose-Capillary: 85 mg/dL (ref 70–99)

## 2024-03-27 LAB — CBC
HCT: 44 % (ref 39.0–52.0)
Hemoglobin: 14.5 g/dL (ref 13.0–17.0)
MCH: 28.8 pg (ref 26.0–34.0)
MCHC: 33 g/dL (ref 30.0–36.0)
MCV: 87.5 fL (ref 80.0–100.0)
Platelets: 277 K/uL (ref 150–400)
RBC: 5.03 MIL/uL (ref 4.22–5.81)
RDW: 14.5 % (ref 11.5–15.5)
WBC: 10.7 K/uL — ABNORMAL HIGH (ref 4.0–10.5)
nRBC: 0 % (ref 0.0–0.2)

## 2024-03-27 LAB — TROPONIN I (HIGH SENSITIVITY)
Troponin I (High Sensitivity): 62 ng/L — ABNORMAL HIGH (ref <18)
Troponin I (High Sensitivity): 62 ng/L — ABNORMAL HIGH (ref <18)

## 2024-03-27 LAB — D-DIMER, QUANTITATIVE: D-Dimer, Quant: 0.73 ug{FEU}/mL — ABNORMAL HIGH (ref 0.00–0.50)

## 2024-03-27 LAB — BRAIN NATRIURETIC PEPTIDE: B Natriuretic Peptide: 571 pg/mL — ABNORMAL HIGH (ref 0.0–100.0)

## 2024-03-27 MED ORDER — IOHEXOL 350 MG/ML SOLN
75.0000 mL | Freq: Once | INTRAVENOUS | Status: AC | PRN
  Administered 2024-03-27: 75 mL via INTRAVENOUS

## 2024-03-27 MED ORDER — INSULIN ASPART 100 UNIT/ML IJ SOLN
0.0000 [IU] | Freq: Three times a day (TID) | INTRAMUSCULAR | Status: DC
  Administered 2024-03-28: 3 [IU] via SUBCUTANEOUS
  Administered 2024-03-28: 5 [IU] via SUBCUTANEOUS
  Administered 2024-03-29: 3 [IU] via SUBCUTANEOUS
  Filled 2024-03-27: qty 1
  Filled 2024-03-27: qty 5
  Filled 2024-03-27: qty 1

## 2024-03-27 MED ORDER — ONDANSETRON HCL 4 MG/2ML IJ SOLN: 4.0000 mg | Freq: Four times a day (QID) | INTRAMUSCULAR | Status: DC | PRN

## 2024-03-27 MED ORDER — SODIUM CHLORIDE 0.9% FLUSH
3.0000 mL | Freq: Two times a day (BID) | INTRAVENOUS | Status: DC
  Administered 2024-03-28 – 2024-03-29 (×4): 3 mL via INTRAVENOUS

## 2024-03-27 MED ORDER — METOPROLOL SUCCINATE ER 50 MG PO TB24
50.0000 mg | ORAL_TABLET | Freq: Every day | ORAL | Status: DC
  Administered 2024-03-28 – 2024-03-29 (×2): 50 mg via ORAL
  Filled 2024-03-27 (×2): qty 1

## 2024-03-27 MED ORDER — FUROSEMIDE 10 MG/ML IJ SOLN
40.0000 mg | Freq: Once | INTRAMUSCULAR | Status: AC
  Administered 2024-03-27: 40 mg via INTRAVENOUS
  Filled 2024-03-27: qty 4

## 2024-03-27 MED ORDER — LABETALOL HCL 5 MG/ML IV SOLN
10.0000 mg | Freq: Once | INTRAVENOUS | Status: AC
  Administered 2024-03-27: 10 mg via INTRAVENOUS
  Filled 2024-03-27: qty 4

## 2024-03-27 MED ORDER — LISINOPRIL 10 MG PO TABS
20.0000 mg | ORAL_TABLET | Freq: Every day | ORAL | Status: DC
Start: 2024-03-28 — End: 2024-03-28
  Administered 2024-03-28: 20 mg via ORAL
  Filled 2024-03-27: qty 2

## 2024-03-27 MED ORDER — ASPIRIN 325 MG PO TABS
325.0000 mg | ORAL_TABLET | Freq: Once | ORAL | Status: AC
  Administered 2024-03-27: 325 mg via ORAL
  Filled 2024-03-27: qty 1

## 2024-03-27 MED ORDER — SODIUM CHLORIDE 0.9 % IV SOLN: 250.0000 mL | INTRAVENOUS | Status: AC | PRN

## 2024-03-27 MED ORDER — INSULIN ASPART 100 UNIT/ML IJ SOLN: 0.0000 [IU] | Freq: Every day | INTRAMUSCULAR | Status: DC

## 2024-03-27 MED ORDER — ACETAMINOPHEN 325 MG PO TABS: 650.0000 mg | ORAL_TABLET | ORAL | Status: DC | PRN

## 2024-03-27 MED ORDER — FUROSEMIDE 10 MG/ML IJ SOLN
40.0000 mg | Freq: Two times a day (BID) | INTRAMUSCULAR | Status: DC
  Administered 2024-03-28 – 2024-03-29 (×3): 40 mg via INTRAVENOUS
  Filled 2024-03-27 (×3): qty 4

## 2024-03-27 MED ORDER — ASPIRIN 81 MG PO TBEC
81.0000 mg | DELAYED_RELEASE_TABLET | Freq: Every day | ORAL | Status: DC
  Administered 2024-03-28 – 2024-03-29 (×2): 81 mg via ORAL
  Filled 2024-03-27 (×2): qty 1

## 2024-03-27 MED ORDER — ENOXAPARIN SODIUM 60 MG/0.6ML IJ SOSY
55.0000 mg | PREFILLED_SYRINGE | INTRAMUSCULAR | Status: DC
  Administered 2024-03-28 – 2024-03-29 (×2): 55 mg via SUBCUTANEOUS
  Filled 2024-03-27 (×2): qty 0.6

## 2024-03-27 MED ORDER — SODIUM CHLORIDE 0.9% FLUSH: 3.0000 mL | INTRAVENOUS | Status: DC | PRN

## 2024-03-27 MED ORDER — AMLODIPINE 10 MG TABLET
ORAL_TABLET | Freq: Every day | ORAL | 2 refills | 90.00000 days | Status: CP
Start: 2024-03-27 — End: 2025-01-22

## 2024-03-27 MED ORDER — OMEPRAZOLE 40 MG CAPSULE,DELAYED RELEASE
ORAL_CAPSULE | Freq: Every day | ORAL | 3 refills | 90.00000 days | Status: CP
Start: 2024-03-27 — End: ?

## 2024-03-27 MED ORDER — EMPAGLIFLOZIN 25 MG TABLET
ORAL_TABLET | Freq: Every morning | ORAL | 3 refills | 90.00000 days | Status: CP
Start: 2024-03-27 — End: 2024-06-25

## 2024-03-27 MED ORDER — FUROSEMIDE 40 MG TABLET
ORAL_TABLET | Freq: Every day | ORAL | 11 refills | 30.00000 days | Status: CP
Start: 2024-03-27 — End: 2025-03-27

## 2024-03-27 NOTE — Progress Notes (Signed)
 PHARMACIST - PHYSICIAN COMMUNICATION  CONCERNING:  Enoxaparin  (Lovenox ) for DVT Prophylaxis    RECOMMENDATION: Patient was prescribed enoxaprin 40mg  q24 hours for VTE prophylaxis.   Filed Weights   03/27/24 2311  Weight: 108.9 kg (240 lb 1.3 oz)    Body mass index is 33.48 kg/m.  Estimated Creatinine Clearance: 73.9 mL/min (A) (by C-G formula based on SCr of 1.5 mg/dL (H)).   Based on [facility] policy patient is candidate for enoxaparin  0.5mg /kg TBW SQ every 24 hours based on BMI being >30.  DESCRIPTION: Pharmacy has adjusted enoxaparin  dose per [facility] policy.  Patient is now receiving enoxaparin  0.5 mg/kg every 24 hours   Rankin CANDIE Dills, PharmD, [facility] 03/27/2024 11:12 PM

## 2024-03-27 NOTE — ED Notes (Signed)
 CCMD contacted and pt placed on monitor.

## 2024-03-27 NOTE — ED Provider Notes (Signed)
 [facility] Provider Note    Event Date/Time   First MD Initiated Contact with Patient 03/27/24 1848     (approximate)   History   Weakness and Shortness of Breath   HPI  Henry Shaffer is a 48 year old male with history of hypertension, CAD, T2DM presenting to the emergency department for evaluation of chest pain and shortness of breath.  Reports worsening shortness of breath over the last week, worse when laying down.  Went to his primary care doctor today.  Had lab work performed demonstrating a high sensitive troponin 165, BNP of 4469.  Currently chest pain-free.  Reviewed hospital admission from 10/07/2023 this year.  At that time, patient presented with chest pain with minimally elevated troponins thought to be possibly related to hypertensive emergency.  Patient left prior to cardiology consult.  He did have a echo performed at that time demonstrating an EF of 60 to 65% with grade 1 diastolic dysfunction.     Physical Exam   Triage Vital Signs: ED Triage Vitals  Encounter Vitals Group     BP 03/27/24 1835 (!) 218/119     Girls Systolic BP Percentile --      Girls Diastolic BP Percentile --      Boys Systolic BP Percentile --      Boys Diastolic BP Percentile --      Pulse Rate 03/27/24 1835 93     Resp 03/27/24 1835 (!) 24     Temp 03/27/24 1835 97.8 F (36.6 C)     Temp src --      SpO2 03/27/24 1835 98 %     Weight --      Height --      Head Circumference --      Peak Flow --      Pain Score 03/27/24 1840 0     Pain Loc --      Pain Education --      Exclude from Growth Chart --     Most recent vital signs: Vitals:   03/27/24 2015 03/27/24 2049  BP: (!) 181/136 (!) 191/108  Pulse: 98   Resp: (!) 26   Temp:    SpO2: 97%      General: Awake, interactive  CV:  Regular rate, good peripheral perfusion.  Resp:  Unlabored respirations, bibasilar crackles noted Abd:  Nondistended Neuro:  Symmetric facial movement, fluid  speech MSK:  Bilateral lower extremity pitting edema, significantly worse on the left, patient does report history of trauma in this area with some increased baseline swelling in that leg   ED Results / Procedures / Treatments   Labs (all labs ordered are listed, but only abnormal results are displayed) Labs Reviewed  BASIC METABOLIC PANEL WITH GFR - Abnormal; Notable for the following components:      Result Value   Glucose, Bld 144 (*)    Creatinine, Ser 1.50 (*)    GFR, Estimated 56 (*)    All other components within normal limits  CBC - Abnormal; Notable for the following components:   WBC 10.7 (*)    All other components within normal limits  BRAIN NATRIURETIC PEPTIDE - Abnormal; Notable for the following components:   B Natriuretic Peptide 571.0 (*)    All other components within normal limits  D-DIMER, QUANTITATIVE - Abnormal; Notable for the following components:   D-Dimer, Quant 0.73 (*)    All other components within normal limits  TROPONIN I (HIGH SENSITIVITY) - Abnormal; Notable  for the following components:   Troponin I (High Sensitivity) 62 (*)    All other components within normal limits  TROPONIN I (HIGH SENSITIVITY) - Abnormal; Notable for the following components:   Troponin I (High Sensitivity) 62 (*)    All other components within normal limits     EKG EKG independently reviewed and interpreted by myself demonstrates:  EKG demonstrate sinus rhythm at a rate of 97, PR 140, QRS 116, QTc 482, nonspecific ST changes  RADIOLOGY Imaging independently reviewed and interpreted by myself demonstrates:  CTA of the chest without evidence of PE, does demonstrate findings concerning for pulmonary edema Chest x-Carmella Kees without obvious focal consolidation, questionable edema on my review  Formal Radiology Read:  CT Angio Chest PE W and/or Wo Contrast Result Date: 03/27/2024 CLINICAL DATA:  Pulmonary embolism (PE) suspected, low to intermediate prob, positive D-dimer  Shortness of breath. EXAM: CT ANGIOGRAPHY CHEST WITH CONTRAST TECHNIQUE: Multidetector CT imaging of the chest was performed using the standard protocol during bolus administration of intravenous contrast. Multiplanar CT image reconstructions and MIPs were obtained to evaluate the vascular anatomy. RADIATION DOSE REDUCTION: This exam was performed according to the departmental dose-optimization program which includes automated exposure control, adjustment of the mA and/or kV according to patient size and/or use of iterative reconstruction technique. CONTRAST:  75mL OMNIPAQUE  IOHEXOL  350 MG/ML SOLN COMPARISON:  Radiograph earlier today FINDINGS: Cardiovascular: There are no filling defects within the pulmonary arteries to suggest pulmonary embolus. The heart is upper normal in size. No pericardial effusion. The thoracic aorta is normal in caliber. No evidence of acute aortic finding. Aortic atherosclerosis and coronary artery calcifications. Contrast refluxes into the hepatic veins and IVC. Mediastinum/Nodes: Mild mediastinal and bilateral hilar adenopathy. Largest lymph node is in the right hilum measuring 19 mm short axis. No thyroid  nodule. Decompressed esophagus. Lungs/Pleura: Moderate bilateral pleural effusions. Associated compressive atelectasis. Bronchial thickening may be bronchitic or congestive. There is mild smooth septal thickening and ground-glass at the lung bases. 3 mm perifissural nodule in the anterior right middle lobe, series 5, image 78, is typical of an intrapulmonary lymph node. Benign calcified granuloma in the right upper lobe series 5, image 52. 3 mm right midlung nodule series 5, image 67. Upper Abdomen: 2.5 cm exophytic hyperdense lesion from the posterior right kidney. Musculoskeletal: Thoracic spondylosis. There are no acute or suspicious osseous abnormalities. Review of the MIP images confirms the above findings. IMPRESSION: 1. No pulmonary embolus. 2. Moderate bilateral pleural  effusions with compressive atelectasis. Mild smooth septal thickening and ground-glass at the lung bases, suggesting pulmonary edema. 3. Mild mediastinal and bilateral hilar adenopathy, nonspecific. This may be reactive. Recommend chest CT follow-up after resolution of symptoms for further assessment. 4. Exophytic hyperdense lesion from the posterior right kidney, incompletely characterized on this exam. Recommend nonemergent renal protocol MRI for characterization. Aortic Atherosclerosis (ICD10-I70.0). Electronically Signed   By: Andrea Gasman M.D.   On: 03/27/2024 22:27   DG Chest 2 View Result Date: 03/27/2024 CLINICAL DATA:  Shortness of breath EXAM: CHEST - 2 VIEW COMPARISON:  Chest x-Agamjot Kilgallon 10/06/2023 FINDINGS: There is minimal bibasilar atelectasis. There is no lung consolidation, pleural effusion or pneumothorax. Cardiomediastinal silhouette is within normal limits. No acute fractures. IMPRESSION: Minimal bibasilar atelectasis. Electronically Signed   By: Greig Pique M.D.   On: 03/27/2024 19:24    PROCEDURES:  Critical Care performed: Yes, see critical care procedure note(s)  CRITICAL CARE Performed by: Nilsa Dade   Total critical care time: 32 minutes  Critical care time was exclusive of separately billable procedures and treating other patients.  Critical care was necessary to treat or prevent imminent or life-threatening deterioration.  Critical care was time spent personally by me on the following activities: development of treatment plan with patient and/or surrogate as well as nursing, discussions with consultants, evaluation of patient's response to treatment, examination of patient, obtaining history from patient or surrogate, ordering and performing treatments and interventions, ordering and review of laboratory studies, ordering and review of radiographic studies, pulse oximetry and re-evaluation of patient's condition.   Procedures   MEDICATIONS ORDERED IN ED: Medications   furosemide  (LASIX ) injection 40 mg (40 mg Intravenous Given 03/27/24 1957)  aspirin  tablet 325 mg (325 mg Oral Given 03/27/24 2021)  labetalol  (NORMODYNE ) injection 10 mg (10 mg Intravenous Given 03/27/24 2021)  iohexol  (OMNIPAQUE ) 350 MG/ML injection 75 mL (75 mLs Intravenous Contrast Given 03/27/24 2132)     IMPRESSION / MDM / ASSESSMENT AND PLAN / ED COURSE  I reviewed the triage vital signs and the nursing notes.  Differential diagnosis includes, but is not limited to, new onset CHF, ACS, hypertensive emergency, PE  Patient's presentation is most consistent with acute presentation with potential threat to life or bodily function.  48 year old male presenting to the emergency department for evaluation of shortness of breath.  Hypertensive on presentation, slightly improved at the time of my initial evaluation.  Labs with mild leukocytosis, BMP with mildly elevated creatinine at 1.50, slightly worsened from patient's recent priors.  BNP elevated at 571.  Troponin elevated at 62, stable on repeat.  BNP slightly elevated at 0.73.  CT of the chest obtained without evidence of PE, does demonstrate findings concerning for edema.  Clinical history is concerning for CHF exacerbation.  Ordered for Lasix  as well as a dose of labetalol .  Case discussed with hospitalist team.  They will evaluate for anticipated admission.      FINAL CLINICAL IMPRESSION(S) / ED DIAGNOSES   Final diagnoses:  Acute on chronic congestive heart failure, unspecified heart failure type (HCC)     Rx / DC Orders   ED Discharge Orders     None        Note:  This document was prepared using Dragon voice recognition software and may include unintentional dictation errors.   Levander Slate, MD 03/27/24 [phone]

## 2024-03-27 NOTE — Telephone Encounter (Signed)
 Mr. Henry Shaffer is a 48 yo M with history notable for CAD, HTN, DM2, schizoaffective, who was seen in clinic for multiple chronic issues and medication reconciliation.   Patient did note that he had epigastric pain last night while lying down. Although he did not currently endorse chest pain or shortness of breath during the visit, we checked cardiac enzymes out of an abundance of caution.   1730 critical value for elevated troponin and  proBNP of 165 and 4469 respectively.  Spoke to patients brother/primary care taker Henry Shaffer) that the [name] cardiac enzmyes came back elevated; hs troponin 165, ProBNP 4,469. I let him know that I am concerned that his brother is having a heart attack and that he needs to get him to the nearest ER. I asked if they have any aspirin  at home in the hopes that he could take 4 baby aspirins but they do not have aspirin  at home. His brother is going to take him to the [facility] ER.
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
## 2024-03-27 NOTE — ED Triage Notes (Signed)
 Patient states shortness of breath for one week; went to PCP today and was called to come to nearest ED for Troponin 165 and BNP 4469. Patient denies chest pain at this time but states he does have chest pain at night when lying flat.

## 2024-03-28 ENCOUNTER — Inpatient Hospital Stay
Admit: 2024-03-28 | Discharge: 2024-03-28 | Disposition: A | Discharge status: Home / Self Care | Attending: Internal Medicine | Admitting: Internal Medicine

## 2024-03-28 DIAGNOSIS — I5033 Acute on chronic diastolic (congestive) heart failure: Secondary | ICD-10-CM

## 2024-03-28 DIAGNOSIS — F250 Schizoaffective disorder, bipolar type: Secondary | ICD-10-CM

## 2024-03-28 DIAGNOSIS — Z794 Long term (current) use of insulin: Secondary | ICD-10-CM

## 2024-03-28 DIAGNOSIS — R7989 Other specified abnormal findings of blood chemistry: Secondary | ICD-10-CM

## 2024-03-28 DIAGNOSIS — I10 Essential (primary) hypertension: Secondary | ICD-10-CM

## 2024-03-28 DIAGNOSIS — F17200 Nicotine dependence, unspecified, uncomplicated: Secondary | ICD-10-CM

## 2024-03-28 DIAGNOSIS — E119 Type 2 diabetes mellitus without complications: Secondary | ICD-10-CM | POA: Diagnosis not present

## 2024-03-28 DIAGNOSIS — E785 Hyperlipidemia, unspecified: Secondary | ICD-10-CM

## 2024-03-28 LAB — CBG MONITORING, ED
Glucose-Capillary: 158 mg/dL — ABNORMAL HIGH (ref 70–99)
Glucose-Capillary: 243 mg/dL — ABNORMAL HIGH (ref 70–99)
Glucose-Capillary: 110 mg/dL — ABNORMAL HIGH (ref 70–99)
Glucose-Capillary: 111 mg/dL — ABNORMAL HIGH (ref 70–99)

## 2024-03-28 LAB — BASIC METABOLIC PANEL WITH GFR
Anion gap: 9 (ref 5–15)
BUN: 19 mg/dL (ref 6–20)
CO2: 28 mmol/L (ref 22–32)
Calcium: 8.2 mg/dL — ABNORMAL LOW (ref 8.9–10.3)
Chloride: 101 mmol/L (ref 98–111)
Creatinine, Ser: 1.4 mg/dL — ABNORMAL HIGH (ref 0.61–1.24)
GFR, Estimated: >60 mL/min (ref >60)
Glucose, Bld: 62 mg/dL — ABNORMAL LOW (ref 70–99)
Potassium: 3.9 mmol/L (ref 3.5–5.1)
Sodium: 138 mmol/L (ref 135–145)

## 2024-03-28 LAB — ECHOCARDIOGRAM COMPLETE
AR max vel: 2.78 cm2
AV Area VTI: 2.72 cm2
AV Area mean vel: 2.3 cm2
AV Mean grad: 3 mmHg
AV Peak grad: 5.2 mmHg
Ao pk vel: 1.15 m/s
Area-P 1/2: 4.1 cm2
S' Lateral: 3.8 cm
Weight: 3841.3 [oz_av]

## 2024-03-28 MED ORDER — INSULIN GLARGINE 100 UNIT/ML ~~LOC~~ SOLN
10.0000 [IU] | Freq: Every day | SUBCUTANEOUS | Status: DC
  Administered 2024-03-28: 10 [IU] via SUBCUTANEOUS
  Filled 2024-03-28 (×2): qty 0.1

## 2024-03-28 MED ORDER — ATORVASTATIN CALCIUM 20 MG PO TABS
80.0000 mg | ORAL_TABLET | Freq: Every day | ORAL | Status: DC
  Administered 2024-03-28: 80 mg via ORAL
  Filled 2024-03-28: qty 4

## 2024-03-28 MED ORDER — GABAPENTIN 300 MG PO CAPS
300.0000 mg | ORAL_CAPSULE | Freq: Two times a day (BID) | ORAL | Status: DC
Start: 2024-03-28 — End: 2024-03-29
  Administered 2024-03-28 – 2024-03-29 (×2): 300 mg via ORAL
  Filled 2024-03-28 (×2): qty 1

## 2024-03-28 MED ORDER — HYDRALAZINE HCL 50 MG PO TABS: 25.0000 mg | ORAL_TABLET | Freq: Three times a day (TID) | ORAL | Status: DC | PRN

## 2024-03-28 MED ORDER — DIVALPROEX SODIUM ER 250 MG PO TB24
500.0000 mg | ORAL_TABLET | Freq: Three times a day (TID) | ORAL | Status: DC
  Administered 2024-03-28 – 2024-03-29 (×4): 500 mg via ORAL
  Filled 2024-03-28 (×4): qty 2

## 2024-03-28 MED ORDER — AMLODIPINE BESYLATE 5 MG PO TABS
10.0000 mg | ORAL_TABLET | Freq: Every day | ORAL | Status: DC
  Administered 2024-03-28 – 2024-03-29 (×2): 10 mg via ORAL
  Filled 2024-03-28 (×2): qty 2

## 2024-03-28 MED ORDER — FLUTICASONE FUROATE-VILANTEROL 100-25 MCG/ACT IN AEPB
1.0000 | INHALATION_SPRAY | Freq: Every morning | RESPIRATORY_TRACT | Status: DC
  Administered 2024-03-29: 1 via RESPIRATORY_TRACT
  Filled 2024-03-28 (×2): qty 28

## 2024-03-28 MED ORDER — LABETALOL HCL 5 MG/ML IV SOLN
20.0000 mg | INTRAVENOUS | Status: DC | PRN
  Administered 2024-03-28: 20 mg via INTRAVENOUS
  Filled 2024-03-28: qty 4

## 2024-03-28 MED ORDER — INSULIN ASPART 100 UNIT/ML IJ SOLN
5.0000 [IU] | Freq: Three times a day (TID) | INTRAMUSCULAR | Status: DC
  Administered 2024-03-29: 5 [IU] via SUBCUTANEOUS
  Filled 2024-03-28: qty 1

## 2024-03-28 MED ORDER — DEXTROSE 50 % IV SOLN
1.0000 | Freq: Once | INTRAVENOUS | Status: AC
  Administered 2024-03-28: 50 mL via INTRAVENOUS
  Filled 2024-03-28: qty 50

## 2024-03-28 MED ORDER — ARIPIPRAZOLE 15 MG PO TABS
15.0000 mg | ORAL_TABLET | Freq: Every day | ORAL | Status: DC
  Administered 2024-03-28 – 2024-03-29 (×2): 15 mg via ORAL
  Filled 2024-03-28 (×2): qty 1

## 2024-03-28 MED ORDER — LISINOPRIL 10 MG PO TABS
40.0000 mg | ORAL_TABLET | Freq: Every day | ORAL | Status: DC
  Administered 2024-03-29: 40 mg via ORAL
  Filled 2024-03-28: qty 4

## 2024-03-28 MED ORDER — IPRATROPIUM-ALBUTEROL 0.5-2.5 (3) MG/3ML IN SOLN
3.0000 mL | Freq: Four times a day (QID) | RESPIRATORY_TRACT | Status: DC
  Administered 2024-03-28 – 2024-03-29 (×6): 3 mL via RESPIRATORY_TRACT
  Filled 2024-03-28 (×6): qty 3

## 2024-03-28 MED ORDER — IPRATROPIUM-ALBUTEROL 0.5-2.5 (3) MG/3ML IN SOLN: 3.0000 mL | RESPIRATORY_TRACT | Status: DC | PRN

## 2024-03-28 NOTE — Progress Notes (Signed)
 Progress Note   Patient: Henry Shaffer FMW:[id number] DOB: 09/11/1975 DOA: 03/27/2024     1 DOS: the patient was seen and examined on 03/28/2024   Brief hospital course: Partly taken from H&P.  Henry Shaffer is a 48 y.o. male with medical history significant of artery disease, essential hypertension, non-insulin -dependent type 2 diabetes, tobacco abuse, schizoaffective disorder, hyperlipidemia, history of asthma who presented with progressive shortness of breath, cough, lower extremity edema.  Patient also reported orthopnea and PND.  No prior history of CHF.  Patient has pro BNP done as outpatient and it was elevated at 4469.  Came to ED for further evaluation.  Patient was previously admitted in March of this year for hypertensive urgency and echo during that admission with normal EF and grade 1 diastolic dysfunction.  On presentation patient was found to have significantly elevated blood pressure of 218/119, little tachypneic, blood glucose 144, creatinine 1.50, BNP 571, troponin 62, WBC 10.7.  D-dimer at 0.73 EKG was negative for any  acute ST changes. CTA was negative for PE but did show bilateral pleural effusions with compressive atelectasis, mild septal thickening and ground glass opacities at the lung bases consistent with pulmonary edema, mild mediastinal and bilateral hilar adenopathy which is nonspecific.  Patient was started on IV diuresis, repeat echocardiogram ordered.  9/4: Vital normal, some improvement of creatinine to 1.40, troponin 62 x 2.  Echocardiogram done with pending results-continue IV diuresis  Assessment and Plan: * Acute on chronic heart failure with preserved ejection fraction (HFpEF) (HCC) Echocardiogram done in March 2025 with normal EF and grade 1 diastolic dysfunction.  BNP elevated at 571 Repeat echocardiogram done-pending results Improving creatinine. - Continue with IV diuresis -Daily weight and BMP -Strict intake and output  HTN  (hypertension) Hypertensive urgency.  Patient with significantly elevated blood pressure on admission, likely secondary to being noncompliant with his medication.  Most likely resulted in some pulmonary edema. -Continue home lisinopril , metoprolol  and amlodipine  -As needed hydralazine   Elevated troponin Mildly elevated troponin with a flat curve, likely secondary to demand ischemia with elevated blood pressure. No chest pain.  Echocardiogram done-pending results  Diabetes (HCC) CBC elevated. -Adding Lantus  10 units at night -Add 5 units with meal -Continue with SSI  Dyslipidemia - Continue home Lipitor  Schizoaffective disorder, bipolar type (HCC) - Continue home psych meds  Tobacco use disorder Counseling was provided. -Nicotine  patch as needed   Subjective: Patient was seen and examined today.  Still having mild shortness of breath, no chest pain.  Physical Exam: Vitals:   03/28/24 0955 03/28/24 1000 03/28/24 1217 03/28/24 1527  BP: (!) 159/95 (!) 148/113 (!) 155/103 (!) 189/92  Pulse: 62 64 (!) 57 60  Resp:  (!) 21 20 19   Temp:   97.8 F (36.6 C) 97.9 F (36.6 C)  TempSrc:   Oral Oral  SpO2:  96% 96% 98%  Weight:       General.  Obese gentleman, in no acute distress. Pulmonary.  Few basal crackles bilaterally, normal respiratory effort. CV.  Regular rate and rhythm, no JVD, rub or murmur. Abdomen.  Soft, nontender, nondistended, BS positive. CNS.  Alert and oriented .  No focal neurologic deficit. Extremities.  Chronic LLE lymphedema, no edema on right pulses intact and symmetrical. Psychiatry.  Judgment and insight appears normal.   Data Reviewed: Prior data reviewed  Family Communication: Discussed with patient  Disposition: Status is: Inpatient Remains inpatient appropriate because: Severity of illness  Planned Discharge Destination: Home  DVT  prophylaxis.  Lovenox  Time spent: 50 minutes  This record has been created using Manufacturing engineer. Errors have been sought and corrected,but may not always be located. Such creation errors do not reflect on the standard of care.   Author: Amaryllis Dare, MD 03/28/2024 3:33 PM  For on call review [email or web address].

## 2024-03-28 NOTE — Hospital Course (Addendum)
 Partly taken from H&P.  Henry Shaffer is a 48 y.o. male with medical history significant of artery disease, essential hypertension, non-insulin -dependent type 2 diabetes, tobacco abuse, schizoaffective disorder, hyperlipidemia, history of asthma who presented with progressive shortness of breath, cough, lower extremity edema.  Patient also reported orthopnea and PND.  No prior history of CHF.  Patient has pro BNP done as outpatient and it was elevated at 4469.  Came to ED for further evaluation.  Patient was previously admitted in March of this year for hypertensive urgency and echo during that admission with normal EF and grade 1 diastolic dysfunction.  On presentation patient was found to have significantly elevated blood pressure of 218/119, little tachypneic, blood glucose 144, creatinine 1.50, BNP 571, troponin 62, WBC 10.7.  D-dimer at 0.73 EKG was negative for any  acute ST changes. CTA was negative for PE but did show bilateral pleural effusions with compressive atelectasis, mild septal thickening and ground glass opacities at the lung bases consistent with pulmonary edema, mild mediastinal and bilateral hilar adenopathy which is nonspecific.  Patient was started on IV diuresis, repeat echocardiogram ordered.  9/4: Vital normal, some improvement of creatinine to 1.40, troponin 62 x 2.  Echocardiogram done with pending results-continue IV diuresis  9/5: Hemodynamically stable, shortness of breath improved.  Creatinine at 1.42.  Echocardiogram with normal EF and grade 3/restrictive diastolic dysfunction, it was grade 1 diastolic in March 2025, cardiology was also consulted, cardiac MRI obtained by cardiology to rule out any infiltrative disease.  They will do a close outpatient follow-up for further assistance.  Patient wants to go home and is being discharged on current medications.  He was provided with all new prescriptions as he requested.  He should be following up with his psychiatrist  for further assistance regarding his psych medications, we provided him with 1 month supply.  Patient need to have a close follow-up with his providers including cardiology and psychiatry along with primary care provider for further assistance.

## 2024-03-28 NOTE — Assessment & Plan Note (Signed)
 Continue home Lipitor

## 2024-03-28 NOTE — Assessment & Plan Note (Signed)
Counseling was provided. -Nicotine patch as needed 

## 2024-03-28 NOTE — ED Notes (Signed)
 Pt under the impression that he would be discharged today. After speaking with the MD, pt informed that he will be staying in the hospital to treat his CHF. Pt expressed understanding.

## 2024-03-28 NOTE — ED Notes (Signed)
Pt given a snack 

## 2024-03-28 NOTE — Progress Notes (Signed)
*  PRELIMINARY RESULTS* Echocardiogram 2D Echocardiogram has been performed.  Henry Shaffer 03/28/2024, 2:45 PM

## 2024-03-28 NOTE — Assessment & Plan Note (Signed)
 CBC elevated. -Adding Lantus  10 units at night -Add 5 units with meal -Continue with SSI

## 2024-03-28 NOTE — Assessment & Plan Note (Signed)
 Continue home psych meds  ?

## 2024-03-28 NOTE — Assessment & Plan Note (Signed)
 Mildly elevated troponin with a flat curve, likely secondary to demand ischemia with elevated blood pressure. No chest pain.  Echocardiogram done-pending results

## 2024-03-28 NOTE — Assessment & Plan Note (Signed)
 Hypertensive urgency.  Patient with significantly elevated blood pressure on admission, likely secondary to being noncompliant with his medication.  Most likely resulted in some pulmonary edema. -Continue home lisinopril , metoprolol  and amlodipine  -As needed hydralazine 

## 2024-03-28 NOTE — Assessment & Plan Note (Signed)
 Echocardiogram done in March 2025 with normal EF and grade 1 diastolic dysfunction.  BNP elevated at 571 Repeat echocardiogram done-pending results Improving creatinine. - Continue with IV diuresis -Daily weight and BMP -Strict intake and output

## 2024-03-29 ENCOUNTER — Other Ambulatory Visit (HOSPITAL_COMMUNITY): Payer: Self-pay

## 2024-03-29 ENCOUNTER — Encounter: Payer: Self-pay | Admitting: Internal Medicine

## 2024-03-29 ENCOUNTER — Inpatient Hospital Stay (HOSPITAL_COMMUNITY)

## 2024-03-29 ENCOUNTER — Other Ambulatory Visit: Payer: Self-pay

## 2024-03-29 DIAGNOSIS — I10 Essential (primary) hypertension: Secondary | ICD-10-CM | POA: Diagnosis not present

## 2024-03-29 DIAGNOSIS — I5033 Acute on chronic diastolic (congestive) heart failure: Secondary | ICD-10-CM

## 2024-03-29 DIAGNOSIS — I509 Heart failure, unspecified: Secondary | ICD-10-CM

## 2024-03-29 DIAGNOSIS — R7989 Other specified abnormal findings of blood chemistry: Secondary | ICD-10-CM | POA: Diagnosis not present

## 2024-03-29 LAB — BASIC METABOLIC PANEL WITH GFR
Anion gap: 13 (ref 5–15)
BUN: 20 mg/dL (ref 6–20)
CO2: 30 mmol/L (ref 22–32)
Calcium: 8.6 mg/dL — ABNORMAL LOW (ref 8.9–10.3)
Chloride: 97 mmol/L — ABNORMAL LOW (ref 98–111)
Creatinine, Ser: 1.42 mg/dL — ABNORMAL HIGH (ref 0.61–1.24)
GFR, Estimated: >60 mL/min (ref >60)
Glucose, Bld: 118 mg/dL — ABNORMAL HIGH (ref 70–99)
Potassium: 3.6 mmol/L (ref 3.5–5.1)
Sodium: 140 mmol/L (ref 135–145)

## 2024-03-29 LAB — CBG MONITORING, ED
Glucose-Capillary: 184 mg/dL — ABNORMAL HIGH (ref 70–99)
Glucose-Capillary: 86 mg/dL (ref 70–99)

## 2024-03-29 MED ORDER — LOSARTAN POTASSIUM 50 MG PO TABS
50.0000 mg | ORAL_TABLET | Freq: Every day | ORAL | 1 refills | Status: AC
  Filled 2024-03-29: qty 90, 90d supply, fill #0

## 2024-03-29 MED ORDER — METOPROLOL SUCCINATE ER 50 MG PO TB24
50.0000 mg | ORAL_TABLET | Freq: Every day | ORAL | 1 refills | Status: AC
  Filled 2024-03-29: qty 90, 90d supply, fill #0

## 2024-03-29 MED ORDER — GLIPIZIDE 5 MG PO TABS
5.0000 mg | ORAL_TABLET | Freq: Two times a day (BID) | ORAL | 2 refills | Status: AC
  Filled 2024-03-29: qty 60, 30d supply, fill #0

## 2024-03-29 MED ORDER — FUROSEMIDE 40 MG PO TABS: 40.0000 mg | ORAL_TABLET | Freq: Every day | ORAL | Status: DC

## 2024-03-29 MED ORDER — ALBUTEROL SULFATE HFA 108 (90 BASE) MCG/ACT IN AERS
2.0000 | INHALATION_SPRAY | Freq: Four times a day (QID) | RESPIRATORY_TRACT | 2 refills | Status: AC | PRN
  Filled 2024-03-29: qty 6.7, 30d supply, fill #0

## 2024-03-29 MED ORDER — EMPAGLIFLOZIN 25 MG PO TABS
25.0000 mg | ORAL_TABLET | Freq: Every day | ORAL | 2 refills | Status: AC
  Filled 2024-03-29: qty 30, 30d supply, fill #0

## 2024-03-29 MED ORDER — OMEPRAZOLE 40 MG PO CPDR
40.0000 mg | DELAYED_RELEASE_CAPSULE | Freq: Every day | ORAL | 2 refills | Status: AC
  Filled 2024-03-29: qty 30, 30d supply, fill #0

## 2024-03-29 MED ORDER — ARIPIPRAZOLE 15 MG PO TABS
15.0000 mg | ORAL_TABLET | Freq: Every day | ORAL | 0 refills | Status: AC
  Filled 2024-03-29: qty 30, 30d supply, fill #0

## 2024-03-29 MED ORDER — ACETAMINOPHEN 325 MG PO TABS
650.0000 mg | ORAL_TABLET | ORAL | 0 refills | Status: AC | PRN
  Filled 2024-03-29: qty 90, 8d supply, fill #0

## 2024-03-29 MED ORDER — FLUTICASONE FUROATE-VILANTEROL 100-25 MCG/ACT IN AEPB
1.0000 | INHALATION_SPRAY | Freq: Every morning | RESPIRATORY_TRACT | 2 refills | Status: AC
  Filled 2024-03-29: qty 60, 30d supply, fill #0

## 2024-03-29 MED ORDER — METFORMIN HCL 1000 MG PO TABS
1000.0000 mg | ORAL_TABLET | Freq: Two times a day (BID) | ORAL | 2 refills | Status: AC
  Filled 2024-03-29: qty 60, 30d supply, fill #0

## 2024-03-29 MED ORDER — EMPAGLIFLOZIN 10 MG PO TABS
10.0000 mg | ORAL_TABLET | Freq: Every day | ORAL | Status: DC
  Administered 2024-03-29: 10 mg via ORAL
  Filled 2024-03-29: qty 1

## 2024-03-29 MED ORDER — GABAPENTIN 300 MG PO CAPS
300.0000 mg | ORAL_CAPSULE | Freq: Two times a day (BID) | ORAL | 1 refills | Status: AC
Start: 2024-03-29 — End: ?
  Filled 2024-03-29: qty 60, 30d supply, fill #0

## 2024-03-29 MED ORDER — FUROSEMIDE 40 MG PO TABS
40.0000 mg | ORAL_TABLET | Freq: Every day | ORAL | 1 refills | Status: AC
  Filled 2024-03-29: qty 90, 90d supply, fill #0

## 2024-03-29 MED ORDER — ATORVASTATIN CALCIUM 80 MG PO TABS
80.0000 mg | ORAL_TABLET | Freq: Every day | ORAL | 1 refills | Status: AC
  Filled 2024-03-29: qty 90, 90d supply, fill #0

## 2024-03-29 MED ORDER — DIVALPROEX SODIUM ER 500 MG PO TB24
500.0000 mg | ORAL_TABLET | Freq: Three times a day (TID) | ORAL | 0 refills | Status: AC
  Filled 2024-03-29: qty 90, 30d supply, fill #0

## 2024-03-29 MED ORDER — FUROSEMIDE 10 MG/ML IJ SOLN: 40.0000 mg | Freq: Two times a day (BID) | INTRAMUSCULAR | Status: DC

## 2024-03-29 MED ORDER — AMLODIPINE BESYLATE 10 MG PO TABS
10.0000 mg | ORAL_TABLET | Freq: Every day | ORAL | 1 refills | Status: AC
  Filled 2024-03-29: qty 90, 90d supply, fill #0

## 2024-03-29 MED ORDER — ASPIRIN 81 MG PO TBEC
81.0000 mg | DELAYED_RELEASE_TABLET | Freq: Every day | ORAL | 12 refills | Status: AC
  Filled 2024-03-29: qty 30, 30d supply, fill #0

## 2024-03-29 MED ORDER — GADOBUTROL 1 MMOL/ML IV SOLN
13.0000 mL | Freq: Once | INTRAVENOUS | Status: AC | PRN
  Administered 2024-03-29: 13 mL via INTRAVENOUS

## 2024-03-29 MED ORDER — LOSARTAN POTASSIUM 50 MG PO TABS: 50.0000 mg | ORAL_TABLET | Freq: Every day | ORAL | Status: DC

## 2024-03-29 NOTE — ED Notes (Signed)
 Pt at MRI

## 2024-03-29 NOTE — Progress Notes (Addendum)
 Heart Failure Navigator Progress Note  Heart failure education completed with patient this AM.  TOC with [facility] cancelled due to consult switched to [facility] Heart Failure.  Appointment with [facility] AHF clinic cancelled  and patient will follow-up with Dr. Dewane @ [facility]  Navigator will sign off at this time.  Charmaine Pines, RN, BSN [facility] Heart Failure Navigator Secure Chat Only

## 2024-03-29 NOTE — Progress Notes (Addendum)
 Patient requested list of food banks.  Will provide him with one from the HF Clinic.

## 2024-03-29 NOTE — Discharge Summary (Signed)
 Physician Discharge Summary   Patient: Henry Shaffer MRN: [id number] DOB: [birth year]  Admit date:     03/27/2024  Discharge date: 03/29/24  Discharge Physician: Amaryllis Dare   PCP: Trudy Merri LABOR, MD   Recommendations at discharge:  Please obtain CBC and BMP on follow-up Follow-up with cardiology Follow-up with primary care provider  Discharge Diagnoses: Principal Problem:   Acute on chronic heart failure with preserved ejection fraction (HFpEF) (HCC) Active Problems:   HTN (hypertension)   Elevated troponin   Diabetes (HCC)   Dyslipidemia   Schizoaffective disorder, bipolar type (HCC)   Tobacco use disorder   Hospital Course: Partly taken from H&P.  Henry Shaffer is a 48 y.o. male with medical history significant of artery disease, essential hypertension, non-insulin -dependent type 2 diabetes, tobacco abuse, schizoaffective disorder, hyperlipidemia, history of asthma who presented with progressive shortness of breath, cough, lower extremity edema.  Patient also reported orthopnea and PND.  No prior history of CHF.  Patient has pro BNP done as outpatient and it was elevated at 4469.  Came to ED for further evaluation.  Patient was previously admitted in March of this year for hypertensive urgency and echo during that admission with normal EF and grade 1 diastolic dysfunction.  On presentation patient was found to have significantly elevated blood pressure of 218/119, little tachypneic, blood glucose 144, creatinine 1.50, BNP 571, troponin 62, WBC 10.7.  D-dimer at 0.73 EKG was negative for any  acute ST changes. CTA was negative for PE but did show bilateral pleural effusions with compressive atelectasis, mild septal thickening and ground glass opacities at the lung bases consistent with pulmonary edema, mild mediastinal and bilateral hilar adenopathy which is nonspecific.  Patient was started on IV diuresis, repeat echocardiogram ordered.  9/4: Vital normal, some  improvement of creatinine to 1.40, troponin 62 x 2.  Echocardiogram done with pending results-continue IV diuresis  9/5: Hemodynamically stable, shortness of breath improved.  Creatinine at 1.42.  Echocardiogram with normal EF and grade 3/restrictive diastolic dysfunction, it was grade 1 diastolic in March 2025, cardiology was also consulted, cardiac MRI obtained by cardiology to rule out any infiltrative disease.  They will do a close outpatient follow-up for further assistance.  Patient wants to go home and is being discharged on current medications.  He was provided with all new prescriptions as he requested.  He should be following up with his psychiatrist for further assistance regarding his psych medications, we provided him with 1 month supply.  Patient need to have a close follow-up with his providers including cardiology and psychiatry along with primary care provider for further assistance.  Assessment and Plan: * Acute on chronic heart failure with preserved ejection fraction (HFpEF) (HCC) Echocardiogram done in March 2025 with normal EF and grade 1 diastolic dysfunction.  BNP elevated at 571 Repeat echocardiogram with worsening of diastolic dysfunction, now grade 3/restrictive. Symptoms improved and creatinine stable. Cardiology was consulted and cardiac MRI was done to rule out any infiltrative disease, cardiology will follow-up as outpatient. -He will resume his home Lasix  on discharge.  HTN (hypertension) Hypertensive urgency.  Patient with significantly elevated blood pressure on admission, likely secondary to being noncompliant with his medication.  Most likely resulted in some pulmonary edema. -Continue home metoprolol  and amlodipine  -Home lisinopril  was switched with losartan  -As needed hydralazine   Elevated troponin Mildly elevated troponin with a flat curve, likely secondary to demand ischemia with elevated blood pressure. No chest pain.  Echocardiogram with no regional  wall  motion abnormalities  Diabetes (HCC) CBG now within goal. - Will resume home metoprolol , glipizide  and Jardiance   Dyslipidemia - Continue home Lipitor  Schizoaffective disorder, bipolar type (HCC) - Continue home psych meds  Tobacco use disorder Counseling was provided. -Nicotine  patch as needed  Consultants: Cardiology Procedures performed: None Disposition: Home Diet recommendation:  Discharge Diet Orders (From admission, onward)     Start     Ordered   03/29/24 0000  Diet - low sodium heart healthy        03/29/24 1429           Cardiac and Carb modified diet DISCHARGE MEDICATION: Allergies as of 03/29/2024       Reactions   Codeine Other (See Comments)   Unknown. Pt's father reported this in the past, but pt doesn't know his reaction.   Erythromycin Other (See Comments)   Unknown. Pt's father reported this in the past, but pt doesn't know his reaction.   Sulfa Antibiotics Other (See Comments)   Unknown. Pt's father reported this in the past, but pt doesn't know his reaction.   Sulfasalazine Other (See Comments)   Unknown. Pt's father reported this in the past, but pt doesn't know his reaction.        Medication List     STOP taking these medications    lisinopril  40 MG tablet Commonly known as: ZESTRIL        TAKE these medications    acetaminophen  325 MG tablet Commonly known as: TYLENOL  Take 2 tablets (650 mg total) by mouth every 4 (four) hours as needed for headache or mild pain (pain score 1-3).   albuterol  108 (90 Base) MCG/ACT inhaler Commonly known as: VENTOLIN  HFA Inhale 2 puffs into the lungs every 6 (six) hours as needed for wheezing or shortness of breath.   amLODipine  10 MG tablet Commonly known as: NORVASC  Take 1 tablet (10 mg total) by mouth daily.   ARIPiprazole  15 MG tablet Commonly known as: ABILIFY  Take 1 tablet (15 mg total) by mouth daily.   aspirin  EC 81 MG tablet Take 1 tablet (81 mg total) by mouth daily.  Swallow whole. Start taking on: March 30, 2024   atorvastatin  80 MG tablet Commonly known as: LIPITOR Take 1 tablet (80 mg total) by mouth daily at 6 PM.   divalproex  500 MG 24 hr tablet Commonly known as: DEPAKOTE  ER Take 1 tablet (500 mg total) by mouth 3 (three) times daily. What changed:  how much to take when to take this   empagliflozin  25 MG Tabs tablet Commonly known as: JARDIANCE  Take 1 tablet (25 mg total) by mouth daily. Start taking on: March 30, 2024   fluticasone  furoate-vilanterol 100-25 MCG/ACT Aepb Commonly known as: BREO ELLIPTA  Inhale 1 puff into the lungs every morning.   furosemide  40 MG tablet Commonly known as: LASIX  Take 1 tablet (40 mg total) by mouth daily.   gabapentin  300 MG capsule Commonly known as: NEURONTIN  Take 1 capsule (300 mg total) by mouth 2 (two) times daily.   glipiZIDE  5 MG tablet Commonly known as: GLUCOTROL  Take 1 tablet (5 mg total) by mouth 2 (two) times daily.   losartan  50 MG tablet Commonly known as: COZAAR  Take 1 tablet (50 mg total) by mouth daily. Start taking on: March 30, 2024   metFORMIN  1000 MG tablet Commonly known as: GLUCOPHAGE  Take 1 tablet (1,000 mg total) by mouth 2 (two) times daily with a meal. (breakfast and dinner)   metoprolol  succinate 50 MG 24  hr tablet Commonly known as: TOPROL -XL Take 1 tablet (50 mg total) by mouth daily.   omeprazole  40 MG capsule Commonly known as: PRILOSEC Take 1 capsule (40 mg total) by mouth daily.   Paliperidone  Palmitate ER 546 MG/1.75ML injection Commonly known as: INVEGA  TRINZA Inject 546 mg into the muscle every 3 (three) months.   traZODone  50 MG tablet Commonly known as: DESYREL  Take 50 mg by mouth at bedtime.        Follow-up Information     [place], Caralyn, PA-C. Go on 04/03/2024.   Specialty: Cardiology Why: HF - 09/10 at 10 AM. Contact information: [address] [place] [address] [phone]         Trudy Merri LABOR, MD. Schedule an appointment as soon as possible for a visit in 1 week(s).   Specialty: Internal Medicine Contact information: [address] [place] [address] [phone]                Discharge Exam: Filed Weights   03/27/24 2311  Weight: 108.9 kg   General.  Obese gentleman, in no acute distress. Pulmonary.  Lungs clear bilaterally, normal respiratory effort. CV.  Regular rate and rhythm, no JVD, rub or murmur. Abdomen.  Soft, nontender, nondistended, BS positive. CNS.  Alert and oriented .  No focal neurologic deficit. Extremities.  No edema, pulses intact and symmetrical. Psychiatry.  Judgment and insight appears normal.   Condition at discharge: stable  The results of significant diagnostics from this hospitalization (including imaging, microbiology, ancillary and laboratory) are listed below for reference.   Imaging Studies: ECHOCARDIOGRAM COMPLETE Result Date: 03/28/2024    ECHOCARDIOGRAM REPORT   Patient Name:   Henry Shaffer Date of Exam: 03/28/2024 Medical Rec #:  [id number]        Height:       71.0 in Accession #:    [id number]       Weight:       240.1 lb Date of Birth:  Feb 09, 1976        BSA:          2.279 m Patient Age:    48 years         BP:           155/103 mmHg Patient Gender: M                HR:           57 bpm. Exam Location:  [facility] Procedure: 2D Echo, Cardiac Doppler and Color Doppler (Both Spectral and Color            Flow Doppler were utilized during procedure). Indications:     CHF-acute diastolic I50.31  History:         Patient has prior history of Echocardiogram examinations, most                  recent 10/07/2023. Risk Factors:Diabetes, Hypertension and                  Dyslipidemia.  Sonographer:     Christopher Furnace Referring Phys:  [id number] EMERY LITTIE FUSS Diagnosing Phys: Cara JONETTA Lovelace MD IMPRESSIONS  1. Left ventricular ejection fraction, by estimation, is 60 to 65%. The left ventricle has normal function. The left ventricle has no regional  wall motion abnormalities. There is mild concentric left ventricular hypertrophy. Left ventricular diastolic parameters are consistent with Grade III diastolic dysfunction (restrictive).  2. Right ventricular systolic function is normal. The  right ventricular size is normal.  3. The mitral valve is normal in structure. Trivial mitral valve regurgitation.  4. The aortic valve is normal in structure. Aortic valve regurgitation is not visualized. Aortic valve sclerosis is present, with no evidence of aortic valve stenosis. FINDINGS  Left Ventricle: Left ventricular ejection fraction, by estimation, is 60 to 65%. The left ventricle has normal function. The left ventricle has no regional wall motion abnormalities. Strain was performed and the global longitudinal strain is indeterminate. The left ventricular internal cavity size was normal in size. There is mild concentric left ventricular hypertrophy. Left ventricular diastolic parameters are consistent with Grade III diastolic dysfunction (restrictive). Right Ventricle: The right ventricular size is normal. No increase in right ventricular wall thickness. Right ventricular systolic function is normal. Left Atrium: Left atrial size was normal in size. Right Atrium: Right atrial size was normal in size. Pericardium: There is no evidence of pericardial effusion. Mitral Valve: The mitral valve is normal in structure. Trivial mitral valve regurgitation. Tricuspid Valve: The tricuspid valve is normal in structure. Tricuspid valve regurgitation is trivial. Aortic Valve: The aortic valve is normal in structure. Aortic valve regurgitation is not visualized. Aortic valve sclerosis is present, with no evidence of aortic valve stenosis. Aortic valve mean gradient measures 3.0 mmHg. Aortic valve peak gradient measures 5.2 mmHg. Aortic valve area, by VTI measures 2.72 cm. Pulmonic Valve: The pulmonic valve was normal in structure. Pulmonic valve regurgitation is not visualized.  Aorta: The ascending aorta was not well visualized. IAS/Shunts: No atrial level shunt detected by color flow Doppler. Additional Comments: 3D was performed not requiring image post processing on an independent workstation and was indeterminate.  LEFT VENTRICLE PLAX 2D LVIDd:         5.90 cm   Diastology LVIDs:         3.80 cm   LV e' medial:    7.18 cm/s LV PW:         1.20 cm   LV E/e' medial:  14.6 LV IVS:        1.20 cm   LV e' lateral:   11.60 cm/s LVOT diam:     2.30 cm   LV E/e' lateral: 9.1 LV SV:         58 LV SV Index:   26 LVOT Area:     4.15 cm  RIGHT VENTRICLE RV Basal diam:  4.40 cm RV Mid diam:    3.30 cm RV S prime:     9.46 cm/s TAPSE (M-mode): 2.4 cm LEFT ATRIUM           Index        RIGHT ATRIUM           Index LA diam:      4.90 cm 2.15 cm/m   RA Area:     14.60 cm LA Vol (A2C): 62.4 ml 27.38 ml/m  RA Volume:   36.20 ml  15.89 ml/m LA Vol (A4C): 67.0 ml 29.40 ml/m  AORTIC VALVE AV Area (Vmax):    2.78 cm AV Area (Vmean):   2.30 cm AV Area (VTI):     2.72 cm AV Vmax:           114.50 cm/s AV Vmean:          83.950 cm/s AV VTI:            0.214 m AV Peak Grad:      5.2 mmHg AV Mean Grad:  3.0 mmHg LVOT Vmax:         76.60 cm/s LVOT Vmean:        46.500 cm/s LVOT VTI:          0.140 m LVOT/AV VTI ratio: 0.66  AORTA Ao Root diam: 3.40 cm MITRAL VALVE                TRICUSPID VALVE MV Area (PHT): 4.10 cm     TR Peak grad:   17.1 mmHg MV Decel Time: 185 msec     TR Vmax:        207.00 cm/s MV E velocity: 105.00 cm/s MV A velocity: 37.20 cm/s   SHUNTS MV E/A ratio:  2.82         Systemic VTI:  0.14 m                             Systemic Diam: 2.30 cm Cara JONETTA Lovelace MD Electronically signed by Cara JONETTA Lovelace MD Signature Date/Time: 03/28/2024/10:45:53 PM    Final    CT Angio Chest PE W and/or Wo Contrast Result Date: 03/27/2024 CLINICAL DATA:  Pulmonary embolism (PE) suspected, low to intermediate prob, positive D-dimer Shortness of breath. EXAM: CT ANGIOGRAPHY CHEST WITH CONTRAST  TECHNIQUE: Multidetector CT imaging of the chest was performed using the standard protocol during bolus administration of intravenous contrast. Multiplanar CT image reconstructions and MIPs were obtained to evaluate the vascular anatomy. RADIATION DOSE REDUCTION: This exam was performed according to the departmental dose-optimization program which includes automated exposure control, adjustment of the mA and/or kV according to patient size and/or use of iterative reconstruction technique. CONTRAST:  75mL OMNIPAQUE  IOHEXOL  350 MG/ML SOLN COMPARISON:  Radiograph earlier today FINDINGS: Cardiovascular: There are no filling defects within the pulmonary arteries to suggest pulmonary embolus. The heart is upper normal in size. No pericardial effusion. The thoracic aorta is normal in caliber. No evidence of acute aortic finding. Aortic atherosclerosis and coronary artery calcifications. Contrast refluxes into the hepatic veins and IVC. Mediastinum/Nodes: Mild mediastinal and bilateral hilar adenopathy. Largest lymph node is in the right hilum measuring 19 mm short axis. No thyroid  nodule. Decompressed esophagus. Lungs/Pleura: Moderate bilateral pleural effusions. Associated compressive atelectasis. Bronchial thickening may be bronchitic or congestive. There is mild smooth septal thickening and ground-glass at the lung bases. 3 mm perifissural nodule in the anterior right middle lobe, series 5, image 78, is typical of an intrapulmonary lymph node. Benign calcified granuloma in the right upper lobe series 5, image 52. 3 mm right midlung nodule series 5, image 67. Upper Abdomen: 2.5 cm exophytic hyperdense lesion from the posterior right kidney. Musculoskeletal: Thoracic spondylosis. There are no acute or suspicious osseous abnormalities. Review of the MIP images confirms the above findings. IMPRESSION: 1. No pulmonary embolus. 2. Moderate bilateral pleural effusions with compressive atelectasis. Mild smooth septal thickening  and ground-glass at the lung bases, suggesting pulmonary edema. 3. Mild mediastinal and bilateral hilar adenopathy, nonspecific. This may be reactive. Recommend chest CT follow-up after resolution of symptoms for further assessment. 4. Exophytic hyperdense lesion from the posterior right kidney, incompletely characterized on this exam. Recommend nonemergent renal protocol MRI for characterization. Aortic Atherosclerosis (ICD10-I70.0). Electronically Signed   By: Andrea Gasman M.D.   On: 03/27/2024 22:27   DG Chest 2 View Result Date: 03/27/2024 CLINICAL DATA:  Shortness of breath EXAM: CHEST - 2 VIEW COMPARISON:  Chest x-ray 10/06/2023 FINDINGS: There is minimal bibasilar atelectasis. There  is no lung consolidation, pleural effusion or pneumothorax. Cardiomediastinal silhouette is within normal limits. No acute fractures. IMPRESSION: Minimal bibasilar atelectasis. Electronically Signed   By: Greig Pique M.D.   On: 03/27/2024 19:24    Microbiology: Results for orders placed or performed during the hospital encounter of 04/28/19  SARS Coronavirus 2 [facility] order, Performed in [facility] hospital lab) Nasopharyngeal Nasopharyngeal Swab     Status: None   Collection Time: 04/29/19  6:18 AM   Specimen: Nasopharyngeal Swab  Result Value Ref Range Status   SARS Coronavirus 2 NEGATIVE NEGATIVE Final    Comment: (NOTE) If result is NEGATIVE SARS-CoV-2 target nucleic acids are NOT DETECTED. The SARS-CoV-2 RNA is generally detectable in upper and lower  respiratory specimens during the acute phase of infection. The lowest  concentration of SARS-CoV-2 viral copies this assay can detect is 250  copies / mL. A negative result does not preclude SARS-CoV-2 infection  and should not be used as the sole basis for treatment or other  patient management decisions.  A negative result may occur with  improper specimen collection / handling, submission of specimen other  than nasopharyngeal swab, presence of  viral mutation(s) within the  areas targeted by this assay, and inadequate number of viral copies  (<250 copies / mL). A negative result must be combined with clinical  observations, patient history, and epidemiological information. If result is POSITIVE SARS-CoV-2 target nucleic acids are DETECTED. The SARS-CoV-2 RNA is generally detectable in upper and lower  respiratory specimens dur ing the acute phase of infection.  Positive  results are indicative of active infection with SARS-CoV-2.  Clinical  correlation with patient history and other diagnostic information is  necessary to determine patient infection status.  Positive results do  not rule out bacterial infection or co-infection with other viruses. If result is PRESUMPTIVE POSTIVE SARS-CoV-2 nucleic acids MAY BE PRESENT.   A presumptive positive result was obtained on the submitted specimen  and confirmed on repeat testing.  While 2019 novel coronavirus  (SARS-CoV-2) nucleic acids may be present in the submitted sample  additional confirmatory testing may be necessary for epidemiological  and / or clinical management purposes  to differentiate between  SARS-CoV-2 and other Sarbecovirus currently known to infect humans.  If clinically indicated additional testing with an alternate test  methodology [phone]) is advised. The SARS-CoV-2 RNA is generally  detectable in upper and lower respiratory sp ecimens during the acute  phase of infection. The expected result is Negative. Fact Sheet for Patients:  BoilerBrush.com.cy Fact Sheet for Healthcare Providers: https://pope.com/ This test is not yet approved or cleared by the [place]  FDA and has been authorized for detection and/or diagnosis of SARS-CoV-2 by FDA under an Emergency Use Authorization (EUA).  This EUA will remain in effect (meaning this test can be used) for the duration of the COVID-19 declaration under Section  564(b)(1) of the Act, 21 U.S.C. section 360bbb-3(b)(1), unless the authorization is terminated or revoked sooner. Performed at [facility], [address] [place], [place] [address]     Labs: CBC: Recent Labs  Lab 03/27/24 1845  WBC 10.7*  HGB 14.5  HCT 44.0  MCV 87.5  PLT 277   Basic Metabolic Panel: Recent Labs  Lab 03/27/24 1845 03/28/24 0345 03/29/24 0517  NA 139 138 140  K 4.1 3.9 3.6  CL 100 101 97*  CO2 28 28 30   GLUCOSE 144* 62* 118*  BUN 16 19 20   CREATININE 1.50* 1.40* 1.42*  CALCIUM  9.0 8.2* 8.6*   Liver Function Tests: No results for input(s): AST, ALT, ALKPHOS, BILITOT, PROT, ALBUMIN in the last 168 hours. CBG: Recent Labs  Lab 03/28/24 1206 03/28/24 1609 03/28/24 2105 03/29/24 0730 03/29/24 1142  GLUCAP 243* 110* 111* 184* 86    Discharge time spent: greater than 30 minutes.  This record has been created using Conservation officer, historic buildings. Errors have been sought and corrected,but may not always be located. Such creation errors do not reflect on the standard of care.   Signed: Amaryllis Dare, MD [facility] 03/29/2024
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
## 2024-03-29 NOTE — ED Notes (Signed)
 Pt back from MR, D/C pending once pharmacy dispenses meds.

## 2024-03-29 NOTE — ED Notes (Signed)
 This RN went over d/c paperwork and medications. No needs expressed. All belongings returned and placed in pt bag.

## 2024-04-01 DIAGNOSIS — Z09 Encounter for follow-up examination after completed treatment for conditions other than malignant neoplasm: Principal | ICD-10-CM

## 2024-04-02 DIAGNOSIS — F250 Schizoaffective disorder, bipolar type: Principal | ICD-10-CM

## 2024-04-02 DIAGNOSIS — E1142 Type 2 diabetes mellitus with diabetic polyneuropathy: Principal | ICD-10-CM

## 2024-04-02 DIAGNOSIS — G8929 Other chronic pain: Principal | ICD-10-CM

## 2024-04-02 DIAGNOSIS — I10 Essential (primary) hypertension: Principal | ICD-10-CM

## 2024-04-02 DIAGNOSIS — M5441 Lumbago with sciatica, right side: Principal | ICD-10-CM

## 2024-04-02 DIAGNOSIS — Z789 Other specified health status: Principal | ICD-10-CM

## 2024-04-05 ENCOUNTER — Encounter: Admitting: Family

## 2024-04-09 DIAGNOSIS — E1142 Type 2 diabetes mellitus with diabetic polyneuropathy: Principal | ICD-10-CM

## 2024-04-09 DIAGNOSIS — F250 Schizoaffective disorder, bipolar type: Principal | ICD-10-CM

## 2024-04-09 DIAGNOSIS — Z789 Other specified health status: Principal | ICD-10-CM

## 2024-04-09 DIAGNOSIS — I5031 Acute diastolic (congestive) heart failure: Principal | ICD-10-CM

## 2024-04-10 ENCOUNTER — Encounter
Admit: 2024-04-10 | Discharge: 2024-04-10 | Payer: Medicare (Managed Care) | Attending: Student in an Organized Health Care Education/Training Program | Primary: Student in an Organized Health Care Education/Training Program

## 2024-04-10 DIAGNOSIS — M7662 Achilles tendinitis, left leg: Principal | ICD-10-CM

## 2024-04-10 DIAGNOSIS — N189 Chronic kidney disease, unspecified: Principal | ICD-10-CM

## 2024-04-10 DIAGNOSIS — I2510 Atherosclerotic heart disease of native coronary artery without angina pectoris: Principal | ICD-10-CM

## 2024-04-10 DIAGNOSIS — I5031 Acute diastolic (congestive) heart failure: Principal | ICD-10-CM

## 2024-04-10 DIAGNOSIS — I10 Essential (primary) hypertension: Principal | ICD-10-CM

## 2024-04-10 DIAGNOSIS — J029 Acute pharyngitis, unspecified: Principal | ICD-10-CM

## 2024-04-10 DIAGNOSIS — R2242 Localized swelling, mass and lump, left lower limb: Principal | ICD-10-CM

## 2024-04-10 DIAGNOSIS — E1142 Type 2 diabetes mellitus with diabetic polyneuropathy: Principal | ICD-10-CM

## 2024-04-10 MED ORDER — FUROSEMIDE 40 MG TABLET
ORAL_TABLET | Freq: Every day | ORAL | 3 refills | 90.00000 days | Status: CP
Start: 2024-04-10 — End: ?

## 2024-04-10 MED ORDER — METOPROLOL SUCCINATE ER 50 MG TABLET,EXTENDED RELEASE 24 HR
ORAL_TABLET | Freq: Every day | ORAL | 3 refills | 90.00000 days | Status: CP
Start: 2024-04-10 — End: ?

## 2024-04-10 MED ORDER — DEXCOM G6 TRANSMITTER DEVICE
4 refills | 0.00000 days | Status: CP
Start: 2024-04-10 — End: 2024-07-09

## 2024-04-10 MED ORDER — GABAPENTIN 300 MG CAPSULE
ORAL_CAPSULE | Freq: Two times a day (BID) | ORAL | 3 refills | 45.00000 days | Status: CP
Start: 2024-04-10 — End: ?

## 2024-04-10 MED ORDER — HYDRALAZINE 25 MG TABLET
ORAL_TABLET | Freq: Two times a day (BID) | ORAL | 3 refills | 90.00000 days | Status: CP
Start: 2024-04-10 — End: ?

## 2024-04-10 MED ORDER — DEXCOM G6 SENSOR DEVICE
Freq: Every day | 3 refills | 0.00000 days | Status: CP
Start: 2024-04-10 — End: 2024-07-09

## 2024-04-10 MED ORDER — DEXCOM G6 RECEIVER
Freq: Every day | 0 refills | 0.00000 days | Status: CP
Start: 2024-04-10 — End: ?

## 2024-04-10 MED ORDER — LOSARTAN 100 MG TABLET
ORAL_TABLET | Freq: Every day | ORAL | 3 refills | 100.00000 days | Status: CP
Start: 2024-04-10 — End: ?

## 2024-04-10 MED ORDER — METFORMIN 1,000 MG TABLET
ORAL_TABLET | Freq: Two times a day (BID) | ORAL | 3 refills | 90.00000 days | Status: CP
Start: 2024-04-10 — End: ?

## 2024-04-10 MED ORDER — ASPIRIN 81 MG TABLET,DELAYED RELEASE
ORAL_TABLET | Freq: Every day | ORAL | 3 refills | 90.00000 days | Status: CP
Start: 2024-04-10 — End: ?

## 2024-04-10 MED ORDER — ATORVASTATIN 80 MG TABLET
ORAL_TABLET | Freq: Every day | ORAL | 3 refills | 90.00000 days | Status: CP
Start: 2024-04-10 — End: ?

## 2024-04-10 MED ORDER — GLIPIZIDE 5 MG TABLET
ORAL_TABLET | Freq: Two times a day (BID) | ORAL | 3 refills | 90.00000 days | Status: CP
Start: 2024-04-10 — End: ?

## 2024-04-10 MED ORDER — AMLODIPINE 5 MG TABLET
ORAL_TABLET | Freq: Every day | ORAL | 3 refills | 90.00000 days | Status: CP
Start: 2024-04-10 — End: ?

## 2024-04-10 MED ORDER — EMPAGLIFLOZIN 25 MG TABLET
ORAL_TABLET | Freq: Every morning | ORAL | 3 refills | 90.00000 days | Status: CP
Start: 2024-04-10 — End: 2024-07-09

## 2024-04-16 DIAGNOSIS — E1142 Type 2 diabetes mellitus with diabetic polyneuropathy: Principal | ICD-10-CM

## 2024-04-16 DIAGNOSIS — I10 Essential (primary) hypertension: Principal | ICD-10-CM

## 2024-04-16 DIAGNOSIS — I5031 Acute diastolic (congestive) heart failure: Principal | ICD-10-CM

## 2024-04-16 DIAGNOSIS — F250 Schizoaffective disorder, bipolar type: Principal | ICD-10-CM

## 2024-04-16 DIAGNOSIS — Z789 Other specified health status: Principal | ICD-10-CM

## 2024-05-06 ENCOUNTER — Other Ambulatory Visit: Payer: Self-pay

## 2024-05-08 DIAGNOSIS — I5031 Acute diastolic (congestive) heart failure: Principal | ICD-10-CM

## 2024-05-08 DIAGNOSIS — F250 Schizoaffective disorder, bipolar type: Principal | ICD-10-CM

## 2024-05-08 DIAGNOSIS — Z789 Other specified health status: Principal | ICD-10-CM

## 2024-05-08 DIAGNOSIS — E1142 Type 2 diabetes mellitus with diabetic polyneuropathy: Principal | ICD-10-CM

## 2024-05-10 DIAGNOSIS — I5031 Acute diastolic (congestive) heart failure: Principal | ICD-10-CM

## 2024-05-10 DIAGNOSIS — E1142 Type 2 diabetes mellitus with diabetic polyneuropathy: Principal | ICD-10-CM

## 2024-05-13 DIAGNOSIS — E1142 Type 2 diabetes mellitus with diabetic polyneuropathy: Principal | ICD-10-CM

## 2024-05-13 DIAGNOSIS — I5031 Acute diastolic (congestive) heart failure: Principal | ICD-10-CM

## 2024-05-13 DIAGNOSIS — F250 Schizoaffective disorder, bipolar type: Principal | ICD-10-CM

## 2024-05-13 DIAGNOSIS — Z789 Other specified health status: Principal | ICD-10-CM

## 2024-05-13 DIAGNOSIS — I10 Essential (primary) hypertension: Principal | ICD-10-CM

## 2024-05-14 DIAGNOSIS — I10 Essential (primary) hypertension: Principal | ICD-10-CM

## 2024-05-14 DIAGNOSIS — I5031 Acute diastolic (congestive) heart failure: Principal | ICD-10-CM

## 2024-05-14 DIAGNOSIS — E1142 Type 2 diabetes mellitus with diabetic polyneuropathy: Principal | ICD-10-CM

## 2024-05-15 ENCOUNTER — Ambulatory Visit: Admit: 2024-05-15 | Payer: Medicare (Managed Care) | Attending: Family | Primary: Family

## 2024-06-06 DIAGNOSIS — E119 Type 2 diabetes mellitus without complications: Principal | ICD-10-CM

## 2024-06-06 DIAGNOSIS — E1142 Type 2 diabetes mellitus with diabetic polyneuropathy: Principal | ICD-10-CM

## 2024-06-06 MED ORDER — BLOOD-GLUCOSE METER KIT WRAPPER
0 refills | 0.00000 days
Start: 2024-06-06 — End: 2025-06-06

## 2024-06-17 MED ORDER — BLOOD-GLUCOSE METER KIT WRAPPER
ORAL | 0 refills | 0.00000 days | Status: CP
Start: 2024-06-17 — End: 2025-06-17

## 2024-06-17 MED ORDER — ACCU-CHEK GUIDE TEST STRIPS
ORAL_STRIP | SUBCUTANEOUS | 3 refills | 0.00000 days | Status: CP
Start: 2024-06-17 — End: ?

## 2024-06-17 MED ORDER — LANCETS
3 refills | 0.00000 days | Status: CP
Start: 2024-06-17 — End: ?

## 2024-06-19 MED ORDER — DIVALPROEX ER 500 MG TABLET,EXTENDED RELEASE 24 HR
ORAL_TABLET | Freq: Every evening | ORAL | 11 refills | 30.00000 days | Status: CP
Start: 2024-06-19 — End: ?

## 2024-07-15 DIAGNOSIS — F250 Schizoaffective disorder, bipolar type: Principal | ICD-10-CM

## 2024-07-15 MED ORDER — ARIPIPRAZOLE 15 MG TABLET
ORAL_TABLET | Freq: Every day | ORAL | 0 refills | 30.00000 days
Start: 2024-07-15 — End: 2024-08-14

## 2024-07-17 ENCOUNTER — Encounter
Admit: 2024-07-17 | Discharge: 2024-07-17 | Payer: Medicare (Managed Care) | Attending: Student in an Organized Health Care Education/Training Program | Primary: Student in an Organized Health Care Education/Training Program

## 2024-07-17 DIAGNOSIS — L97529 Non-pressure chronic ulcer of other part of left foot with unspecified severity: Principal | ICD-10-CM

## 2024-07-17 DIAGNOSIS — I5032 Chronic diastolic (congestive) heart failure: Principal | ICD-10-CM

## 2024-07-17 DIAGNOSIS — E1139 Type 2 diabetes mellitus with other diabetic ophthalmic complication: Principal | ICD-10-CM

## 2024-07-18 MED ORDER — ARIPIPRAZOLE 15 MG TABLET
ORAL_TABLET | Freq: Every day | ORAL | 11 refills | 30.00000 days | Status: CP
Start: 2024-07-18 — End: ?

## 2024-08-15 DIAGNOSIS — E1139 Type 2 diabetes mellitus with other diabetic ophthalmic complication: Principal | ICD-10-CM
